# Patient Record
Sex: Male | Born: 1935 | Race: White | Hispanic: No | Smoking: Former smoker
Health system: Southern US, Community
[De-identification: ages and names within clinical notes are randomized; demographics above are authoritative.]

## PROBLEM LIST (undated history)

## (undated) DIAGNOSIS — N390 Urinary tract infection, site not specified: Secondary | ICD-10-CM

## (undated) DIAGNOSIS — L8990 Pressure ulcer of unspecified site, unspecified stage: Secondary | ICD-10-CM

## (undated) DIAGNOSIS — I10 Essential (primary) hypertension: Secondary | ICD-10-CM

## (undated) DIAGNOSIS — M6281 Muscle weakness (generalized): Secondary | ICD-10-CM

## (undated) DIAGNOSIS — C679 Malignant neoplasm of bladder, unspecified: Secondary | ICD-10-CM

## (undated) HISTORY — PX: OTHER SURGICAL HISTORY: SHX169

## (undated) HISTORY — PX: ILEAL POUCH: SHX5856

---

## 2016-05-29 ENCOUNTER — Encounter: Payer: Self-pay | Admitting: Emergency Medicine

## 2016-05-29 ENCOUNTER — Emergency Department
Admission: EM | Admit: 2016-05-29 | Discharge: 2016-05-29 | Disposition: A | Discharge status: Home / Self Care | Payer: Medicare Other | Attending: Student in an Organized Health Care Education/Training Program | Admitting: Student in an Organized Health Care Education/Training Program

## 2016-05-29 ENCOUNTER — Emergency Department: Payer: Medicare Other

## 2016-05-29 DIAGNOSIS — Z87891 Personal history of nicotine dependence: Secondary | ICD-10-CM | POA: Insufficient documentation

## 2016-05-29 DIAGNOSIS — R079 Chest pain, unspecified: Secondary | ICD-10-CM

## 2016-05-29 DIAGNOSIS — I10 Essential (primary) hypertension: Secondary | ICD-10-CM | POA: Diagnosis not present

## 2016-05-29 DIAGNOSIS — Z7982 Long term (current) use of aspirin: Secondary | ICD-10-CM | POA: Diagnosis not present

## 2016-05-29 DIAGNOSIS — R935 Abnormal findings on diagnostic imaging of other abdominal regions, including retroperitoneum: Secondary | ICD-10-CM

## 2016-05-29 DIAGNOSIS — R1011 Right upper quadrant pain: Secondary | ICD-10-CM

## 2016-05-29 DIAGNOSIS — R0789 Other chest pain: Secondary | ICD-10-CM | POA: Insufficient documentation

## 2016-05-29 DIAGNOSIS — Z79899 Other long term (current) drug therapy: Secondary | ICD-10-CM | POA: Diagnosis not present

## 2016-05-29 DIAGNOSIS — R410 Disorientation, unspecified: Secondary | ICD-10-CM | POA: Diagnosis present

## 2016-05-29 DIAGNOSIS — Z8551 Personal history of malignant neoplasm of bladder: Secondary | ICD-10-CM | POA: Diagnosis not present

## 2016-05-29 DIAGNOSIS — N390 Urinary tract infection, site not specified: Secondary | ICD-10-CM | POA: Diagnosis not present

## 2016-05-29 HISTORY — DX: Essential (primary) hypertension: I10

## 2016-05-29 HISTORY — DX: Urinary tract infection, site not specified: N39.0

## 2016-05-29 HISTORY — DX: Malignant neoplasm of bladder, unspecified: C67.9

## 2016-05-29 HISTORY — DX: Muscle weakness (generalized): M62.81

## 2016-05-29 LAB — URINALYSIS COMPLETE WITH MICROSCOPIC (ARMC ONLY)
BILIRUBIN URINE: NEGATIVE
GLUCOSE, UA: NEGATIVE mg/dL
Hgb urine dipstick: NEGATIVE
KETONES UR: NEGATIVE mg/dL
NITRITE: NEGATIVE
Protein, ur: NEGATIVE mg/dL
Specific Gravity, Urine: 1.011 (ref 1.005–1.030)
Squamous Epithelial / LPF: NONE SEEN
pH: 7 (ref 5.0–8.0)

## 2016-05-29 LAB — COMPREHENSIVE METABOLIC PANEL
ALT: 16 U/L — ABNORMAL LOW (ref 17–63)
AST: 19 U/L (ref 15–41)
Albumin: 3.7 g/dL (ref 3.5–5.0)
Alkaline Phosphatase: 58 U/L (ref 38–126)
Anion gap: 7 (ref 5–15)
BUN: 35 mg/dL — AB (ref 6–20)
CHLORIDE: 104 mmol/L (ref 101–111)
CO2: 28 mmol/L (ref 22–32)
CREATININE: 1.03 mg/dL (ref 0.61–1.24)
Calcium: 8.6 mg/dL — ABNORMAL LOW (ref 8.9–10.3)
Glucose, Bld: 132 mg/dL — ABNORMAL HIGH (ref 65–99)
POTASSIUM: 3.9 mmol/L (ref 3.5–5.1)
Sodium: 139 mmol/L (ref 135–145)
TOTAL PROTEIN: 6.7 g/dL (ref 6.5–8.1)

## 2016-05-29 LAB — TROPONIN I: Troponin I: <0.03 ng/mL (ref <0.03)

## 2016-05-29 LAB — CBC WITH DIFFERENTIAL/PLATELET
Basophils Absolute: 0.1 10*3/uL (ref 0–0.1)
Basophils Relative: 1 %
EOS PCT: 2 %
Eosinophils Absolute: 0.3 10*3/uL (ref 0–0.7)
HEMATOCRIT: 36.6 % — AB (ref 40.0–52.0)
Hemoglobin: 12.7 g/dL — ABNORMAL LOW (ref 13.0–18.0)
LYMPHS ABS: 2.5 10*3/uL (ref 1.0–3.6)
LYMPHS PCT: 22 %
MCH: 31.9 pg (ref 26.0–34.0)
MCHC: 34.6 g/dL (ref 32.0–36.0)
MCV: 92.1 fL (ref 80.0–100.0)
MONO ABS: 0.9 10*3/uL (ref 0.2–1.0)
MONOS PCT: 8 %
NEUTROS ABS: 7.7 10*3/uL — AB (ref 1.4–6.5)
Neutrophils Relative %: 67 %
PLATELETS: 210 10*3/uL (ref 150–440)
RBC: 3.97 MIL/uL — ABNORMAL LOW (ref 4.40–5.90)
RDW: 14.8 % — AB (ref 11.5–14.5)
WBC: 11.5 10*3/uL — ABNORMAL HIGH (ref 3.8–10.6)

## 2016-05-29 MED ORDER — CEFTRIAXONE SODIUM-DEXTROSE 1-3.74 GM-% IV SOLR
1.0000 g | Freq: Once | INTRAVENOUS | Status: AC
  Administered 2016-05-29: 1 g via INTRAVENOUS
  Filled 2016-05-29: qty 50

## 2016-05-29 MED ORDER — DEXTROSE 5 % IV SOLN: 1.0000 g | Freq: Once | INTRAVENOUS | Status: DC

## 2016-05-29 MED ORDER — SULFAMETHOXAZOLE-TRIMETHOPRIM 800-160 MG PO TABS
1.0000 | ORAL_TABLET | Freq: Two times a day (BID) | ORAL | 0 refills | Status: AC
Start: 2016-05-29 — End: 2016-06-05

## 2016-05-29 MED ORDER — IOPAMIDOL (ISOVUE-300) INJECTION 61%
85.0000 mL | Freq: Once | INTRAVENOUS | Status: AC | PRN
  Administered 2016-05-29: 85 mL via INTRAVENOUS

## 2016-05-29 MED ORDER — HALOPERIDOL 1 MG PO TABS
1.0000 mg | ORAL_TABLET | Freq: Once | ORAL | Status: AC
  Administered 2016-05-29: 1 mg via ORAL
  Filled 2016-05-29: qty 1

## 2016-05-29 MED ORDER — IOPAMIDOL (ISOVUE-300) INJECTION 61%
30.0000 mL | Freq: Once | INTRAVENOUS | Status: AC | PRN
  Administered 2016-05-29: 30 mL via ORAL

## 2016-05-29 NOTE — ED Provider Notes (Signed)
[facility] Emergency Department Provider Note    First MD Initiated Contact with Patient 05/29/16 [phone]     (approximate)  I have reviewed the triage vital signs and the nursing notes.   HISTORY  Chief Complaint Chest Pain  Level V Caveat:  confusion HPI Victor Parks is a 82 y.o. male  presents from nursing facility with complaint of intermittent right-sided chest pain and discomfort over the past day or 2. Patient does appear mildly confused stating that he is uncertain as to why he is in the ER. At this point he denies any discomfort right now. Denies any shortness of breath. Denies any nausea. No dysuria. No flank pain.   Past Medical History:  Diagnosis Date  . Bladder cancer ([place])   . Hypertension   . Muscle weakness   . UTI (urinary tract infection)     There are no active problems to display for this patient.   History reviewed. No pertinent surgical history.  Prior to Admission medications   Not on File    Allergies Ciprofloxacin  History reviewed. No pertinent family history.  Social History Social History  Substance Use Topics  . Smoking status: Former Research scientist (life sciences)  . Smokeless tobacco: Never Used  . Alcohol use Not on file    Review of Systems Patient denies headaches, rhinorrhea, blurry vision, numbness, shortness of breath, chest pain, edema, cough, abdominal pain, nausea, vomiting, diarrhea, dysuria, fevers, rashes or hallucinations unless otherwise stated above in HPI. ____________________________________________   PHYSICAL EXAM:  VITAL SIGNS: Vitals:   05/29/16 0521  BP: (!) 131/55  Pulse: 70  Resp: 18  Temp: 97.6 F (36.4 C)    Constitutional: Alert, Elderly and chronically ill appearing Eyes: Conjunctivae are normal. PERRL. EOMI. Head: Atraumatic. Nose: No congestion/rhinnorhea. Mouth/Throat: Mucous membranes are moist.  Oropharynx non-erythematous. Neck: No stridor. Painless ROM. No cervical spine  tenderness to palpation Hematological/Lymphatic/Immunilogical: No cervical lymphadenopathy. Cardiovascular: Normal rate, regular rhythm. Grossly normal heart sounds.  Good peripheral circulation. Respiratory: Normal respiratory effort.  No retractions. Lungs CTAB. Gastrointestinal: Soft and nontender. Urostomy bag inplace, urine appears clear, stoma appears pink and well perfused.  No distention. No abdominal bruits. No CVA tenderness. Musculoskeletal: No lower extremity tenderness nor edema.  No joint effusions. Neurologic:  Normal speech and language. No gross focal neurologic deficits are appreciated. No gait instability.  ____________________________________________   LABS (all labs ordered are listed, but only abnormal results are displayed)  Results for orders placed or performed during the hospital encounter of 05/29/16 (from the past 24 hour(s))  CBC with Differential/Platelet     Status: Abnormal   Collection Time: 05/29/16  5:29 AM  Result Value Ref Range   WBC 11.5 (H) 3.8 - 10.6 K/uL   RBC 3.97 (L) 4.40 - 5.90 MIL/uL   Hemoglobin 12.7 (L) 13.0 - 18.0 g/dL   HCT 36.6 (L) 40.0 - 52.0 %   MCV 92.1 80.0 - 100.0 fL   MCH 31.9 26.0 - 34.0 pg   MCHC 34.6 32.0 - 36.0 g/dL   RDW 14.8 (H) 11.5 - 14.5 %   Platelets 210 150 - 440 K/uL   Neutrophils Relative % 67 %   Neutro Abs 7.7 (H) 1.4 - 6.5 K/uL   Lymphocytes Relative 22 %   Lymphs Abs 2.5 1.0 - 3.6 K/uL   Monocytes Relative 8 %   Monocytes Absolute 0.9 0.2 - 1.0 K/uL   Eosinophils Relative 2 %   Eosinophils Absolute 0.3 0 - 0.7 K/uL  Basophils Relative 1 %   Basophils Absolute 0.1 0 - 0.1 K/uL  Comprehensive metabolic panel     Status: Abnormal   Collection Time: 05/29/16  5:29 AM  Result Value Ref Range   Sodium 139 135 - 145 mmol/L   Potassium 3.9 3.5 - 5.1 mmol/L   Chloride 104 101 - 111 mmol/L   CO2 28 22 - 32 mmol/L   Glucose, Bld 132 (H) 65 - 99 mg/dL   BUN 35 (H) 6 - 20 mg/dL   Creatinine, Ser 1.03 0.61 -  1.24 mg/dL   Calcium 8.6 (L) 8.9 - 10.3 mg/dL   Total Protein 6.7 6.5 - 8.1 g/dL   Albumin 3.7 3.5 - 5.0 g/dL   AST 19 15 - 41 U/L   ALT 16 (L) 17 - 63 U/L   Alkaline Phosphatase 58 38 - 126 U/L   Total Bilirubin <0.1 (L) 0.3 - 1.2 mg/dL   GFR calc non Af Amer >60 >60 mL/min   GFR calc Af Amer >60 >60 mL/min   Anion gap 7 5 - 15   ____________________________________________  EKG My review and personal interpretation at Time: 5:18   Indication: chest pain  Rate: 65  Rhythm: sinus Axis: normal Other: RBBB, no acute ST elevations or depressions ____________________________________________  RADIOLOGY  I personally reviewed all radiographic images ordered to evaluate for the above acute complaints and reviewed radiology reports and findings.  These findings were personally discussed with the patient.  Please see medical record for radiology report. ____________________________________________   PROCEDURES  Procedure(s) performed: none    Critical Care performed: no ____________________________________________   INITIAL IMPRESSION / ASSESSMENT AND PLAN / ED COURSE  Pertinent labs & imaging results that were available during my care of the patient were reviewed by me and considered in my medical decision making (see chart for details).  DDX:  Acs, cholelithiasis, hepatitis, hernia, gastritis, pna  Victor Parks is a 82 y.o. who presents to the ED with reported right upper quadrant and right-sided chest pain and discomfort. Patient arrives to the ER without any complaints. EKG shows no evidence of acute ischemia. He is otherwise in no acute distress. His abdominal exam does show evidence of urostomy with no evidence of a stomal swelling or tenderness. The stoma appears well-perfused.  Abdominal exam is nontender at this time. Based on his complaints, confusion and age with comorbidities will order bride laboratory evaluation for the above complaints.  The patient will be  placed on continuous pulse oximetry and telemetry for monitoring.  Laboratory evaluation will be sent to evaluate for the above complaints.     Clinical Course  ----------------------------------------- 8:19 AM on 05/29/2016 -----------------------------------------  CT imaging shows no evidence of acute intra-abdominal pathology. He does have multiple nodules in the liver as well as kidney but there is no evidence of obstructive process. Gallbladder is unremarkable. On reassessment the patient denies any pain or nausea. Blood work otherwise reassuring. Currently awaiting repeat troponin. As well as urinalysis. Patient will be signed out to Dr. Alfred Levins pending repeat lab results. If normal feel patient is currently stable for discharge back to facility.      ____________________________________________   FINAL CLINICAL IMPRESSION(S) / ED DIAGNOSES  Final diagnoses:  Chest pain, unspecified type  Abdominal pain, RUQ  Abnormal CT of the abdomen      NEW MEDICATIONS STARTED DURING THIS VISIT:  New Prescriptions   No medications on file     Note:  This document was prepared using  Dragon Armed forces training and education officer and may include unintentional dictation errors.    Merlyn Lot, MD 05/29/16 [phone]

## 2016-05-29 NOTE — ED Notes (Addendum)
Pt's daughter, Vaughan Basta, was informed of results of workup and notified that he would be returning to [place]. [facility] facility was called and report was given to Santiago Glad. Per Livia Snellen is unable to transport patient.

## 2016-05-29 NOTE — ED Notes (Signed)
Victor Parks (daugther) cell:  [phone]

## 2016-05-29 NOTE — ED Provider Notes (Signed)
----------------------------------------- 10:17 AM on 05/29/2016 -----------------------------------------   Blood pressure (!) 102/52, pulse 69, temperature 97.6 F (36.4 C), temperature source Oral, resp. rate 17, height 5' 9.5" (1.765 m), weight 154 lb (69.9 kg), SpO2 97 %.  Assuming care from Dr. Quentin Cornwall of [facility] is a 82 y.o. male with a chief complaint of Chest Pain .    In summary, 89M presenting with right-sided abdominal pain. Exam is reassuring with no tenderness. Blood work showing mild leukocytosis with white count of 11.3. Dr. Quentin Cornwall requests that a follow-up the results of second troponin, CT abdomen and pelvis, and urinalysis.  Second troponin remains negative. Patient has no complaints. UA is positive for urinary tract infection and with a white count of 11.5 patient was given ceftriaxone and will be discharged back to his facility on Bactrim. CT with no acute findings.   CT Abdomen Pelvis W Contrast (Final result)  Result time 05/29/16 08:05:18  Final result by Lavonia Dana, MD (05/29/16 08:05:18)           Narrative:   CLINICAL DATA: RIGHT chest, RIGHT upper quadrant, epigastric abdominal pain/ discomfort beginning last night, history of bladder cancer post cystectomy and urostomy, leukocytosis, anemia  EXAM: CT ABDOMEN AND PELVIS WITH CONTRAST  TECHNIQUE: Multidetector CT imaging of the abdomen and pelvis was performed using the standard protocol following bolus administration of intravenous contrast. Sagittal and coronal MPR images reconstructed from axial data set.  CONTRAST: 83mL ISOVUE-300 IOPAMIDOL (ISOVUE-300) INJECTION 61% IV. Dilute oral contrast.  COMPARISON: None  FINDINGS: Lower chest: Bibasilar emphysematous changes  Hepatobiliary: Contracted gallbladder. Nonspecific 5 mm low-attenuation lesion RIGHT lobe liver image 22. Remainder of liver unremarkable.  Pancreas: Normal appearance  Spleen: Normal  appearance  Adrenals/Urinary Tract: Small BILATERAL intermediate attenuation renal lesions as well as a small probable RIGHT renal cyst. Unremarkable adrenal glands. Fullness of BILATERAL renal collecting systems and ureters extending to RIGHT lower quadrant urostomy which abuts and indents the RIGHT colon. Post cystectomy.  Stomach/Bowel: Nonvisualization of appendix. Small bowel anastomosis in RIGHT mid abdomen without bowel obstruction. Increased stool in rectum. Distended stomach. Remaining bowel loops unremarkable.  Vascular/Lymphatic: Atherosclerotic calcifications aorta and iliac arteries. Plaque at origins of the celiac artery and SMA with less than 50% diameter narrowing. No adenopathy.  Reproductive: N/A  Other: No free air or free fluid.  Musculoskeletal: Diffuse osseous demineralization. Orthopedic hardware proximal RIGHT femur. Compression fractures of the superior endplates of L1 and L3, appear old. Multilevel disc space narrowing and endplate spur formation with vacuum phenomenon. RIGHT paracentral disc protrusion L5-S1 likely exerting mass effect upon the RIGHT S1 root. RIGHT neural foraminal stenosis L3-L4 with milder degrees of neural foraminal narrowing at additional levels bilaterally.  IMPRESSION: RIGHT lower quadrant urostomy without acute abnormality.  Increased stool in rectum.  Nonspecific small lesions within the liver and kidneys ; if patient has any prior outside exams these would be of benefit in establishing stability. In the absence of prior exams either MR characterization or followup imaging to assess stability would be recommended.  Question COPD.  Aortic atherosclerosis.  No definite acute intra-abdominal or intrapelvic abnormalities.  Significant degenerative disc disease changes of the lumbar spine as above with compression fractures of L1 and L3 as well as RIGHT paracentral disc herniation at L5-S1 likely exerting mass effect upon  the RIGHT S1 root.   Electronically Signed By: Lavonia Dana M.D. On: 05/29/2016 08:05                Rudene Re,  MD 05/29/16 1019

## 2016-05-29 NOTE — ED Notes (Signed)
Patient transported to CT 

## 2016-05-29 NOTE — ED Notes (Signed)
Pt having some confusion. Reports concern that his family is not here.  Per outgoing report, family is aware that patient is here and that his wife is the one to have called [phone].  Daughter, Vaughan Basta was called at this time to talk with patient and reassure him.

## 2016-05-29 NOTE — ED Notes (Signed)
Return from CT

## 2016-05-29 NOTE — ED Notes (Signed)
Report given to grace, RN

## 2016-05-29 NOTE — ED Triage Notes (Signed)
Pt presents to ED via [facility] with c/o right sided chest pain/discomfort, onset last night. Pt alerts and oriented x3 at this time. No signs of distress noted. Pt from [place].

## 2016-05-29 NOTE — ED Notes (Signed)
Pt pulling at IV. Taking off gown.  Pt redirected.

## 2016-05-29 NOTE — ED Notes (Signed)
Delay in d/c. Awaiting paperwork.

## 2016-05-29 NOTE — Discharge Instructions (Signed)
°  TECHNIQUE:  Multidetector CT imaging of the abdomen and pelvis was performed  using the standard protocol following bolus administration of  intravenous contrast. Sagittal and coronal MPR images reconstructed  from axial data set.     CONTRAST:  47mL ISOVUE-300 IOPAMIDOL (ISOVUE-300) INJECTION 61% IV.  Dilute oral contrast.     COMPARISON:  None     FINDINGS:  Lower chest: Bibasilar emphysematous changes     Hepatobiliary: Contracted gallbladder. Nonspecific 5 mm  low-attenuation lesion RIGHT lobe liver image 22. Remainder of liver  unremarkable.     Pancreas: Normal appearance     Spleen: Normal appearance     Adrenals/Urinary Tract: Small BILATERAL intermediate attenuation  renal lesions as well as a small probable RIGHT renal cyst.  Unremarkable adrenal glands. Fullness of BILATERAL renal collecting  systems and ureters extending to RIGHT lower quadrant urostomy which  abuts and indents the RIGHT colon. Post cystectomy.     Stomach/Bowel: Nonvisualization of appendix. Small bowel anastomosis  in RIGHT mid abdomen without bowel obstruction. Increased stool in  rectum. Distended stomach. Remaining bowel loops unremarkable.     Vascular/Lymphatic: Atherosclerotic calcifications aorta and iliac  arteries. Plaque at origins of the celiac artery and SMA with less  than 50% diameter narrowing. No adenopathy.     Reproductive: N/A     Other: No free air or free fluid.     Musculoskeletal: Diffuse osseous demineralization. Orthopedic  hardware proximal RIGHT femur. Compression fractures of the superior  endplates of L1 and L3, appear old. Multilevel disc space narrowing  and endplate spur formation with vacuum phenomenon. RIGHT  paracentral disc protrusion L5-S1 likely exerting mass effect upon  the RIGHT S1 root. RIGHT neural foraminal stenosis L3-L4 with milder  degrees of neural foraminal narrowing at additional levels  bilaterally.     IMPRESSION:  RIGHT lower  quadrant urostomy without acute abnormality.     Increased stool in rectum.     Nonspecific small lesions within the liver and kidneys ; if patient  has any prior outside exams these would be of benefit in  establishing stability. In the absence of prior exams either MR  characterization or followup imaging to assess stability would be  recommended.     Question COPD.     Aortic atherosclerosis.     No definite acute intra-abdominal or intrapelvic abnormalities.     Significant degenerative disc disease changes of the lumbar spine as  above with compression fractures of L1 and L3 as well as RIGHT  paracentral disc herniation at L5-S1 likely exerting mass effect  upon the RIGHT S1 root.

## 2016-05-30 LAB — URINE CULTURE

## 2016-06-13 ENCOUNTER — Inpatient Hospital Stay
Admission: EM | Admit: 2016-06-13 | Discharge: 2016-06-22 | DRG: 871 | Disposition: A | Discharge status: Skilled Nursing Facility | Payer: Medicare Other | Attending: Internal Medicine | Admitting: Internal Medicine

## 2016-06-13 ENCOUNTER — Encounter: Payer: Self-pay | Admitting: Emergency Medicine

## 2016-06-13 ENCOUNTER — Observation Stay: Payer: Medicare Other

## 2016-06-13 ENCOUNTER — Emergency Department: Payer: Medicare Other

## 2016-06-13 DIAGNOSIS — F17210 Nicotine dependence, cigarettes, uncomplicated: Secondary | ICD-10-CM | POA: Diagnosis present

## 2016-06-13 DIAGNOSIS — A4189 Other specified sepsis: Secondary | ICD-10-CM | POA: Diagnosis present

## 2016-06-13 DIAGNOSIS — J209 Acute bronchitis, unspecified: Secondary | ICD-10-CM

## 2016-06-13 DIAGNOSIS — Z79899 Other long term (current) drug therapy: Secondary | ICD-10-CM

## 2016-06-13 DIAGNOSIS — R6521 Severe sepsis with septic shock: Secondary | ICD-10-CM | POA: Diagnosis present

## 2016-06-13 DIAGNOSIS — A419 Sepsis, unspecified organism: Secondary | ICD-10-CM

## 2016-06-13 DIAGNOSIS — I10 Essential (primary) hypertension: Secondary | ICD-10-CM | POA: Diagnosis present

## 2016-06-13 DIAGNOSIS — Z66 Do not resuscitate: Secondary | ICD-10-CM | POA: Diagnosis present

## 2016-06-13 DIAGNOSIS — A4181 Sepsis due to Enterococcus: Secondary | ICD-10-CM | POA: Diagnosis not present

## 2016-06-13 DIAGNOSIS — M7989 Other specified soft tissue disorders: Secondary | ICD-10-CM

## 2016-06-13 DIAGNOSIS — R7989 Other specified abnormal findings of blood chemistry: Secondary | ICD-10-CM | POA: Diagnosis present

## 2016-06-13 DIAGNOSIS — N179 Acute kidney failure, unspecified: Secondary | ICD-10-CM | POA: Diagnosis present

## 2016-06-13 DIAGNOSIS — R710 Precipitous drop in hematocrit: Secondary | ICD-10-CM

## 2016-06-13 DIAGNOSIS — K449 Diaphragmatic hernia without obstruction or gangrene: Secondary | ICD-10-CM | POA: Diagnosis present

## 2016-06-13 DIAGNOSIS — N390 Urinary tract infection, site not specified: Secondary | ICD-10-CM | POA: Diagnosis present

## 2016-06-13 DIAGNOSIS — R109 Unspecified abdominal pain: Secondary | ICD-10-CM

## 2016-06-13 DIAGNOSIS — D62 Acute posthemorrhagic anemia: Secondary | ICD-10-CM | POA: Diagnosis not present

## 2016-06-13 DIAGNOSIS — L8990 Pressure ulcer of unspecified site, unspecified stage: Secondary | ICD-10-CM | POA: Insufficient documentation

## 2016-06-13 DIAGNOSIS — K5909 Other constipation: Secondary | ICD-10-CM | POA: Diagnosis present

## 2016-06-13 DIAGNOSIS — Z8551 Personal history of malignant neoplasm of bladder: Secondary | ICD-10-CM

## 2016-06-13 DIAGNOSIS — R0600 Dyspnea, unspecified: Secondary | ICD-10-CM | POA: Diagnosis present

## 2016-06-13 DIAGNOSIS — K567 Ileus, unspecified: Secondary | ICD-10-CM

## 2016-06-13 DIAGNOSIS — I808 Phlebitis and thrombophlebitis of other sites: Secondary | ICD-10-CM | POA: Diagnosis not present

## 2016-06-13 DIAGNOSIS — E43 Unspecified severe protein-calorie malnutrition: Secondary | ICD-10-CM | POA: Diagnosis present

## 2016-06-13 DIAGNOSIS — M6281 Muscle weakness (generalized): Secondary | ICD-10-CM

## 2016-06-13 DIAGNOSIS — Z881 Allergy status to other antibiotic agents status: Secondary | ICD-10-CM

## 2016-06-13 DIAGNOSIS — K56609 Unspecified intestinal obstruction, unspecified as to partial versus complete obstruction: Secondary | ICD-10-CM | POA: Diagnosis present

## 2016-06-13 DIAGNOSIS — K2270 Barrett's esophagus without dysplasia: Secondary | ICD-10-CM | POA: Diagnosis present

## 2016-06-13 DIAGNOSIS — Z7982 Long term (current) use of aspirin: Secondary | ICD-10-CM

## 2016-06-13 DIAGNOSIS — L89152 Pressure ulcer of sacral region, stage 2: Secondary | ICD-10-CM | POA: Diagnosis present

## 2016-06-13 DIAGNOSIS — E872 Acidosis: Secondary | ICD-10-CM | POA: Diagnosis not present

## 2016-06-13 HISTORY — DX: Pressure ulcer of unspecified site, unspecified stage: L89.90

## 2016-06-13 LAB — CBC WITH DIFFERENTIAL/PLATELET
BASOS PCT: 1 %
Basophils Absolute: 0 10*3/uL (ref 0–0.1)
Eosinophils Absolute: 0 10*3/uL (ref 0–0.7)
Eosinophils Relative: 0 %
HEMATOCRIT: 39.6 % — AB (ref 40.0–52.0)
HEMOGLOBIN: 13.3 g/dL (ref 13.0–18.0)
Lymphocytes Relative: 22 %
Lymphs Abs: 1.9 10*3/uL (ref 1.0–3.6)
MCH: 31.3 pg (ref 26.0–34.0)
MCHC: 33.6 g/dL (ref 32.0–36.0)
MCV: 93.4 fL (ref 80.0–100.0)
MONOS PCT: 11 %
Monocytes Absolute: 1 10*3/uL (ref 0.2–1.0)
NEUTROS ABS: 5.9 10*3/uL (ref 1.4–6.5)
NEUTROS PCT: 66 %
Platelets: 192 10*3/uL (ref 150–440)
RBC: 4.24 MIL/uL — AB (ref 4.40–5.90)
RDW: 14.2 % (ref 11.5–14.5)
WBC: 8.9 10*3/uL (ref 3.8–10.6)

## 2016-06-13 LAB — BASIC METABOLIC PANEL
ANION GAP: 6 (ref 5–15)
BUN: 33 mg/dL — ABNORMAL HIGH (ref 6–20)
CHLORIDE: 101 mmol/L (ref 101–111)
CO2: 31 mmol/L (ref 22–32)
Calcium: 8.6 mg/dL — ABNORMAL LOW (ref 8.9–10.3)
Creatinine, Ser: 1.3 mg/dL — ABNORMAL HIGH (ref 0.61–1.24)
GFR calc non Af Amer: 47 mL/min — ABNORMAL LOW (ref >60)
GFR, EST AFRICAN AMERICAN: 54 mL/min — AB (ref >60)
Glucose, Bld: 112 mg/dL — ABNORMAL HIGH (ref 65–99)
POTASSIUM: 4.1 mmol/L (ref 3.5–5.1)
SODIUM: 138 mmol/L (ref 135–145)

## 2016-06-13 LAB — TROPONIN I

## 2016-06-13 MED ORDER — METHYLPREDNISOLONE SODIUM SUCC 125 MG IJ SOLR
60.0000 mg | Freq: Four times a day (QID) | INTRAMUSCULAR | Status: DC
  Administered 2016-06-13 – 2016-06-15 (×8): 60 mg via INTRAVENOUS
  Filled 2016-06-13 (×8): qty 2

## 2016-06-13 MED ORDER — POTASSIUM CITRATE ER 10 MEQ (1080 MG) PO TBCR
10.0000 meq | EXTENDED_RELEASE_TABLET | Freq: Two times a day (BID) | ORAL | Status: DC
  Administered 2016-06-13 – 2016-06-22 (×16): 10 meq via ORAL
  Filled 2016-06-13 (×18): qty 1

## 2016-06-13 MED ORDER — SODIUM CHLORIDE 0.9% FLUSH: 3.0000 mL | INTRAVENOUS | Status: DC | PRN

## 2016-06-13 MED ORDER — ONDANSETRON HCL 4 MG PO TABS: 4.0000 mg | ORAL_TABLET | Freq: Four times a day (QID) | ORAL | Status: DC | PRN

## 2016-06-13 MED ORDER — ENOXAPARIN SODIUM 40 MG/0.4ML ~~LOC~~ SOLN
40.0000 mg | SUBCUTANEOUS | Status: DC
  Administered 2016-06-13 – 2016-06-14 (×2): 40 mg via SUBCUTANEOUS
  Filled 2016-06-13 (×2): qty 0.4

## 2016-06-13 MED ORDER — VITAMIN B-12 1000 MCG PO TABS
1000.0000 ug | ORAL_TABLET | Freq: Every day | ORAL | Status: DC
  Administered 2016-06-13 – 2016-06-17 (×5): 1000 ug via ORAL
  Filled 2016-06-13 (×5): qty 1

## 2016-06-13 MED ORDER — AZITHROMYCIN 250 MG PO TABS
250.0000 mg | ORAL_TABLET | Freq: Every day | ORAL | Status: DC
  Administered 2016-06-15 – 2016-06-16 (×2): 250 mg via ORAL
  Filled 2016-06-13 (×2): qty 1

## 2016-06-13 MED ORDER — IPRATROPIUM-ALBUTEROL 0.5-2.5 (3) MG/3ML IN SOLN
3.0000 mL | Freq: Four times a day (QID) | RESPIRATORY_TRACT | Status: DC
  Administered 2016-06-13 – 2016-06-14 (×5): 3 mL via RESPIRATORY_TRACT
  Filled 2016-06-13 (×6): qty 3

## 2016-06-13 MED ORDER — MORPHINE SULFATE (PF) 4 MG/ML IV SOLN
2.0000 mg | Freq: Once | INTRAVENOUS | Status: AC
  Administered 2016-06-13: 2 mg via INTRAVENOUS
  Filled 2016-06-13: qty 1

## 2016-06-13 MED ORDER — SODIUM CHLORIDE 0.9 % IV SOLN
INTRAVENOUS | Status: AC
  Administered 2016-06-13: 17:00:00 via INTRAVENOUS

## 2016-06-13 MED ORDER — METHYLPREDNISOLONE SODIUM SUCC 125 MG IJ SOLR
125.0000 mg | Freq: Four times a day (QID) | INTRAMUSCULAR | Status: DC
  Administered 2016-06-13: 125 mg via INTRAVENOUS
  Filled 2016-06-13: qty 2

## 2016-06-13 MED ORDER — SODIUM CHLORIDE 0.9 % IV SOLN: 250.0000 mL | INTRAVENOUS | Status: DC | PRN

## 2016-06-13 MED ORDER — IPRATROPIUM-ALBUTEROL 0.5-2.5 (3) MG/3ML IN SOLN
9.0000 mL | Freq: Once | RESPIRATORY_TRACT | Status: AC
  Administered 2016-06-13: 9 mL via RESPIRATORY_TRACT
  Filled 2016-06-13: qty 9

## 2016-06-13 MED ORDER — SODIUM CHLORIDE 0.9% FLUSH
3.0000 mL | Freq: Two times a day (BID) | INTRAVENOUS | Status: DC
  Administered 2016-06-13 – 2016-06-21 (×14): 3 mL via INTRAVENOUS

## 2016-06-13 MED ORDER — ASPIRIN EC 81 MG PO TBEC: 81.0000 mg | DELAYED_RELEASE_TABLET | Freq: Once | ORAL | Status: DC

## 2016-06-13 MED ORDER — GUAIFENESIN-DM 100-10 MG/5ML PO SYRP: 5.0000 mL | ORAL_SOLUTION | ORAL | Status: DC | PRN

## 2016-06-13 MED ORDER — ACETAMINOPHEN 325 MG PO TABS
650.0000 mg | ORAL_TABLET | Freq: Four times a day (QID) | ORAL | Status: DC | PRN
  Administered 2016-06-21: 650 mg via ORAL
  Filled 2016-06-13: qty 2

## 2016-06-13 MED ORDER — BOOST PO LIQD
1.0000 | Freq: Every day | ORAL | Status: DC
  Administered 2016-06-13 – 2016-06-14 (×2): 237 mL via ORAL
  Filled 2016-06-13: qty 237

## 2016-06-13 MED ORDER — SENNOSIDES-DOCUSATE SODIUM 8.6-50 MG PO TABS: 1.0000 | ORAL_TABLET | Freq: Every evening | ORAL | Status: DC | PRN

## 2016-06-13 MED ORDER — TRAMADOL HCL 50 MG PO TABS
50.0000 mg | ORAL_TABLET | Freq: Two times a day (BID) | ORAL | Status: DC | PRN
  Administered 2016-06-13 – 2016-06-15 (×3): 50 mg via ORAL
  Filled 2016-06-13 (×3): qty 1

## 2016-06-13 MED ORDER — VITAMIN C 500 MG PO TABS
1000.0000 mg | ORAL_TABLET | Freq: Every day | ORAL | Status: DC
  Administered 2016-06-13 – 2016-06-22 (×8): 1000 mg via ORAL
  Filled 2016-06-13 (×9): qty 2

## 2016-06-13 MED ORDER — ACETAMINOPHEN 650 MG RE SUPP: 650.0000 mg | Freq: Four times a day (QID) | RECTAL | Status: DC | PRN

## 2016-06-13 MED ORDER — CALCIUM CARBONATE-VITAMIN D 500-200 MG-UNIT PO TABS
2.0000 | ORAL_TABLET | Freq: Every day | ORAL | Status: DC
  Administered 2016-06-13 – 2016-06-22 (×9): 2 via ORAL
  Filled 2016-06-13 (×9): qty 2

## 2016-06-13 MED ORDER — ONDANSETRON HCL 4 MG/2ML IJ SOLN
4.0000 mg | Freq: Four times a day (QID) | INTRAMUSCULAR | Status: DC | PRN
  Administered 2016-06-13 – 2016-06-14 (×2): 4 mg via INTRAVENOUS
  Filled 2016-06-13 (×2): qty 2

## 2016-06-13 MED ORDER — DEXTROSE 5 % IV SOLN
500.0000 mg | Freq: Once | INTRAVENOUS | Status: AC
  Administered 2016-06-13: 500 mg via INTRAVENOUS
  Filled 2016-06-13: qty 500

## 2016-06-13 MED ORDER — ALBUTEROL SULFATE (2.5 MG/3ML) 0.083% IN NEBU
2.5000 mg | INHALATION_SOLUTION | Freq: Once | RESPIRATORY_TRACT | Status: AC
  Administered 2016-06-13: 2.5 mg via RESPIRATORY_TRACT
  Filled 2016-06-13: qty 3

## 2016-06-13 MED ORDER — ADULT MULTIVITAMIN W/MINERALS CH
1.0000 | ORAL_TABLET | Freq: Every day | ORAL | Status: DC
  Administered 2016-06-13 – 2016-06-22 (×8): 1 via ORAL
  Filled 2016-06-13 (×8): qty 1

## 2016-06-13 MED ORDER — AZITHROMYCIN 500 MG PO TABS
500.0000 mg | ORAL_TABLET | Freq: Every day | ORAL | Status: AC
  Administered 2016-06-14: 500 mg via ORAL
  Filled 2016-06-13: qty 1

## 2016-06-13 MED ORDER — METHYLPREDNISOLONE SODIUM SUCC 125 MG IJ SOLR
125.0000 mg | Freq: Once | INTRAMUSCULAR | Status: AC
  Administered 2016-06-13: 125 mg via INTRAVENOUS
  Filled 2016-06-13: qty 2

## 2016-06-13 MED ORDER — CALCIUM CARB-CHOLECALCIFEROL 1000-800 MG-UNIT PO TABS: 1.0000 | ORAL_TABLET | Freq: Every day | ORAL | Status: DC

## 2016-06-13 MED ORDER — CHOLESTYRAMINE LIGHT 4 G PO PACK
4.0000 g | PACK | Freq: Every day | ORAL | Status: DC
  Administered 2016-06-13 – 2016-06-17 (×5): 4 g via ORAL
  Filled 2016-06-13 (×5): qty 1

## 2016-06-13 MED ORDER — SODIUM CHLORIDE 0.9 % IV BOLUS (SEPSIS)
500.0000 mL | Freq: Once | INTRAVENOUS | Status: AC
  Administered 2016-06-13: 500 mL via INTRAVENOUS

## 2016-06-13 MED ORDER — SIMETHICONE 80 MG PO CHEW
80.0000 mg | CHEWABLE_TABLET | Freq: Four times a day (QID) | ORAL | Status: DC | PRN
  Administered 2016-06-19 – 2016-06-21 (×3): 80 mg via ORAL
  Filled 2016-06-13 (×3): qty 1

## 2016-06-13 NOTE — Consult Note (Signed)
[facility] Nurse wound consult note Reason for Consult:Moisture associated skin damage, incontinence to bilateral gluteal folds Wound type:MASD Pressure Ulcer POA: Yes Measurement: 4 cm x 1.5 cm x 0.1 cm to bilateral gluteal folds.  Has urostomy, incontinent of stool, wears disposable brief most of the time, per wife, at bedside.  Wound [id number] and moist Drainage (amount, consistency, odor) None noted Periwound:Intact Dressing procedure/placement/frequency:Cleanse buttocks with soap and water daily and PRN soilage.  No disposable briefs or underpads.  Barrier cream twice daily.   [facility] Nurse ostomy consult note Stoma type/location: LLQ urostomy Stomal assessment/size: 1" round, pink and moist Peristomal assessment: Intact.  Will add barrier ring to promote seal.   Treatment options for stomal/peristomal skin: Barrier ring Output Clear yellow urine Ostomy pouching: 2pc. 2 1/4" pouch with barrier ring.  Will connect to bedside drainage bag.   Education provided: None Enrolled patient in [place] program: No Will not follow at this time.  Please re-consult if needed.  Domenic Moras RN BSN [facility] Pager [phone]

## 2016-06-13 NOTE — Care Management Obs Status (Signed)
[facility] NOTIFICATION   Patient Details  Name: Victor Parks MRN: [id number] Date of Birth: 1923/09/06   Medicare Observation Status Notification Given:  Yes (obs letter reviewed verbally with patient's daughter.  Patient unable to sign.  Copy left in room for daughter when she returns)    Beverly Sessions, RN 06/13/2016, 3:02 PM

## 2016-06-13 NOTE — Progress Notes (Signed)
[facility] [place] NAME: Victor Parks    MR#:  [id number]  DATE OF BIRTH:  01/27/1924  SUBJECTIVE:  CHIEF COMPLAINT:  Patient is still coughing and reporting abdominal pain when he coughs. Denies any chest pain wife at bedside  REVIEW OF SYSTEMS:  CONSTITUTIONAL: No fever, fatigue or weakness.  EYES: No blurred or double vision.  EARS, NOSE, AND THROAT: No tinnitus or ear pain.  RESPIRATORY: Has dry cough, denies shortness of breath, wheezing or hemoptysis.  CARDIOVASCULAR: No chest pain, orthopnea, edema.  GASTROINTESTINAL: No nausea, vomiting, diarrhea. Reporting diffuse abdominal pain.  GENITOURINARY: No dysuria, hematuria.  ENDOCRINE: No polyuria, nocturia,  HEMATOLOGY: No anemia, easy bruising or bleeding SKIN: No rash or lesion. MUSCULOSKELETAL: No joint pain or arthritis.   NEUROLOGIC: No tingling, numbness, weakness.  PSYCHIATRY: No anxiety or depression.   DRUG ALLERGIES:   Allergies  Allergen Reactions  . Ciprofloxacin Anaphylaxis    VITALS:  Blood pressure (!) 125/53, pulse 88, temperature 98.3 F (36.8 C), temperature source Oral, resp. rate 16, height 5\' 9"  (1.753 m), weight 69.9 kg (154 lb), SpO2 97 %.  PHYSICAL EXAMINATION:  GENERAL:  82 y.o.-year-old patient lying in the bed with no acute distress.  EYES: Pupils equal, round, reactive to light and accommodation. No scleral icterus. Extraocular muscles intact.  HEENT: Head atraumatic, normocephalic. Oropharynx and nasopharynx clear.  NECK:  Supple, no jugular venous distention. No thyroid enlargement, no tenderness.  LUNGS: Normal breath sounds bilaterally, no wheezing, rales,rhonchi or crepitation. No use of accessory muscles of respiration.  CARDIOVASCULAR: S1, S2 normal. No murmurs, rubs, or gallops.  ABDOMEN: Soft, nontender, nondistended. Bowel sounds present. No organomegaly or mass.  EXTREMITIES: No pedal edema, cyanosis, or clubbing.   NEUROLOGIC: Cranial nerves II through XII are intact. Muscle strength 5/5 in all extremities. Sensation intact. Gait not checked.  PSYCHIATRIC: The patient is alert and oriented x 3.  SKIN: No obvious rash, lesion, or ulcer.    LABORATORY PANEL:   CBC  Recent Labs Lab 06/13/16 0254  WBC 8.9  HGB 13.3  HCT 39.6*  PLT 192   ------------------------------------------------------------------------------------------------------------------  Chemistries   Recent Labs Lab 06/13/16 0254  NA 138  K 4.1  CL 101  CO2 31  GLUCOSE 112*  BUN 33*  CREATININE 1.30*  CALCIUM 8.6*   ------------------------------------------------------------------------------------------------------------------  Cardiac Enzymes  Recent Labs Lab 06/13/16 0254  TROPONINI <0.03   ------------------------------------------------------------------------------------------------------------------  RADIOLOGY:  Dg Chest 1 View  Result Date: 06/13/2016 CLINICAL DATA:  Acute onset dyspnea tonight. EXAM: CHEST 1 VIEW COMPARISON:  05/29/2016 FINDINGS: There is stable mild left hemidiaphragm elevation and minimal linear basilar scarring or atelectasis. No confluent airspace consolidation. No effusions. Normal pulmonary vasculature. Hilar, mediastinal and cardiac contours are unremarkable and unchanged. IMPRESSION: No active disease. Electronically Signed   By: Andreas Newport M.D.   On: 06/13/2016 03:14    EKG:   Orders placed or performed during the hospital encounter of 06/13/16  . EKG 12-Lead  . EKG 12-Lead    ASSESSMENT AND PLAN:   82 year old elderly male patient with history of bladder cancer, hypertension, decubitus ulcer, UTI presented to the emergency room with cough, shortness of breath and wheezing.  Assessment and plan  1. Acute bronchitis causing dyspnea and wheezing  Continue Solu-Medrol at a lower dose, nebulizer treatments Start patient on azithromycin Symptomatically  treatment  2. Acute kidney injury Monitor renal function. IV fluids, avoid nephrotoxins  3. Abdominal pain Pain management  as needed Last bowel movement was yesterday Abdominal x-ray 2 views  4.. Decubitus ulcer-reposition patient every hours  5. Hypertension-stable. Continue close monitoring     All the records are reviewed and case discussed with Care Management/Social Workerr. Management plans discussed with the patient, wife and they are in agreement.  CODE STATUS: DNR  TOTAL TIME TAKING CARE OF THIS PATIENT: 36 minutes.   POSSIBLE D/C IN 2 DAYS, DEPENDING ON CLINICAL CONDITION.  Note: This dictation was prepared with Dragon dictation along with smaller phrase technology. Any transcriptional errors that result from this process are unintentional.   Nicholes Mango M.D on 06/13/2016 at 3:43 PM  Between 7am to 6pm - Pager - [phone] After 6pm go to [email or web address] - password [email or web address] [facility]  [facility]  Office  [phone]  CC: Primary care physician; [name], MD

## 2016-06-13 NOTE — ED Provider Notes (Signed)
[facility] Emergency Department Provider Note  ____________________________________________   None    (approximate)  I have reviewed the triage vital signs and the nursing notes.   HISTORY  Chief Complaint Cough   HPI Victor Parks is a 82 y.o. male with a history of hypertension as well as UTI who is presenting to the emergency department today with 2 days of shortness of breath and chest pain. At this time the patient is denying any chest pain. However, he has been noted to have a cough with shortness of breath. The patient has no complaints at this time. Patient does not wear home oxygen. En route, EMS noticed decreased breath sounds to the left field.   Past Medical History:  Diagnosis Date  . Bladder cancer ([place])   . Hypertension   . Muscle weakness   . UTI (urinary tract infection)     There are no active problems to display for this patient.   History reviewed. No pertinent surgical history.  Prior to Admission medications   Medication Sig Start Date End Date Taking? Authorizing Provider  acetaminophen (TYLENOL) 325 MG tablet Take 650 mg by mouth every 6 (six) hours as needed.    Historical Provider, MD  Ascorbic Acid (VITAMIN C) 1000 MG tablet Take 1,000 mg by mouth daily.    Historical Provider, MD  aspirin EC 81 MG tablet Take 81 mg by mouth once. tuesday    Historical Provider, MD  Calcium Carb-Cholecalciferol (CALCIUM 1000 + D) 1000-800 MG-UNIT TABS Take 1 tablet by mouth daily.    Historical Provider, MD  cholestyramine light (PREVALITE) 4 g packet Take 4 g by mouth daily.    Historical Provider, MD  Fluticasone-Salmeterol (ADVAIR) 250-50 MCG/DOSE AEPB Inhale 1 puff into the lungs 2 (two) times daily.    Historical Provider, MD  lactose free nutrition (BOOST) LIQD Take 1 Container by mouth daily.    Historical Provider, MD  loperamide (IMODIUM A-D) 2 MG tablet Take 2 mg by mouth every 6 (six) hours as needed for diarrhea or loose  stools.    Historical Provider, MD  Menthol-Zinc Oxide (CALMOSEPTINE) 0.44-20.6 % OINT Apply 1 application topically every 12 (twelve) hours as needed. To buttocks for rash    Historical Provider, MD  Multiple Vitamin (DAILY VITE) TABS Take 1 tablet by mouth daily.    Historical Provider, MD  potassium citrate (UROCIT-K) 10 MEQ (1080 MG) SR tablet Take 10 mEq by mouth 2 (two) times daily.    Historical Provider, MD  simethicone (MYLICON) 80 MG chewable tablet Chew 80 mg by mouth every 6 (six) hours as needed for flatulence.    Historical Provider, MD  traMADol (ULTRAM) 50 MG tablet Take 50 mg by mouth every 12 (twelve) hours as needed.    Historical Provider, MD  vitamin B-12 (CYANOCOBALAMIN) 1000 MCG tablet Take 1,000 mcg by mouth daily.    Historical Provider, MD    Allergies Ciprofloxacin  History reviewed. No pertinent family history.  Social History Social History  Substance Use Topics  . Smoking status: Former Research scientist (life sciences)  . Smokeless tobacco: Never Used  . Alcohol use Not on file    Review of Systems Constitutional: No fever/chills Eyes: No visual changes. ENT: No sore throat. Cardiovascular: As above Respiratory: As above Gastrointestinal: No abdominal pain.  No nausea, no vomiting.  No diarrhea.  No constipation. Genitourinary: Negative for dysuria. Musculoskeletal: Negative for back pain. Skin: Negative for rash. Neurological: Negative for headaches, focal weakness or numbness.  10-point ROS otherwise negative.  ____________________________________________   PHYSICAL EXAM:  VITAL SIGNS: ED Triage Vitals  Enc Vitals Group     BP 06/13/16 0243 104/63     Pulse Rate 06/13/16 0243 89     Resp 06/13/16 0243 (!) 22     Temp 06/13/16 0243 98.1 F (36.7 C)     Temp Source 06/13/16 0243 Oral     SpO2 06/13/16 0243 96 %     Weight 06/13/16 0247 154 lb (69.9 kg)     Height 06/13/16 0247 5\' 9"  (1.753 m)     Head Circumference --      Peak Flow --      Pain Score  06/13/16 0247 0     Pain Loc --      Pain Edu? --      Excl. in [place]? --     Constitutional: Alert and oriented. Well appearing and in no acute distress.Audible coarse cough. Eyes: Conjunctivae are normal. PERRL. EOMI. Head: Atraumatic. Nose: No congestion/rhinnorhea. Mouth/Throat: Mucous membranes are moist.   Neck: No stridor.   Cardiovascular: Normal rate, regular rhythm. Grossly normal heart sounds.   Respiratory: Normal respiratory effort.  No retractions. Coarse wheezing to all fields. Gastrointestinal: Soft and nontender. No distention. Urostomy in place to the right lower abdomen. Yellow urine in the bag. Musculoskeletal: No lower extremity tenderness nor edema.  No joint effusions. Neurologic:  Normal speech and language. No gross focal neurologic deficits are appreciated.  Skin:  Skin is warm, dry and intact. No rash noted. Psychiatric: Mood and affect are normal. Speech and behavior are normal.  ____________________________________________   LABS (all labs ordered are listed, but only abnormal results are displayed)  Labs Reviewed  CBC WITH DIFFERENTIAL/PLATELET - Abnormal; Notable for the following:       Result Value   RBC 4.24 (*)    HCT 39.6 (*)    All other components within normal limits  BASIC METABOLIC PANEL - Abnormal; Notable for the following:    Glucose, Bld 112 (*)    BUN 33 (*)    Creatinine, Ser 1.30 (*)    Calcium 8.6 (*)    GFR calc non Af Amer 47 (*)    GFR calc Af Amer 54 (*)    All other components within normal limits  TROPONIN I   ____________________________________________  EKG  ED ECG REPORT I, Doran Stabler, the attending physician, personally viewed and interpreted this ECG.   Date: 06/13/2016  EKG Time: 0249  Rate: 79  Rhythm: normal sinus rhythm  Axis: Normal  Intervals:nonspecific intraventricular conduction delay  ST&T Change: T wave inversions in 1 as well as aVL. No ST elevation or  depression.  ____________________________________________  RADIOLOGY  DG Chest 1 View (Accession [id number]) (Order [id number])  Imaging  Date: 06/13/2016 Department: [facility] EMERGENCY DEPARTMENT Released By/Authorizing: Orbie Pyo, MD (auto-released)  Exam Information   Status Exam Begun  Exam Ended   Final [99] 06/13/2016 3:11 AM 06/13/2016 3:12 AM  PACS Images   Show images for DG Chest 1 View  Study Result   CLINICAL DATA:  Acute onset dyspnea tonight.  EXAM: CHEST 1 VIEW  COMPARISON:  05/29/2016  FINDINGS: There is stable mild left hemidiaphragm elevation and minimal linear basilar scarring or atelectasis. No confluent airspace consolidation. No effusions. Normal pulmonary vasculature. Hilar, mediastinal and cardiac contours are unremarkable and unchanged.  IMPRESSION: No active disease.   Electronically Signed   By: Andreas Newport  M.D.   On: 06/13/2016 03:14    ____________________________________________   PROCEDURES  Procedure(s) performed:   Procedures  Critical Care performed:   ____________________________________________   INITIAL IMPRESSION / ASSESSMENT AND PLAN / ED COURSE  Pertinent labs & imaging results that were available during my care of the patient were reviewed by me and considered in my medical decision making (see chart for details).    Clinical Course   ----------------------------------------- 4:04 AM on 06/13/2016 -----------------------------------------  Patient not tolerating the DuoNeb times. Finished about half of the solution. Still with wheezing and tachypnea. Plan to admit the patient to the hospital for continued treatment for likely bronchitis with wheezing and tachypnea. Patient remains without any chest pain.  Signed out to Dr. Estanislado Pandy.   Also with blood pressure in the 90s but last blood pressure is [id number] systolic. We'll give 500 cc  bolus.  ____________________________________________   FINAL CLINICAL IMPRESSION(S) / ED DIAGNOSES  Bronchitis. Wheezing. Chest pain.    NEW MEDICATIONS STARTED DURING THIS VISIT:  New Prescriptions   No medications on file     Note:  This document was prepared using Dragon voice recognition software and may include unintentional dictation errors.    Orbie Pyo, MD 06/13/16 [phone]

## 2016-06-13 NOTE — ED Notes (Signed)
Nurse stopped breathing treatment since patient was unable to tolerate the breathing treatment. MD made aware.

## 2016-06-13 NOTE — ED Notes (Signed)
Nurse turned breathing treatment back on and patient was able to tolerate almost all of it.

## 2016-06-13 NOTE — Clinical Social Work Note (Signed)
Clinical Social Work Assessment  Patient Details  Name: Victor Parks MRN: [id number] Date of Birth: 01-14-1924  Date of referral:  06/13/16               Reason for consult:  Facility Placement                Permission sought to share information with:  Facility Sport and exercise psychologist, Family Supports Permission granted to share information::  Yes, Verbal Permission Granted  Name::     [name], [name] [phone]   Agency::  ALF admissions  Relationship::     Contact Information:     Housing/Transportation Living arrangements for the past 2 months:  [place] of Information:  Spouse, Other (Comment Required) Teacher, adult education) Patient Interpreter Needed:  None Criminal Activity/Legal Involvement Pertinent to Current Situation/Hospitalization:  No - Comment as needed Significant Relationships:  Adult Children, Other Family Members Lives with:  Facility Resident Do you feel safe going back to the place where you live?  Yes Need for family participation in patient care:  Yes (Comment)  Care giving concerns:  Patient is from [facility] and lives with his wife at ALF, patient and family do not have any concerns about him returning back.   Social Worker assessment / plan:  Patient is an 82 year old male who is married and lives with his wife at [facility].  Patient was tired and not talking very much, patient's grandson and wife were at bedside, assessment completed by speaking with family.  Patient has been at [facility] for a few years now, family and wife express they have been pleased with care that is provided.  Patient's family reports he was just walking yesterday at ALF and then was not doing well in the evening so he came to ED.  Patient's family was explained role of MSW and process of SNF placement if PT recommends patient go to SNF for short term rehab before returning back to ALF.  MSW explained to patient's  family that PT has been ordered and based on their recommendation will help with the next plan of care process.  Patient's family was explained that Nanine Means will also have to agree to have patient return back to ALF before he is able to return.  Patient and family did not express any other questions or concerns.  Employment status:  Retired Forensic scientist:  Medicare PT Recommendations:  Not assessed at this time Information / Referral to community resources:     Patient/Family's Response to care:  Patient and family agreeable to returning back to ALF.  Patient/Family's Understanding of and Emotional Response to Diagnosis, Current Treatment, and Prognosis:  Patient and family are hopeful he will be able to return back to ALF.  Emotional Assessment Appearance:  Appears stated age Attitude/Demeanor/Rapport:    Affect (typically observed):    Orientation:  Oriented to Self, Oriented to Situation, Oriented to Place Alcohol / Substance use:  Not Applicable Psych involvement (Current and /or in the community):  No (Comment)  Discharge Needs  Concerns to be addressed:  No discharge needs identified Readmission within the last 30 days:  No Current discharge risk:  None Barriers to Discharge:  Continued Medical Work up   Ross Ludwig 06/13/2016, 5:21 PM

## 2016-06-13 NOTE — Clinical Social Work Note (Signed)
MSW was informed that patient is from [place] and lives with his wife at facility.  Patient and family are hoping he can return back to ALF and will not have to go to SNF for short term rehab.  MSW to continue to follow patient's progress throughout discharge planning.  Jones Broom. Norval Morton, MSW [phone]  Mon-Fri 8a-4:30p 06/13/2016 4:29 PM

## 2016-06-13 NOTE — Progress Notes (Addendum)
Pt is a very poor historian and unable to answer most of the admission questions. Spoke to the daughter and she and the wife will be in later this morning to complete the admission questions. Villa Herb RN-BC

## 2016-06-13 NOTE — ED Notes (Signed)
Patient has a stage 2 pressure ulcer on his sacrum, measuring 4cm X 3cm. After cleaning his skin and applying a thick coating of moisture barrier ointment, applied a mepilex (sacral dressing).

## 2016-06-13 NOTE — H&P (Signed)
[place] [facility] [place] NAME: Victor Parks    MR#:  [id number]  DATE OF BIRTH:  Aug 01, 1923  DATE OF ADMISSION:  06/13/2016  PRIMARY CARE PHYSICIAN: Victor Eddy, MD   REQUESTING/REFERRING PHYSICIAN:   CHIEF COMPLAINT:   Chief Complaint  Patient presents with  . Cough    HISTORY OF PRESENT ILLNESS: Victor Parks  is a 82 y.o. male with a known history of Hypertension, bladder cancer, decubitus ulcer is a resident of assisted living facility. He was referred to emergency room for difficulty breathing and cough. Cough is dry in nature and patient had wheeze in both the lung fields when he presented to the emergency room. He was given breathing treatments aggressively in the emergency room. Patient does not have any history of bronchial asthma or COPD. Was also given Zithromax antibiotic for bronchitis. No complaints of any chest pain. Patient felt more anxious when he caught the breathing treatment. Patient also has stage II pressure ulcer on the sacrum measuring 4/ 3 cm. No history of recent travel or sick contacts at the facility. Chest x-ray did not reveal any pneumonia and WBC count was normal and the blood. Hospitalist service was consulted for further care of the patient.  PAST MEDICAL HISTORY:   Past Medical History:  Diagnosis Date  . Bladder cancer ([place])   . Decubitus ulcer   . Hypertension   . Muscle weakness   . UTI (urinary tract infection)     PAST SURGICAL HISTORY: Past Surgical History:  Procedure Laterality Date  . none      SOCIAL HISTORY:  Social History  Substance Use Topics  . Smoking status: Current Some Day Smoker  . Smokeless tobacco: Never Used  . Alcohol use Yes    FAMILY HISTORY: History reviewed. No pertinent family history.  DRUG ALLERGIES:  Allergies  Allergen Reactions  . Ciprofloxacin Anaphylaxis    REVIEW OF SYSTEMS:   CONSTITUTIONAL: No fever, fatigue or weakness.  EYES: No  blurred or double vision.  EARS, NOSE, AND THROAT: No tinnitus or ear pain.  RESPIRATORY: Has cough, shortness of breath, wheezing  No hemoptysis.  CARDIOVASCULAR: No chest pain, orthopnea, edema.  GASTROINTESTINAL: No nausea, vomiting, diarrhea or abdominal pain.  GENITOURINARY: No dysuria, hematuria.  ENDOCRINE: No polyuria, nocturia,  HEMATOLOGY: No anemia, easy bruising or bleeding SKIN: No rash or lesion. MUSCULOSKELETAL: No joint pain or arthritis.   NEUROLOGIC: No tingling, numbness, weakness.  PSYCHIATRY: No anxiety or depression.   MEDICATIONS AT HOME:  Prior to Admission medications   Medication Sig Start Date End Date Taking? Authorizing Provider  acetaminophen (TYLENOL) 325 MG tablet Take 650 mg by mouth every 6 (six) hours as needed.   Yes Historical Provider, MD  Ascorbic Acid (VITAMIN C) 1000 MG tablet Take 1,000 mg by mouth daily.   Yes Historical Provider, MD  aspirin EC 81 MG tablet Take 81 mg by mouth once. tuesday   Yes Historical Provider, MD  Calcium Carb-Cholecalciferol (CALCIUM 1000 + D) 1000-800 MG-UNIT TABS Take 1 tablet by mouth daily.   Yes Historical Provider, MD  cholestyramine light (PREVALITE) 4 g packet Take 4 g by mouth daily.   Yes Historical Provider, MD  Fluticasone-Salmeterol (ADVAIR) 250-50 MCG/DOSE AEPB Inhale 1 puff into the lungs 2 (two) times daily.   Yes Historical Provider, MD  lactose free nutrition (BOOST) LIQD Take 1 Container by mouth daily.   Yes Historical Provider, MD  loperamide (IMODIUM A-D) 2 MG tablet Take  2 mg by mouth every 6 (six) hours as needed for diarrhea or loose stools.   Yes Historical Provider, MD  Menthol-Zinc Oxide (CALMOSEPTINE) 0.44-20.6 % OINT Apply 1 application topically every 12 (twelve) hours as needed. To buttocks for rash   Yes Historical Provider, MD  Multiple Vitamin (DAILY VITE) TABS Take 1 tablet by mouth daily.   Yes Historical Provider, MD  potassium citrate (UROCIT-K) 10 MEQ (1080 MG) SR tablet Take 10 mEq  by mouth 2 (two) times daily.   Yes Historical Provider, MD  simethicone (MYLICON) 80 MG chewable tablet Chew 80 mg by mouth every 6 (six) hours as needed for flatulence.   Yes Historical Provider, MD  traMADol (ULTRAM) 50 MG tablet Take 50 mg by mouth every 12 (twelve) hours as needed.   Yes Historical Provider, MD  vitamin B-12 (CYANOCOBALAMIN) 1000 MCG tablet Take 1,000 mcg by mouth daily.   Yes Historical Provider, MD      PHYSICAL EXAMINATION:   VITAL SIGNS: Blood pressure 104/63, pulse 89, temperature 98.1 F (36.7 C), temperature source Oral, resp. rate (!) 22, height 5\' 9"  (1.753 m), weight 69.9 kg (154 lb), SpO2 96 %.  GENERAL:  83 y.o.-year-old patient lying in the bed with no acute distress.  EYES: Pupils equal, round, reactive to light and accommodation. No scleral icterus. Extraocular muscles intact.  HEENT: Head atraumatic, normocephalic. Oropharynx and nasopharynx clear.  NECK:  Supple, no jugular venous distention. No thyroid enlargement, no tenderness.  LUNGS: Decreased breath sounds bilaterally, bilateral wheezing noted, rales. No use of accessory muscles of respiration.  CARDIOVASCULAR: S1, S2 normal. No murmurs, rubs, or gallops.  ABDOMEN: Soft, nontender, nondistended. Bowel sounds present. No organomegaly or mass.  EXTREMITIES: No pedal edema, cyanosis, or clubbing.  NEUROLOGIC: Cranial nerves II through XII are intact. Muscle strength 5/5 in all extremities. Sensation intact. Gait not checked.  PSYCHIATRIC: The patient is alert and oriented x 3.  SKIN: Pressure ulcer noted 4/3 cm in sacral area.  LABORATORY PANEL:   CBC  Recent Labs Lab 06/13/16 0254  WBC 8.9  HGB 13.3  HCT 39.6*  PLT 192  MCV 93.4  MCH 31.3  MCHC 33.6  RDW 14.2  LYMPHSABS 1.9  MONOABS 1.0  EOSABS 0.0  BASOSABS 0.0   ------------------------------------------------------------------------------------------------------------------  Chemistries   Recent Labs Lab 06/13/16 0254   NA 138  K 4.1  CL 101  CO2 31  GLUCOSE 112*  BUN 33*  CREATININE 1.30*  CALCIUM 8.6*   ------------------------------------------------------------------------------------------------------------------ estimated creatinine clearance is 38.1 mL/min (by C-G formula based on SCr of 1.3 mg/dL (H)). ------------------------------------------------------------------------------------------------------------------ No results for input(s): TSH, T4TOTAL, T3FREE, THYROIDAB in the last 72 hours.  Invalid input(s): FREET3   Coagulation profile No results for input(s): INR, PROTIME in the last 168 hours. ------------------------------------------------------------------------------------------------------------------- No results for input(s): DDIMER in the last 72 hours. -------------------------------------------------------------------------------------------------------------------  Cardiac Enzymes  Recent Labs Lab 06/13/16 0254  TROPONINI <0.03   ------------------------------------------------------------------------------------------------------------------ Invalid input(s): POCBNP  ---------------------------------------------------------------------------------------------------------------  Urinalysis    Component Value Date/Time   COLORURINE STRAW (A) 05/29/2016 0821   APPEARANCEUR CLEAR (A) 05/29/2016 0821   LABSPEC 1.011 05/29/2016 0821   PHURINE 7.0 05/29/2016 0821   GLUCOSEU NEGATIVE 05/29/2016 0821   HGBUR NEGATIVE 05/29/2016 0821   BILIRUBINUR NEGATIVE 05/29/2016 0821   KETONESUR NEGATIVE 05/29/2016 0821   PROTEINUR NEGATIVE 05/29/2016 0821   NITRITE NEGATIVE 05/29/2016 0821   LEUKOCYTESUR TRACE (A) 05/29/2016 0821     RADIOLOGY: Dg Chest 1 View  Result Date: 06/13/2016 CLINICAL  DATA:  Acute onset dyspnea tonight. EXAM: CHEST 1 VIEW COMPARISON:  05/29/2016 FINDINGS: There is stable mild left hemidiaphragm elevation and minimal linear basilar scarring or  atelectasis. No confluent airspace consolidation. No effusions. Normal pulmonary vasculature. Hilar, mediastinal and cardiac contours are unremarkable and unchanged. IMPRESSION: No active disease. Electronically Signed   By: Andreas Newport M.D.   On: 06/13/2016 03:14    EKG: Orders placed or performed during the hospital encounter of 06/13/16  . EKG 12-Lead  . EKG 12-Lead    IMPRESSION AND PLAN: 82 year old elderly male patient with history of bladder cancer, hypertension, decubitus ulcer, UTI presented to the emergency room with cough, shortness of breath and wheezing. Admitting diagnosis 1. Acute dyspnea secondary to wheezing 2. Acute bronchitis 3. Decubitus ulcer 4. Hypertension Treatment plan Admit patient to medical floor observation bed Breathing treatments around-the-clock IV Solu-Medrol 125 MG every 6 hourly DVT prophylaxis subcutaneous Lovenox 40 MG daily Resume home medications Supportive care.  All the records are reviewed and case discussed with ED provider. Management plans discussed with the patient, family and they are in agreement.  CODE STATUS:DNR Code Status History    Date Active Date Inactive Code Status Order ID Comments User Context   05/29/2016  8:18 AM 05/29/2016  5:01 PM DNR [id number]  Merlyn Lot, MD ED    Questions for Most Recent Historical Code Status (Order [id number])    Question Answer Comment   In the event of cardiac or respiratory ARREST Do not call a "code blue"    In the event of cardiac or respiratory ARREST Do not perform Intubation, CPR, defibrillation or ACLS    In the event of cardiac or respiratory ARREST Use medication by any route, position, wound care, and other measures to relive pain and suffering. May use oxygen, suction and manual treatment of airway obstruction as needed for comfort.         Advance Directive Documentation   Flowsheet Row Most Recent Value  Type of Advance Directive  Out of facility DNR (pink MOST or  yellow form)  Pre-existing out of facility DNR order (yellow form or pink MOST form)  No data  "MOST" Form in Place?  No data       TOTAL TIME TAKING CARE OF THIS PATIENT: 52 minutes.    Saundra Shelling M.D on 06/13/2016 at 4:43 AM  Between 7am to 6pm - Pager - [phone]  After 6pm go to [email or web address] - password [email or web address] [facility]  [facility]  Office  [phone]  CC: Primary care physician; Victor Eddy, MD

## 2016-06-13 NOTE — Care Management (Signed)
Patient admitted from [facility]F with Acute dyspnea.  Patient lives at [place] with his wife.  CSW notified of admission.  Patient with ulcer on sacrum, and chronic urostomy.  PT consult pending.  RNCM following for discharge needs

## 2016-06-13 NOTE — ED Triage Notes (Signed)
Pt coming from [facility] via EMS for cough and chest discomfort. Recently finished antibiotics yesterday.

## 2016-06-14 LAB — CBC
HCT: 31.1 % — ABNORMAL LOW (ref 40.0–52.0)
Hemoglobin: 10.3 g/dL — ABNORMAL LOW (ref 13.0–18.0)
MCH: 31.1 pg (ref 26.0–34.0)
MCHC: 33.2 g/dL (ref 32.0–36.0)
MCV: 93.6 fL (ref 80.0–100.0)
PLATELETS: 207 10*3/uL (ref 150–440)
RBC: 3.33 MIL/uL — AB (ref 4.40–5.90)
RDW: 13.7 % (ref 11.5–14.5)
WBC: 23 10*3/uL — AB (ref 3.8–10.6)

## 2016-06-14 LAB — URINALYSIS COMPLETE WITH MICROSCOPIC (ARMC ONLY)
BILIRUBIN URINE: NEGATIVE
GLUCOSE, UA: NEGATIVE mg/dL
LEUKOCYTES UA: NEGATIVE
Nitrite: NEGATIVE
PH: 6 (ref 5.0–8.0)
Protein, ur: NEGATIVE mg/dL
Specific Gravity, Urine: 1.015 (ref 1.005–1.030)

## 2016-06-14 LAB — BASIC METABOLIC PANEL
Anion gap: 7 (ref 5–15)
BUN: 57 mg/dL — ABNORMAL HIGH (ref 6–20)
CALCIUM: 8.3 mg/dL — AB (ref 8.9–10.3)
CO2: 29 mmol/L (ref 22–32)
CREATININE: 0.99 mg/dL (ref 0.61–1.24)
Chloride: 101 mmol/L (ref 101–111)
GFR calc non Af Amer: >60 mL/min (ref >60)
Glucose, Bld: 162 mg/dL — ABNORMAL HIGH (ref 65–99)
Potassium: 4.6 mmol/L (ref 3.5–5.1)
SODIUM: 137 mmol/L (ref 135–145)

## 2016-06-14 MED ORDER — IPRATROPIUM-ALBUTEROL 0.5-2.5 (3) MG/3ML IN SOLN
3.0000 mL | Freq: Three times a day (TID) | RESPIRATORY_TRACT | Status: DC
  Administered 2016-06-15 – 2016-06-16 (×5): 3 mL via RESPIRATORY_TRACT
  Filled 2016-06-14 (×5): qty 3

## 2016-06-14 MED ORDER — BOOST / RESOURCE BREEZE PO LIQD
1.0000 | Freq: Three times a day (TID) | ORAL | Status: DC
  Administered 2016-06-14 – 2016-06-21 (×14): 1 via ORAL

## 2016-06-14 NOTE — Evaluation (Signed)
Physical Therapy Evaluation Patient Details Name: Victor Parks MRN: [id number] DOB: [birth year] Today's Date: 06/14/2016   History of Present Illness  Pt is an 82 y.o. male presenting to hospital with 2 days of SOB, chest pain, cough, and wheezing.  Pt admitted to hospital with acute dyspnea secondary to wheezing and acute bronchitis.  Imaging showing partial mid to distal SBO or ileus.  PMH includes compression fx's L1 (old fx) & L4 (new since 05/29/16 imaging), R paracentral disc herniation L5-S1 likely exerting mass effect R S1 root, htn, UTI, bladder CA.  Clinical Impression  Prior to hospital admission, pt was ambulating modified independently with RW.  Pt lives with his wife at [facility].  Currently pt is CGA with bed mobility and max assist to stand with RW.  Pt reporting not feeling well (feeling nauseas) upon standing and refused to attempt ambulation with 2 assist (pt took one step to R with cueing and then suddenly sat down quickly onto edge of bed with assist of 2 for safety).  Pt would benefit from skilled PT to address noted impairments and functional limitations.  Recommend pt discharge to STR when medically appropriate.    Follow Up Recommendations SNF    Equipment Recommendations  Rolling walker with 5" wheels    Recommendations for Other Services       Precautions / Restrictions Precautions Precautions: Fall Precaution Comments: Urostomy R LQ Restrictions Weight Bearing Restrictions: No      Mobility  Bed Mobility Overal bed mobility: Needs Assistance Bed Mobility: Rolling;Supine to Sit;Sit to Supine Rolling: Min guard   Supine to sit: Min guard;HOB elevated Sit to supine: Min guard;HOB elevated   General bed mobility comments: pt utilized logrolling in/out of bed; pt able to sit up on his own (x3 trials) but then laying back down quickly d/t not feeling well  Transfers Overall transfer level: Needs assistance Equipment used: Rolling walker (2  wheeled) Transfers: Sit to/from Stand Sit to Stand: Max assist;+2 safety/equipment         General transfer comment: assist to initiate and come to full stand; vc's for hand and feet placement  Ambulation/Gait Ambulation/Gait assistance: Min assist;Mod assist;+2 physical assistance Ambulation Distance (Feet):  (pt refused to attempt to walk or march in place once standing; pt took one step to R and then suddenly sat down requiring 2 assist to safely sit on edge of bed) Assistive device: Rolling walker (2 wheeled)          Stairs            Wheelchair Mobility    Modified Rankin (Stroke Patients Only)       Balance Overall balance assessment: Needs assistance Sitting-balance support: Bilateral upper extremity supported;Feet supported Sitting balance-Leahy Scale: Fair Sitting balance - Comments: static sitting   Standing balance support: Bilateral upper extremity supported (on RW) Standing balance-Leahy Scale: Poor Standing balance comment: posterior lean initially upon standing requiring min assist to correct but improved to CGA                             Pertinent Vitals/Pain Pain Assessment: No/denies pain  Vitals (HR and O2) stable and WFL throughout treatment session.    Home Living Family/patient expects to be discharged to:: Assisted living               Home Equipment: Gilford Rile - 2 wheels Additional Comments: Lives at [facility] with his wife.    Prior  Function Level of Independence: Needs assistance   Gait / Transfers Assistance Needed: Independent with ambulation using RW  ADL's / Homemaking Assistance Needed: Assist for meals, medications, laundry, and cleaning        Hand Dominance        Extremity/Trunk Assessment   Upper Extremity Assessment: Generalized weakness (difficult to assess d/t pt not wanting to participate in assessment)           Lower Extremity Assessment: Generalized weakness (difficult to assess  d/t pt not wanting to participate in assessment)         Communication   Communication: No difficulties  Cognition Arousal/Alertness: Awake/alert Behavior During Therapy: Impulsive Overall Cognitive Status: Difficult to assess (d/t pt's willingness to answer questions (oriented to person))       Memory: Decreased recall of precautions              General Comments General comments (skin integrity, edema, etc.): Pt laying in bed sleeping when PT entered room; pt's wife present for entire session.  Nursing cleared pt for participation in physical therapy.  Pt agreeable to PT session with encouragement of his wife.    Exercises     Assessment/Plan    PT Assessment Patient needs continued PT services  PT Problem List Decreased strength;Decreased activity tolerance;Decreased balance;Decreased mobility          PT Treatment Interventions DME instruction;Gait training;Functional mobility training;Therapeutic activities;Therapeutic exercise;Balance training;Patient/family education    PT Goals (Current goals can be found in the Care Plan section)  Acute Rehab PT Goals Patient Stated Goal: to go back to bed and rest PT Goal Formulation: With patient Time For Goal Achievement: 06/28/16 Potential to Achieve Goals: Good    Frequency Min 2X/week   Barriers to discharge Decreased caregiver support      Co-evaluation               End of Session Equipment Utilized During Treatment: Gait belt (up high d/t urostomy ) Activity Tolerance: Other (comment) (Limited d/t pt reporting not feeling well) Patient left: in bed;with call bell/phone within reach;with bed alarm set;with family/visitor present (bed in lowest position; fall mats placed on both sides of bed) Nurse Communication: Mobility status;Precautions (Pt nauseas)    Functional Assessment Tool Used: AM-PAC without stairs Functional Limitation: Mobility: Walking and moving around Mobility: Walking and Moving  Around Current Status [id number]): At least 60 percent but less than 80 percent impaired, limited or restricted Mobility: Walking and Moving Around Goal Status [phone]): At least 1 percent but less than 20 percent impaired, limited or restricted    Time: 1120-1145 PT Time Calculation (min) (ACUTE ONLY): 25 min   Charges:   PT Evaluation $PT Eval Low Complexity: 1 Procedure PT Treatments $Therapeutic Activity: 8-22 mins   PT G Codes:   PT G-Codes **NOT FOR INPATIENT CLASS** Functional Assessment Tool Used: AM-PAC without stairs Functional Limitation: Mobility: Walking and moving around Mobility: Walking and Moving Around Current Status [id number]): At least 60 percent but less than 80 percent impaired, limited or restricted Mobility: Walking and Moving Around Goal Status [phone]): At least 1 percent but less than 20 percent impaired, limited or restricted    Leitha Bleak 06/14/2016, 12:00 PM Leitha Bleak, [place]

## 2016-06-14 NOTE — Progress Notes (Signed)
Initial Nutrition Assessment  DOCUMENTATION CODES:   Severe malnutrition in context of chronic illness  INTERVENTION:  Continue Boost Plus po once daily, each supplement provides 360 kcal and 14 grams protein. Wife does not think patient will drink this, but wants to leave on order in case he wants it.  Provide Boost Breeze po TID, each supplement provides 250 kcal and 9 grams of protein.  Will also provide Magic Cup po once daily with dinner tray, each supplement provides 290 kcal and 9 grams protein.  Encouraged adequate intake of calories and protein with meals as appetite returns. Continue multivitamin with minerals daily in setting of stage II pressure ulcer.  NUTRITION DIAGNOSIS:   Increased nutrient needs related to wound healing as evidenced by estimated needs.  GOAL:   Patient will meet greater than or equal to 90% of their needs  MONITOR:   PO intake, Supplement acceptance, Labs, Weight trends, I & O's, Skin  REASON FOR ASSESSMENT:   Malnutrition Screening Tool    ASSESSMENT:   82 y.o. male with a known history of Hypertension, bladder cancer, decubitus ulcer is a resident of assisted living facility. He was referred to emergency room for difficulty breathing and cough. Found to have acute bronchitis, acute kidney injury. Abdominal x-ray revealed possible partial small bowel obstruction versus ileus with moderate amount of gaseous distention of the stomach.   Attempted to speak with patient at bedside. He is currently lethargic and did not wake to name call. Spoke with wife who reports up until today patient's appetite and intake was normal. He eats 3 meals per day (finishes 100%) and drinks 1 Boost daily at home. Reports he is tired of the Boost and may want to try other options. Denies N/V, abdominal pain, C/D, or difficulty chewing/swallowing.   Wife is unsure of UBW, but is reporting weight loss for patient. She is unsure of how much weight patient has lost and  there is no weight history in chart or Care Everywhere tab.   Meal Completion: 20-80%  Medications reviewed and include: Oscal with D 2 tablets daily, methylprednisolone 60 mg Q6hrs, multivitamin with minerals, potassium citrate 10 mEq BID, vitamin B12 1000 micrograms daily, Vitamin C 1000 mg daily.  Labs reviewed: BUN 57, Glucose.  Nutrition-Focused physical exam completed. Findings are severe fat depletion, severe muscle depletion, and no edema. Patient meets criteria for severe chronic malnutrition in setting of severe fat and muscle depletion.  Diet Order:  Diet Heart Room service appropriate? Yes; Fluid consistency: Thin  Skin:  Wound (see comment) (Stg II to coccyx)  Last BM:  Unknown  Height:   Ht Readings from Last 1 Encounters:  06/13/16 5\' 9"  (1.753 m)    Weight:   Wt Readings from Last 1 Encounters:  06/13/16 154 lb (69.9 kg)    Ideal Body Weight:  72.73 kg  BMI:  Body mass index is 22.74 kg/m.  Estimated Nutritional Needs:   Kcal:  1630-1900 (MSJ x 1.2-1.4)  Protein:  90-110 grams (1.3-1.6 grams/kg)  Fluid:  >/= 1.7 L/day (25 ml/kg)  EDUCATION NEEDS:   Education needs no appropriate at this time  Willey Blade, MS, RD, LDN Pager: [phone] After Hours Pager: [phone]

## 2016-06-14 NOTE — Progress Notes (Signed)
MD was notified of patient episode of nausea. No bm still. No new order received.

## 2016-06-14 NOTE — Progress Notes (Signed)
Patient ID: Virtus Banish, male   DOB: Apr 06, 1924, 82 y.o.   MRN: [id number]    [name] NAME: Victor Parks    MR#:  [id number]  DATE OF BIRTH:  [birth year]  SUBJECTIVE:  CHIEF COMPLAINT:   Chief Complaint  Patient presents with  . Cough  Patient seen and examined at bedside. Patient denies complaint of shortness of breath or cough. Wife states his symptoms are significantly improved. When asked about his abdominal pain he denies. Nursing documentation shows no bowel movement since November 14. Abdominal x-ray revealed possible partial small bowel obstruction versus ileus with moderate amount of gaseous distention of the stomach.  REVIEW OF SYSTEMS:  ROS  CONSTITUTIONAL: No fever, fatigue or weakness.  EYES: No blurred or double vision.  EARS, NOSE, AND THROAT: No tinnitus or ear pain.  RESPIRATORY: Has dry cough, denies shortness of breath, wheezing or hemoptysis.  CARDIOVASCULAR: No chest pain, orthopnea, edema.  GASTROINTESTINAL: No nausea, vomiting, diarrhea. Reporting diffuse abdominal pain.  GENITOURINARY: No dysuria, hematuria.  ENDOCRINE: No polyuria, nocturia,  HEMATOLOGY: No anemia, easy bruising or bleeding SKIN: No rash or lesion. MUSCULOSKELETAL: No joint pain or arthritis.   NEUROLOGIC: No tingling, numbness, weakness.  PSYCHIATRY: No anxiety or depression.   DRUG ALLERGIES:   Allergies  Allergen Reactions  . Ciprofloxacin Anaphylaxis   VITALS:  Blood pressure (!) 103/59, pulse 98, temperature 98.4 F (36.9 C), temperature source Oral, resp. rate 20, height 5\' 9"  (1.753 m), weight 69.9 kg (154 lb), SpO2 94 %. PHYSICAL EXAMINATION:  Physical Exam  GENERAL:  82 y.o.-year-old patient lying in the bed with no acute distress.  EYES: Pupils equal, round, reactive to light and accommodation. No scleral icterus. Extraocular muscles intact.  HEENT: Head atraumatic, normocephalic. Oropharynx and nasopharynx  clear.  NECK:  Supple, no jugular venous distention. No thyroid enlargement, no tenderness.  LUNGS: Normal breath sounds bilaterally, no wheezing, rales,rhonchi or crepitation. No use of accessory muscles of respiration.  CARDIOVASCULAR: S1, S2 normal. No murmurs, rubs, or gallops.  ABDOMEN: Soft, nontender, nondistended. Bowel sounds present. No organomegaly or mass.  EXTREMITIES: No pedal edema, cyanosis, or clubbing.  NEUROLOGIC: Cranial nerves II through XII are intact. Muscle strength 5/5 in all extremities. Sensation intact. Gait not checked.  PSYCHIATRIC: The patient is alert and oriented x 3.  SKIN: No obvious rash, lesion, or ulcer.   LABORATORY PANEL:   CBC  Recent Labs Lab 06/14/16 0434  WBC 23.0*  HGB 10.3*  HCT 31.1*  PLT 207   ------------------------------------------------------------------------------------------------------------------ Chemistries   Recent Labs Lab 06/14/16 0434  NA 137  K 4.6  CL 101  CO2 29  GLUCOSE 162*  BUN 57*  CREATININE 0.99  CALCIUM 8.3*   RADIOLOGY:  Dg Abd 2 Views  Result Date: 06/14/2016 CLINICAL DATA:  Cough, abdominal pain EXAM: ABDOMEN - 2 VIEW COMPARISON:  Abdominal and pelvic CT scan of May 29, 2016 FINDINGS: The colonic stool burden is moderate. There are loops of mildly distended small bowel in the midline and to the left of midline. There is distention of the stomach with fluid and gas. No free extraluminal gas collections are observed. The lung bases are clear. There are numerous surgical clips in the pelvis. The bones are osteopenic and there degenerative changes of the lumbar spine with partial compressions at L1 and L4. IMPRESSION: Findings consistent with a partial mid to distal small bowel obstruction or ileus. Moderate distention of the stomach with air  in fluid may benefit from the nasogastric suction. Partial compressions of the bodies of L1 and L4. The L1 compression is old. The L4 compression appears new  since May 29, 2016. Electronically Signed   By: David  Martinique M.D.   On: 06/14/2016 07:55   ASSESSMENT AND PLAN:   82 year old elderly male patient with history of bladder cancer, hypertension, decubitus ulcer, UTI Admitted on 06/13/2016 with  1.Acute bronchitis causing dyspnea and wheezing, improving. Continue Solu-Medrol at a lower dose, nebulizer treatments Continue azithromycin.  2. Acute kidney injury, improving Monitor renal function. IV fluids, avoid nephrotoxins  3. Abdominal pain, improving. Patient denies abdominal pain. Pain management as needed Last bowel movement was yesterday Abdominal x-ray 2 views revealed partial small bowel obstruction versus ileus with moderate amount of gaseous distention of the stomach. We'll continue to monitor with serial abdominal exams and consider CT abdomen pelvis and/or NG tube if patient complains of nausea vomiting or abdominal pain or if there is no stool or flatus.  4. Leukocytosis, significantly increased from prior hospital days. We'll obtain CBC with differential. Repeat blood and urine cultures.  4..Decubitus ulcer-reposition patient every hours  5.Hypertension-stable. Continue close monitoring   All the records are reviewed and case discussed with Care Management/Social Worker. Management plans discussed with the patient, family and they are in agreement.  CODE STATUS: DO NOT RESUSCITATE  TOTAL TIME TAKING CARE OF THIS PATIENT: 30 minutes.   More than 50% of the time was spent in counseling/coordination of care: YES  POSSIBLE D/C IN 1-2 DAYS, DEPENDING ON CLINICAL CONDITION.   Kandas Oliveto D.O. on 06/14/2016 at 12:37 PM  Between 7am to 6pm - Pager - [phone]  After 6pm go to [email or web address] - Proofreader  [facility]  Office  [phone]  CC: Primary care physician; [name], MD  Note: This dictation was prepared with Dragon dictation along with smaller  phrase technology. Any transcriptional errors that result from this process are unintentional.

## 2016-06-15 ENCOUNTER — Observation Stay: Payer: Medicare Other

## 2016-06-15 ENCOUNTER — Inpatient Hospital Stay: Payer: Medicare Other

## 2016-06-15 DIAGNOSIS — A419 Sepsis, unspecified organism: Secondary | ICD-10-CM | POA: Diagnosis not present

## 2016-06-15 DIAGNOSIS — Z881 Allergy status to other antibiotic agents status: Secondary | ICD-10-CM | POA: Diagnosis not present

## 2016-06-15 DIAGNOSIS — Z8551 Personal history of malignant neoplasm of bladder: Secondary | ICD-10-CM | POA: Diagnosis not present

## 2016-06-15 DIAGNOSIS — K449 Diaphragmatic hernia without obstruction or gangrene: Secondary | ICD-10-CM | POA: Diagnosis present

## 2016-06-15 DIAGNOSIS — L89152 Pressure ulcer of sacral region, stage 2: Secondary | ICD-10-CM | POA: Diagnosis present

## 2016-06-15 DIAGNOSIS — D62 Acute posthemorrhagic anemia: Secondary | ICD-10-CM | POA: Diagnosis not present

## 2016-06-15 DIAGNOSIS — R109 Unspecified abdominal pain: Secondary | ICD-10-CM

## 2016-06-15 DIAGNOSIS — F17210 Nicotine dependence, cigarettes, uncomplicated: Secondary | ICD-10-CM | POA: Diagnosis present

## 2016-06-15 DIAGNOSIS — R0602 Shortness of breath: Secondary | ICD-10-CM | POA: Diagnosis not present

## 2016-06-15 DIAGNOSIS — R7989 Other specified abnormal findings of blood chemistry: Secondary | ICD-10-CM | POA: Diagnosis present

## 2016-06-15 DIAGNOSIS — N390 Urinary tract infection, site not specified: Secondary | ICD-10-CM | POA: Diagnosis present

## 2016-06-15 DIAGNOSIS — K5909 Other constipation: Secondary | ICD-10-CM | POA: Diagnosis present

## 2016-06-15 DIAGNOSIS — K567 Ileus, unspecified: Secondary | ICD-10-CM | POA: Diagnosis present

## 2016-06-15 DIAGNOSIS — K2270 Barrett's esophagus without dysplasia: Secondary | ICD-10-CM | POA: Diagnosis present

## 2016-06-15 DIAGNOSIS — D649 Anemia, unspecified: Secondary | ICD-10-CM | POA: Diagnosis not present

## 2016-06-15 DIAGNOSIS — Z66 Do not resuscitate: Secondary | ICD-10-CM | POA: Diagnosis present

## 2016-06-15 DIAGNOSIS — Z79899 Other long term (current) drug therapy: Secondary | ICD-10-CM | POA: Diagnosis not present

## 2016-06-15 DIAGNOSIS — K56609 Unspecified intestinal obstruction, unspecified as to partial versus complete obstruction: Secondary | ICD-10-CM | POA: Diagnosis present

## 2016-06-15 DIAGNOSIS — D72829 Elevated white blood cell count, unspecified: Secondary | ICD-10-CM | POA: Diagnosis not present

## 2016-06-15 DIAGNOSIS — I10 Essential (primary) hypertension: Secondary | ICD-10-CM | POA: Diagnosis present

## 2016-06-15 DIAGNOSIS — D696 Thrombocytopenia, unspecified: Secondary | ICD-10-CM | POA: Diagnosis not present

## 2016-06-15 DIAGNOSIS — Z7982 Long term (current) use of aspirin: Secondary | ICD-10-CM | POA: Diagnosis not present

## 2016-06-15 DIAGNOSIS — E43 Unspecified severe protein-calorie malnutrition: Secondary | ICD-10-CM | POA: Diagnosis present

## 2016-06-15 DIAGNOSIS — N179 Acute kidney failure, unspecified: Secondary | ICD-10-CM | POA: Diagnosis present

## 2016-06-15 DIAGNOSIS — A4181 Sepsis due to Enterococcus: Secondary | ICD-10-CM | POA: Diagnosis present

## 2016-06-15 DIAGNOSIS — R6521 Severe sepsis with septic shock: Secondary | ICD-10-CM | POA: Diagnosis present

## 2016-06-15 DIAGNOSIS — R1084 Generalized abdominal pain: Secondary | ICD-10-CM | POA: Diagnosis not present

## 2016-06-15 DIAGNOSIS — A4189 Other specified sepsis: Secondary | ICD-10-CM | POA: Diagnosis present

## 2016-06-15 DIAGNOSIS — I808 Phlebitis and thrombophlebitis of other sites: Secondary | ICD-10-CM | POA: Diagnosis not present

## 2016-06-15 DIAGNOSIS — R0603 Acute respiratory distress: Secondary | ICD-10-CM | POA: Diagnosis not present

## 2016-06-15 DIAGNOSIS — E872 Acidosis: Secondary | ICD-10-CM | POA: Diagnosis not present

## 2016-06-15 DIAGNOSIS — J209 Acute bronchitis, unspecified: Secondary | ICD-10-CM | POA: Diagnosis present

## 2016-06-15 LAB — BASIC METABOLIC PANEL
ANION GAP: 9 (ref 5–15)
BUN: 91 mg/dL — ABNORMAL HIGH (ref 6–20)
CALCIUM: 8.4 mg/dL — AB (ref 8.9–10.3)
CHLORIDE: 101 mmol/L (ref 101–111)
CO2: 26 mmol/L (ref 22–32)
CREATININE: 1.23 mg/dL (ref 0.61–1.24)
GFR calc non Af Amer: 50 mL/min — ABNORMAL LOW (ref >60)
GFR, EST AFRICAN AMERICAN: 58 mL/min — AB (ref >60)
Glucose, Bld: 179 mg/dL — ABNORMAL HIGH (ref 65–99)
Potassium: 4.7 mmol/L (ref 3.5–5.1)
SODIUM: 136 mmol/L (ref 135–145)

## 2016-06-15 LAB — URINALYSIS COMPLETE WITH MICROSCOPIC (ARMC ONLY)
Bilirubin Urine: NEGATIVE
Glucose, UA: NEGATIVE mg/dL
Hgb urine dipstick: NEGATIVE
Ketones, ur: NEGATIVE mg/dL
Leukocytes, UA: NEGATIVE
Nitrite: NEGATIVE
Protein, ur: NEGATIVE mg/dL
Specific Gravity, Urine: 1.013 (ref 1.005–1.030)
Squamous Epithelial / HPF: NONE SEEN
pH: 6 (ref 5.0–8.0)

## 2016-06-15 LAB — CBC WITH DIFFERENTIAL/PLATELET
BASOS ABS: 0 10*3/uL (ref 0–0.1)
BASOS PCT: 0 %
BASOS PCT: 0 %
Basophils Absolute: 0 10*3/uL (ref 0–0.1)
EOS ABS: 0 10*3/uL (ref 0–0.7)
EOS ABS: 0 10*3/uL (ref 0–0.7)
Eosinophils Relative: 0 %
Eosinophils Relative: 0 %
HEMATOCRIT: 22.1 % — AB (ref 40.0–52.0)
HEMATOCRIT: 19.2 % — AB (ref 40.0–52.0)
HEMOGLOBIN: 7.3 g/dL — AB (ref 13.0–18.0)
HEMOGLOBIN: 6.3 g/dL — AB (ref 13.0–18.0)
Lymphocytes Relative: 11 %
Lymphocytes Relative: 10 %
Lymphs Abs: 2.7 10*3/uL (ref 1.0–3.6)
Lymphs Abs: 2.4 10*3/uL (ref 1.0–3.6)
MCH: 30.6 pg (ref 26.0–34.0)
MCH: 30.6 pg (ref 26.0–34.0)
MCHC: 33.1 g/dL (ref 32.0–36.0)
MCHC: 32.9 g/dL (ref 32.0–36.0)
MCV: 92.5 fL (ref 80.0–100.0)
MCV: 93.1 fL (ref 80.0–100.0)
MONOS PCT: 8 %
Monocytes Absolute: 1.8 10*3/uL — ABNORMAL HIGH (ref 0.2–1.0)
Monocytes Absolute: 1.2 10*3/uL — ABNORMAL HIGH (ref 0.2–1.0)
Monocytes Relative: 5 %
NEUTROS ABS: 19.2 10*3/uL — AB (ref 1.4–6.5)
NEUTROS ABS: 19.4 10*3/uL — AB (ref 1.4–6.5)
NEUTROS PCT: 81 %
NEUTROS PCT: 85 %
Platelets: 231 10*3/uL (ref 150–440)
Platelets: 220 10*3/uL (ref 150–440)
RBC: 2.39 MIL/uL — ABNORMAL LOW (ref 4.40–5.90)
RBC: 2.06 MIL/uL — AB (ref 4.40–5.90)
RDW: 14.4 % (ref 11.5–14.5)
RDW: 14.1 % (ref 11.5–14.5)
WBC: 23.8 10*3/uL — ABNORMAL HIGH (ref 3.8–10.6)
WBC: 23 10*3/uL — AB (ref 3.8–10.6)

## 2016-06-15 LAB — PREPARE RBC (CROSSMATCH)

## 2016-06-15 LAB — ABO/RH: ABO/RH(D): A POS

## 2016-06-15 LAB — LACTIC ACID, PLASMA: LACTIC ACID, VENOUS: 5.8 mmol/L — AB (ref 0.5–1.9)

## 2016-06-15 MED ORDER — VANCOMYCIN HCL IN DEXTROSE 1-5 GM/200ML-% IV SOLN
1000.0000 mg | INTRAVENOUS | Status: AC
  Administered 2016-06-15: 1000 mg via INTRAVENOUS
  Filled 2016-06-15: qty 200

## 2016-06-15 MED ORDER — TRAMADOL HCL 50 MG PO TABS
50.0000 mg | ORAL_TABLET | Freq: Two times a day (BID) | ORAL | Status: DC | PRN
  Administered 2016-06-19 – 2016-06-21 (×3): 50 mg via ORAL
  Filled 2016-06-15 (×3): qty 1

## 2016-06-15 MED ORDER — PIPERACILLIN-TAZOBACTAM 3.375 G IVPB
3.3750 g | Freq: Three times a day (TID) | INTRAVENOUS | Status: DC
  Administered 2016-06-15 – 2016-06-22 (×20): 3.375 g via INTRAVENOUS
  Filled 2016-06-15 (×19): qty 50

## 2016-06-15 MED ORDER — IOPAMIDOL (ISOVUE-300) INJECTION 61%: 15.0000 mL | INTRAVENOUS | Status: AC

## 2016-06-15 MED ORDER — SODIUM CHLORIDE 0.9 % IV SOLN
Freq: Once | INTRAVENOUS | Status: AC
  Administered 2016-06-15: 18:00:00 via INTRAVENOUS

## 2016-06-15 MED ORDER — VANCOMYCIN HCL IN DEXTROSE 1-5 GM/200ML-% IV SOLN
1000.0000 mg | INTRAVENOUS | Status: DC
  Administered 2016-06-15 – 2016-06-18 (×3): 1000 mg via INTRAVENOUS
  Filled 2016-06-15 (×4): qty 200

## 2016-06-15 MED ORDER — SODIUM CHLORIDE 0.9 % IV SOLN
INTRAVENOUS | Status: DC
  Administered 2016-06-15 – 2016-06-19 (×6): via INTRAVENOUS

## 2016-06-15 MED ORDER — SODIUM CHLORIDE 0.9 % IV BOLUS (SEPSIS)
500.0000 mL | Freq: Once | INTRAVENOUS | Status: AC
  Administered 2016-06-15: 500 mL via INTRAVENOUS

## 2016-06-15 MED ORDER — METHYLPREDNISOLONE SODIUM SUCC 40 MG IJ SOLR
40.0000 mg | Freq: Two times a day (BID) | INTRAMUSCULAR | Status: DC
  Administered 2016-06-15 – 2016-06-16 (×2): 40 mg via INTRAVENOUS
  Filled 2016-06-15 (×2): qty 1

## 2016-06-15 NOTE — Clinical Social Work Note (Signed)
CSW has noted PT recommendation for STR however patient is here in the hospital under medicare observation and he would have to pay out of pocket for STR. The goal would be to get him back to [place] with home health. CSW spoke with Deatra Ina at [facility] and she stated that his baseline is ambulating with a walker. CSW has asked nursing staff to ambulate patient.  Shela Leff MSW,LCSW [phone]

## 2016-06-15 NOTE — Progress Notes (Signed)
CT results called to Dr Ara Kussmaul.  CLear liquid diet ordered.  Wound culture discontinued.  MD will start bowel prep tomorrow if pt has not had a BM by then

## 2016-06-15 NOTE — Progress Notes (Signed)
Lab called with critical lactic acid and hgb.  Dr Ara Kussmaul notified

## 2016-06-15 NOTE — Consult Note (Signed)
  Patient seen and examined  Patient is unable to give a history  Labs concerning for lactic acidosis and leukocytosis  Abdomen is soft and mildly distended  CT Pending  Full Consult note to follow after [place], MD [facility]

## 2016-06-15 NOTE — Progress Notes (Signed)
Pharmacy Antibiotic Note  Victor Parks is a 82 y.o. male admitted on 06/13/2016 with sepsis of unkown source.   Pharmacy has been consulted for vancomycin and Zosyn dosing. Patient is alson on azithromycin.   Plan: Vancomycin 1000 IV every 24 hours with stacked dosing and a trough with the 5th total dose.  Goal trough 15-20 mcg/mL.  Zosyn 3.375 g EI q 8 hours.   Height: 5\' 9"  (175.3 cm) Weight: 154 lb (69.9 kg) IBW/kg (Calculated) : 70.7  Temp (24hrs), Avg:98 F (36.7 C), Min:97.3 F (36.3 C), Max:98.7 F (37.1 C)   Recent Labs Lab 06/13/16 0254 06/14/16 0434 06/15/16 0516 06/15/16 1104  WBC 8.9 23.0* 23.8* 23.0*  CREATININE 1.30* 0.99 1.23  --   LATICACIDVEN  --   --   --  5.8*    Estimated Creatinine Clearance: 40.3 mL/min (by C-G formula based on SCr of 1.23 mg/dL).    Allergies  Allergen Reactions  . Ciprofloxacin Anaphylaxis    Antimicrobials this admission: Azithromycin  11/15 >>  vancomycin 11/17 >>  Vancomycin 11/17 >>  Dose adjustments this admission:   Microbiology results:  BCx: pending  UCx: pending   Sputum: pending  WCx: pending  Thank you for allowing pharmacy to be a part of this patient's care.  Ulice Dash D 06/15/2016 3:40 PM

## 2016-06-15 NOTE — Consult Note (Addendum)
Patient ID: Victor Parks, male   DOB: 03-05-1924, 82 y.o.   MRN: [id number]  CC: cough  HPI Victor Parks is a 82 y.o. male currently admitted to the medicine service for cough and bronchitis. Surgery consult was requested by Dr.Hugelmeyer today secondary to acute onset abdominal pain and distention. Patient is pleasantly confused and is unable to provide any history. History is completely obtained from nursing staff and the chart. No reports of vomiting but there are reports of nausea. Per nursing staff the patient had complained abdominal pain but the patient himself will not complaint of abdominal pain to me today.  HPI  Past Medical History:  Diagnosis Date  . Bladder cancer ([place])   . Decubitus ulcer   . Hypertension   . Muscle weakness   . UTI (urinary tract infection)     Past Surgical History:  Procedure Laterality Date  . none      Family history: No reported family history of heart disease or cancers.  Social History Social History  Substance Use Topics  . Smoking status: Current Some Day Smoker  . Smokeless tobacco: Never Used  . Alcohol use Yes    Allergies  Allergen Reactions  . Ciprofloxacin Anaphylaxis    Current Facility-Administered Medications  Medication Dose Route Frequency Provider Last Rate Last Dose  . 0.9 %  sodium chloride infusion  250 mL Intravenous PRN Pavan Pyreddy, MD      . 0.9 %  sodium chloride infusion   Intravenous Once Alexis Hugelmeyer, DO      . 0.9 %  sodium chloride infusion   Intravenous Continuous Alexis Hugelmeyer, DO      . acetaminophen (TYLENOL) tablet 650 mg  650 mg Oral Q6H PRN Saundra Shelling, MD       Or  . acetaminophen (TYLENOL) suppository 650 mg  650 mg Rectal Q6H PRN Saundra Shelling, MD      . aspirin EC tablet 81 mg  81 mg Oral Once Saundra Shelling, MD      . azithromycin (ZITHROMAX) tablet 250 mg  250 mg Oral Daily Saundra Shelling, MD   250 mg at 06/15/16 0935  . calcium-vitamin D (OSCAL WITH D) 500-200 MG-UNIT  per tablet 2 tablet  2 tablet Oral Daily Saundra Shelling, MD   2 tablet at 06/15/16 0934  . cholestyramine light (PREVALITE) packet 4 g  4 g Oral Daily Saundra Shelling, MD   4 g at 06/15/16 0936  . feeding supplement (BOOST / RESOURCE BREEZE) liquid 1 Container  1 Container Oral TID BM Alexis Hugelmeyer, DO   1 Container at 06/15/16 0936  . guaiFENesin-dextromethorphan (ROBITUSSIN DM) 100-10 MG/5ML syrup 5 mL  5 mL Oral Q4H PRN Pavan Pyreddy, MD      . ipratropium-albuterol (DUONEB) 0.5-2.5 (3) MG/3ML nebulizer solution 3 mL  3 mL Nebulization TID Saundra Shelling, MD   3 mL at 06/15/16 1416  . methylPREDNISolone sodium succinate (SOLU-MEDROL) 40 mg/mL injection 40 mg  40 mg Intravenous Q12H Alexis Hugelmeyer, DO      . multivitamin with minerals tablet 1 tablet  1 tablet Oral Daily Saundra Shelling, MD   1 tablet at 06/15/16 0934  . ondansetron (ZOFRAN) tablet 4 mg  4 mg Oral Q6H PRN Saundra Shelling, MD       Or  . ondansetron (ZOFRAN) injection 4 mg  4 mg Intravenous Q6H PRN Saundra Shelling, MD   4 mg at 06/14/16 1221  . piperacillin-tazobactam (ZOSYN) IVPB 3.375 g  3.375 g Intravenous Q8H Scott  Jyl Heinz, RPH      . potassium citrate (UROCIT-K) SR tablet 10 mEq  10 mEq Oral BID Saundra Shelling, MD   10 mEq at 06/15/16 0935  . senna-docusate (Senokot-S) tablet 1 tablet  1 tablet Oral QHS PRN Saundra Shelling, MD      . simethicone (MYLICON) chewable tablet 80 mg  80 mg Oral Q6H PRN Pavan Pyreddy, MD      . sodium chloride flush (NS) 0.9 % injection 3 mL  3 mL Intravenous Q12H Saundra Shelling, MD   3 mL at 06/15/16 0939  . sodium chloride flush (NS) 0.9 % injection 3 mL  3 mL Intravenous PRN Pavan Pyreddy, MD      . traMADol (ULTRAM) tablet 50 mg  50 mg Oral Q12H PRN Napoleon Form, RPH      . vancomycin (VANCOCIN) IVPB 1000 mg/200 mL premix  1,000 mg Intravenous STAT Napoleon Form, RPH      . [START ON 06/16/2016] vancomycin (VANCOCIN) IVPB 1000 mg/200 mL premix  1,000 mg Intravenous Q24H Napoleon Form, RPH       . vitamin B-12 (CYANOCOBALAMIN) tablet 1,000 mcg  1,000 mcg Oral Daily Saundra Shelling, MD   1,000 mcg at 06/15/16 0934  . vitamin C (ASCORBIC ACID) tablet 1,000 mg  1,000 mg Oral Daily Saundra Shelling, MD   1,000 mg at 06/15/16 [id number]     Review of Systems A full review of systems is unable to be obtained secondary to the patient's mental status.  Physical Exam Blood pressure 119/61, pulse 89, temperature 97.3 F (36.3 C), temperature source Axillary, resp. rate 20, height 5\' 9"  (1.753 m), weight 69.9 kg (154 lb), SpO2 96 %. CONSTITUTIONAL: Resting in bed in no obvious distress. EYES: Pupils are equal, round, and reactive to light, Sclera are non-icteric. EARS, NOSE, MOUTH AND THROAT: The oropharynx is clear. The oral mucosa is pink and moist. Hearing is intact to voice. LYMPH NODES:  Lymph nodes in the neck are normal. RESPIRATORY:  Lungs are clear.  CARDIOVASCULAR: Heart is regular without murmurs, gallops, or rubs. GI: The abdomen is soft, nontender, and nondistended. There is a left lower quadrant urostomy. There is no hepatosplenomegaly. There are normal bowel sounds in all quadrants. GU: Rectal deferred.   PSYCH:  Oriented to person, place and time. The patient also appears to be confused  Data Reviewed Images and labs reviewed. Labs are concerning for leukocytosis of 23,000, anemia of 6.3 and a lactic acidosis of 5.8. CT scan personally reviewed by me. There is no evidence of bowel obstruction, free air, intra-abdominal abscess. The stomach does appear to be large but the small bowel draining the stomach or of normal caliber. I have personally reviewed the patient's imaging, laboratory findings and medical records.    Assessment    Leukocytosis of unknown origin    Plan    82 year old male admitted to the medicine service with a leukocytosis and lactic acidosis of unclear origin. Patient was pleasantly confused during my interview. He denied any abdominal pain. His CT of his  abdomen and pelvis does not show any clear source for his leukocytosis or his lactic acidosis. He has had an acute development of anemia over the last 24 hours also from an unclear source. However, given the lactic acidosis gastrointestinal bleeding is of concern. Would monitor for melena or hemoptysis. At this point there is no clear diagnosis that repaired prior surgical intervention. Surgery will continue to follow along for now.  Time spent with the patient was 55 minutes, with more than 50% of the time spent in face-to-face education, counseling and care coordination.     Clayburn Pert, MD FACS General Surgeon 06/15/2016, 4:54 PM

## 2016-06-15 NOTE — Progress Notes (Signed)
Contrast complete.  CT notified

## 2016-06-15 NOTE — Progress Notes (Signed)
Patient ID: Zahair Cali, male   DOB: 1923-09-05, 82 y.o.   MRN: [id number]   [place] [place] NAME: Rodd Polan    MR#:  [id number]  DATE OF BIRTH:  1923-08-22  SUBJECTIVE:  CHIEF COMPLAINT:   Chief Complaint  Patient presents with  . Cough   Patient seen and examined at bedside in the presence of his wife. States that his breathing is significantly improved however he complains of recurrent abdominal pain. Patient's pain had resolved for the entire day yesterday he only complained of some mild nausea after lunch however this morning he reports mild to moderate abdominal pain which is relieved by lying on his right side. He reports that he is passing gas and has tolerated meals plus far without vomiting.  I discussed the patient's history at length with his daughter by phone who reports that he did have small bowel obstruction when he was hospitalized at [facility] last year and was treated conservatively. She also reports that he complains of abdominal pain intermittently for several months. REVIEW OF SYSTEMS:  ROS CONSTITUTIONAL: No fever, fatigue or weakness.  EYES: No blurred or double vision.  EARS, NOSE, AND THROAT: No tinnitus or ear pain.  RESPIRATORY: Negative cough denies shortness of breath, wheezing or hemoptysis.  CARDIOVASCULAR: No chest pain, orthopnea, edema.  GASTROINTESTINAL: No nausea, vomiting, diarrhea.Reporting diffuseMild abdominal pain.  GENITOURINARY: No dysuria, hematuria.  ENDOCRINE: No polyuria, nocturia,  HEMATOLOGY: No anemia, easy bruising or bleeding SKIN: No rash or lesion. MUSCULOSKELETAL: No joint pain or arthritis.  NEUROLOGIC: No tingling, numbness, weakness.  PSYCHIATRY: No anxiety or depression.   DRUG ALLERGIES:   Allergies  Allergen Reactions  . Ciprofloxacin Anaphylaxis   VITALS:  Blood pressure 119/61, pulse 89, temperature 97.3 F (36.3 C), temperature source Axillary, resp. rate  20, height 5\' 9"  (1.753 m), weight 69.9 kg (154 lb), SpO2 96 %. PHYSICAL EXAMINATION:  Physical Exam  GENERAL: 82 y.o.-year-old patient lying in the bed with mild acute distress.  EYES: Pupils equal, round, reactive to light and accommodation. No scleral icterus. Extraocular muscles intact.  HEENT: Head atraumatic, normocephalic. Oropharynx and nasopharynx clear.  NECK: Supple, no jugular venous distention. No thyroid enlargement, no tenderness.  LUNGS: Normal breath sounds bilaterally, no wheezing, rales,rhonchi or crepitation. No use of accessory muscles of respiration.  CARDIOVASCULAR: S1, S2 normal. No murmurs, rubs, or gallops.  ABDOMEN: Soft, nontender, nondistended. Bowel sounds present. No organomegaly or mass.  EXTREMITIES: No pedal edema, cyanosis, or clubbing.  NEUROLOGIC: Cranial nerves II through XII are intact. Muscle strength 5/5 in all extremities. Sensation intact. Gait not checked.  PSYCHIATRIC: The patient is alert and oriented x 3.  SKIN: No obvious rash, lesion, or ulcer.   LABORATORY PANEL:   CBC  Recent Labs Lab 06/15/16 1104  WBC 23.0*  HGB 6.3*  HCT 19.2*  PLT 220   ------------------------------------------------------------------------------------------------------------------ Chemistries   Recent Labs Lab 06/15/16 0516  NA 136  K 4.7  CL 101  CO2 26  GLUCOSE 179*  BUN 91*  CREATININE 1.23  CALCIUM 8.4*   RADIOLOGY:  No results found. ASSESSMENT AND PLAN:    82 year old elderly male patient with history of bladder cancer, hypertension, decubitus ulcer, UTI Admitted on 06/13/2016 for bronchitis and AKI.  1. Abdominal pain. X-ray images on admission revealed partial small bowel obstruction versus ileus however patient was asymptomatic, passing gas and tolerating food. Stat CBC and lactic acid revealed  leukocytosis and elevated lactate with  hypotenion, history of small bowel obstruction and recent x-ray findings I now have concern for  bowel obstruction and/or bowel ischemia. Will obtain stat CT of the abdomen and pelvis with contrast, surgical consult requested  2. Sepsis unclear etiology - given leukocytosis, lactic acid and Hypotension-responding to IV fluid hydration. We'll monitor and hold antihypertensives for now.  Start empiric antibiotics Vanco Zosyn, panculture including wounds, urine, blood and sputum.   3. Anemia, significant decrease in hemoglobin and hematocrit from prior labs. We'll transfuse 2 units of packed red blood cells, check FOBT, iron studies and discontinue Lovenox until acute blood loss anemia is ruled out.  4.Acute bronchitis causing dyspnea and wheezing, improving.  Continue Solu-Medrol at a lower dose, nebulizer treatments as needed Continue azithromycin.  5. Acute kidney injury, improving Monitor renal function. IV fluids, avoid nephrotoxins  6..Decubitus ulcer-reposition patient every hours. Culture wounds as part of leukocytosis w/u.   7.Hypertension-stable. Continue close monitoring  All the records are reviewed and case discussed with Care Management/Social Worker. Management plans discussed with the patient, family and they are in agreement.  CODE STATUS: DO NOT RESUSCITATE  TOTAL TIME TAKING CARE OF THIS PATIENT: 60 minutes.   More than 50% of the time was spent in counseling/coordination of care: YES  POSSIBLE D/C IN 3-4 DAYS, DEPENDING ON CLINICAL CONDITION.    Naman Spychalski D.O. on 06/15/2016 at 3:12 PM  Between 7am to 6pm - Pager - [phone]  After 6pm go to [email or web address] - Proofreader  [facility]  Office  [phone]  CC: Primary care physician; [name], MD  Note: This dictation was prepared with Dragon dictation along with smaller phrase technology. Any transcriptional errors that result from this process are unintentional.

## 2016-06-16 ENCOUNTER — Inpatient Hospital Stay: Payer: Medicare Other

## 2016-06-16 DIAGNOSIS — R1084 Generalized abdominal pain: Secondary | ICD-10-CM

## 2016-06-16 DIAGNOSIS — R6521 Severe sepsis with septic shock: Secondary | ICD-10-CM

## 2016-06-16 DIAGNOSIS — A419 Sepsis, unspecified organism: Secondary | ICD-10-CM

## 2016-06-16 LAB — CBC WITH DIFFERENTIAL/PLATELET
BASOS ABS: 0 10*3/uL (ref 0–0.1)
Band Neutrophils: 6 %
Basophils Relative: 0 %
Blasts: 0 %
EOS PCT: 0 %
Eosinophils Absolute: 0 10*3/uL (ref 0–0.7)
HEMATOCRIT: 23.8 % — AB (ref 40.0–52.0)
HEMOGLOBIN: 8 g/dL — AB (ref 13.0–18.0)
LYMPHS ABS: 3.9 10*3/uL — AB (ref 1.0–3.6)
Lymphocytes Relative: 15 %
MCH: 29.6 pg (ref 26.0–34.0)
MCHC: 33.6 g/dL (ref 32.0–36.0)
MCV: 88 fL (ref 80.0–100.0)
METAMYELOCYTES PCT: 1 %
MONO ABS: 2.6 10*3/uL — AB (ref 0.2–1.0)
MYELOCYTES: 1 %
Monocytes Relative: 10 %
NEUTROS PCT: 67 %
Neutro Abs: 19.2 10*3/uL — ABNORMAL HIGH (ref 1.4–6.5)
Other: 0 %
PROMYELOCYTES ABS: 0 %
Platelets: 131 10*3/uL — ABNORMAL LOW (ref 150–440)
RBC: 2.7 MIL/uL — AB (ref 4.40–5.90)
RDW: 15.3 % — ABNORMAL HIGH (ref 11.5–14.5)
WBC: 25.7 10*3/uL — AB (ref 3.8–10.6)
nRBC: 1 /100 WBC — ABNORMAL HIGH

## 2016-06-16 LAB — BASIC METABOLIC PANEL
Anion gap: 9 (ref 5–15)
BUN: 99 mg/dL — AB (ref 6–20)
CHLORIDE: 108 mmol/L (ref 101–111)
CO2: 19 mmol/L — AB (ref 22–32)
Calcium: 8 mg/dL — ABNORMAL LOW (ref 8.9–10.3)
Creatinine, Ser: 1.41 mg/dL — ABNORMAL HIGH (ref 0.61–1.24)
GFR calc Af Amer: 49 mL/min — ABNORMAL LOW (ref >60)
GFR, EST NON AFRICAN AMERICAN: 43 mL/min — AB (ref >60)
GLUCOSE: 177 mg/dL — AB (ref 65–99)
POTASSIUM: 4.1 mmol/L (ref 3.5–5.1)
Sodium: 136 mmol/L (ref 135–145)

## 2016-06-16 LAB — IRON AND TIBC
Iron: 215 ug/dL — ABNORMAL HIGH (ref 45–182)
SATURATION RATIOS: 85 % — AB (ref 17.9–39.5)
TIBC: 254 ug/dL (ref 250–450)
UIBC: 39 ug/dL

## 2016-06-16 LAB — COMPREHENSIVE METABOLIC PANEL
ALBUMIN: 2.4 g/dL — AB (ref 3.5–5.0)
ALK PHOS: 29 U/L — AB (ref 38–126)
ALT: 12 U/L — AB (ref 17–63)
AST: 22 U/L (ref 15–41)
Anion gap: 6 (ref 5–15)
BILIRUBIN TOTAL: 0.7 mg/dL (ref 0.3–1.2)
BUN: 84 mg/dL — ABNORMAL HIGH (ref 6–20)
CALCIUM: 7.5 mg/dL — AB (ref 8.9–10.3)
CO2: 23 mmol/L (ref 22–32)
CREATININE: 1.15 mg/dL (ref 0.61–1.24)
Chloride: 105 mmol/L (ref 101–111)
GFR calc Af Amer: >60 mL/min (ref >60)
GFR calc non Af Amer: 54 mL/min — ABNORMAL LOW (ref >60)
GLUCOSE: 171 mg/dL — AB (ref 65–99)
Potassium: 4.6 mmol/L (ref 3.5–5.1)
SODIUM: 134 mmol/L — AB (ref 135–145)
Total Protein: 4.5 g/dL — ABNORMAL LOW (ref 6.5–8.1)

## 2016-06-16 LAB — TROPONIN I

## 2016-06-16 LAB — BLOOD GAS, ARTERIAL
Acid-base deficit: 2.9 mmol/L — ABNORMAL HIGH (ref 0.0–2.0)
BICARBONATE: 18 mmol/L — AB (ref 20.0–28.0)
FIO2: 0.21
O2 Saturation: 96.7 %
PH ART: 7.52 — AB (ref 7.350–7.450)
Patient temperature: 37
pCO2 arterial: 22 mmHg — ABNORMAL LOW (ref 32.0–48.0)
pO2, Arterial: 78 mmHg — ABNORMAL LOW (ref 83.0–108.0)

## 2016-06-16 LAB — CBC
HEMATOCRIT: 18.9 % — AB (ref 40.0–52.0)
Hemoglobin: 6.2 g/dL — ABNORMAL LOW (ref 13.0–18.0)
MCH: 29.3 pg (ref 26.0–34.0)
MCHC: 32.6 g/dL (ref 32.0–36.0)
MCV: 89.8 fL (ref 80.0–100.0)
Platelets: 146 10*3/uL — ABNORMAL LOW (ref 150–440)
RBC: 2.1 MIL/uL — ABNORMAL LOW (ref 4.40–5.90)
RDW: 15.7 % — AB (ref 11.5–14.5)
WBC: 45.1 10*3/uL — AB (ref 3.8–10.6)

## 2016-06-16 LAB — LACTIC ACID, PLASMA
Lactic Acid, Venous: 5.5 mmol/L (ref 0.5–1.9)
Lactic Acid, Venous: 6.1 mmol/L (ref 0.5–1.9)
Lactic Acid, Venous: 5.1 mmol/L (ref 0.5–1.9)

## 2016-06-16 LAB — AMMONIA: AMMONIA: 20 umol/L (ref 9–35)

## 2016-06-16 LAB — PROTIME-INR
INR: 1.3
PROTHROMBIN TIME: 16.3 s — AB (ref 11.4–15.2)

## 2016-06-16 LAB — FOLATE: Folate: 16.9 ng/mL (ref >5.9)

## 2016-06-16 LAB — PHOSPHORUS: Phosphorus: 2.7 mg/dL (ref 2.5–4.6)

## 2016-06-16 LAB — MAGNESIUM: Magnesium: 2.2 mg/dL (ref 1.7–2.4)

## 2016-06-16 LAB — FERRITIN: FERRITIN: 37 ng/mL (ref 24–336)

## 2016-06-16 LAB — VITAMIN B12: Vitamin B-12: 976 pg/mL — ABNORMAL HIGH (ref 180–914)

## 2016-06-16 MED ORDER — METHYLPREDNISOLONE SODIUM SUCC 40 MG IJ SOLR: 40.0000 mg | INTRAMUSCULAR | Status: DC

## 2016-06-16 MED ORDER — SODIUM CHLORIDE 0.9 % IV BOLUS (SEPSIS)
1000.0000 mL | Freq: Once | INTRAVENOUS | Status: AC
  Administered 2016-06-16: 1000 mL via INTRAVENOUS

## 2016-06-16 MED ORDER — FLEET ENEMA 7-19 GM/118ML RE ENEM
1.0000 | ENEMA | Freq: Once | RECTAL | Status: AC
  Administered 2016-06-16: 1 via RECTAL

## 2016-06-16 MED ORDER — SODIUM CHLORIDE 0.9 % IV SOLN: Freq: Once | INTRAVENOUS | Status: DC

## 2016-06-16 MED ORDER — IPRATROPIUM-ALBUTEROL 0.5-2.5 (3) MG/3ML IN SOLN: 3.0000 mL | Freq: Four times a day (QID) | RESPIRATORY_TRACT | Status: DC | PRN

## 2016-06-16 MED ORDER — BISACODYL 10 MG RE SUPP
10.0000 mg | Freq: Once | RECTAL | Status: AC
  Administered 2016-06-16: 10 mg via RECTAL
  Filled 2016-06-16: qty 1

## 2016-06-16 NOTE — Consult Note (Signed)
Referring Provider: Dr. Ara Kussmaul Primary Care Physician:  [name], MD Primary Gastroenterologist:  Althia Forts  Reason for Consultation:  Anemia; Fecal Impaction  HPI: Victor Parks is a 82 y.o. male with difficulty moving his bowels per his wife who does not know how long he has had trouble and unable to get any history from him. Patient has been having recurrent abdominal pain but unable to tell me anything about the pain. He is writhing in the bed complaining of pain. Not passing gas or stools. CT scan showed a fecal impaction without a small bowel obstruction or ileus. He has a urostomy from bladder cancer. Worked up for sepsis due to leukocytosis, lactic acidosis, and hypotension. Hgb 8.0 S/P 2 U PRBCs yesterday (6.3 on 06/15/16, 7.3, 10.3 on 06/14/16). Reportedly had an SBO at [facility] last year per chart review of Dr. Geraldine Solar note. History mainly obtained from chart and nurse.  No history of rectal bleeding, hematochezia, or melena. No N/V. S/P tap water enema this morning and no BMs following it yet. Wife does not know whether he has ever had a colonoscopy.   Past Medical History:  Diagnosis Date  . Bladder cancer ([place])   . Decubitus ulcer   . Hypertension   . Muscle weakness   . UTI (urinary tract infection)     Past Surgical History:  Procedure Laterality Date  . none      Prior to Admission medications   Medication Sig Start Date End Date Taking? Authorizing Provider  acetaminophen (TYLENOL) 325 MG tablet Take 650 mg by mouth every 6 (six) hours as needed.   Yes Historical Provider, MD  Ascorbic Acid (VITAMIN C) 1000 MG tablet Take 1,000 mg by mouth daily.   Yes Historical Provider, MD  aspirin EC 81 MG tablet Take 81 mg by mouth once. tuesday   Yes Historical Provider, MD  Calcium Carb-Cholecalciferol (CALCIUM 1000 + D) 1000-800 MG-UNIT TABS Take 1 tablet by mouth daily.   Yes Historical Provider, MD  cholestyramine light (PREVALITE) 4 g packet Take 4 g by mouth  daily.   Yes Historical Provider, MD  Fluticasone-Salmeterol (ADVAIR) 250-50 MCG/DOSE AEPB Inhale 1 puff into the lungs 2 (two) times daily.   Yes Historical Provider, MD  lactose free nutrition (BOOST) LIQD Take 1 Container by mouth daily.   Yes Historical Provider, MD  loperamide (IMODIUM A-D) 2 MG tablet Take 2 mg by mouth every 6 (six) hours as needed for diarrhea or loose stools.   Yes Historical Provider, MD  Menthol-Zinc Oxide (CALMOSEPTINE) 0.44-20.6 % OINT Apply 1 application topically every 12 (twelve) hours as needed. To buttocks for rash   Yes Historical Provider, MD  Multiple Vitamin (DAILY VITE) TABS Take 1 tablet by mouth daily.   Yes Historical Provider, MD  potassium citrate (UROCIT-K) 10 MEQ (1080 MG) SR tablet Take 10 mEq by mouth 2 (two) times daily.   Yes Historical Provider, MD  simethicone (MYLICON) 80 MG chewable tablet Chew 80 mg by mouth every 6 (six) hours as needed for flatulence.   Yes Historical Provider, MD  traMADol (ULTRAM) 50 MG tablet Take 50 mg by mouth every 12 (twelve) hours as needed.   Yes Historical Provider, MD  vitamin B-12 (CYANOCOBALAMIN) 1000 MCG tablet Take 1,000 mcg by mouth daily.   Yes Historical Provider, MD    Scheduled Meds: . aspirin EC  81 mg Oral Once  . azithromycin  250 mg Oral Daily  . calcium-vitamin D  2 tablet Oral Daily  . cholestyramine  light  4 g Oral Daily  . feeding supplement  1 Container Oral TID BM  . ipratropium-albuterol  3 mL Nebulization TID  . [START ON 06/17/2016] methylPREDNISolone (SOLU-MEDROL) injection  40 mg Intravenous Q24H  . multivitamin with minerals  1 tablet Oral Daily  . piperacillin-tazobactam (ZOSYN)  IV  3.375 g Intravenous Q8H  . potassium citrate  10 mEq Oral BID  . sodium chloride flush  3 mL Intravenous Q12H  . vancomycin  1,000 mg Intravenous Q24H  . vitamin B-12  1,000 mcg Oral Daily  . vitamin C  1,000 mg Oral Daily   Continuous Infusions: . sodium chloride 75 mL/hr at 06/16/16 0742   PRN  Meds:.sodium chloride, acetaminophen **OR** acetaminophen, guaiFENesin-dextromethorphan, ondansetron **OR** ondansetron (ZOFRAN) IV, senna-docusate, simethicone, sodium chloride flush, traMADol  Allergies as of 06/13/2016 - Review Complete 06/13/2016  Allergen Reaction Noted  . Ciprofloxacin Anaphylaxis 05/29/2016    History reviewed. No pertinent family history.  Social History   Social History  . Marital status: Married    Spouse name: N/A  . Number of children: N/A  . Years of education: N/A   Occupational History  . retired    Social History Main Topics  . Smoking status: Current Some Day Smoker  . Smokeless tobacco: Never Used  . Alcohol use Yes  . Drug use: No  . Sexual activity: Not on file   Other Topics Concern  . Not on file   Social History Narrative  . No narrative on file    Review of Systems: Unable to obtain due to mental status  Physical Exam: Vital signs: Vitals:   06/16/16 0627 06/16/16 1254  BP: 94/64 (!) 97/46  Pulse: (!) 102 (!) 105  Resp: 20 18  Temp: 97.5 F (36.4 C) 97.4 F (36.3 C)   Last BM Date: 06/12/16 General:   Lethargic, elderly, thin, frail HEENT: anicteric sclera, oropharynx clear Lungs:  Coarse breath sounds  Heart:  Regular rate and rhythm; no murmurs, clicks, rubs,  or gallops. Abdomen: diffusely tender with guarding, +distention, +BS  Rectal:  Deferred Ext: no edema Skin: pale, dry  GI:  Lab Results:  Recent Labs  06/15/16 0516 06/15/16 1104 06/16/16 0210  WBC 23.8* 23.0* 25.7*  HGB 7.3* 6.3* 8.0*  HCT 22.1* 19.2* 23.8*  PLT 231 220 131*   BMET  Recent Labs  06/14/16 0434 06/15/16 0516 06/16/16 0210  NA 137 136 134*  K 4.6 4.7 4.6  CL 101 101 105  CO2 29 26 23   GLUCOSE 162* 179* 171*  BUN 57* 91* 84*  CREATININE 0.99 1.23 1.15  CALCIUM 8.3* 8.4* 7.5*   LFT  Recent Labs  06/16/16 0210  PROT 4.5*  ALBUMIN 2.4*  AST 22  ALT 12*  ALKPHOS 29*  BILITOT 0.7   PT/INR No results for  input(s): LABPROT, INR in the last 72 hours.   Studies/Results: Ct Abdomen Pelvis Wo Contrast  Result Date: 06/15/2016 CLINICAL DATA:  Abdominal pain.  Relieve by lying on the right side. EXAM: CT ABDOMEN AND PELVIS WITHOUT CONTRAST TECHNIQUE: Multidetector CT imaging of the abdomen and pelvis was performed following the standard protocol without IV contrast. COMPARISON:  None. FINDINGS: Lower chest: Nodular airspace disease of the left lung base. Hepatobiliary: No focal liver abnormality is seen. No gallstones, gallbladder wall thickening, or biliary dilatation. Pancreas: Unremarkable. No pancreatic ductal dilatation or surrounding inflammatory changes. Spleen: Normal in size without focal abnormality. Adrenals/Urinary Tract: Normal adrenal glands. Nonobstructing right renal calculus. Kidneys otherwise  normal. No obstructive uropathy. Prior cystectomy with a right lower quadrant urostomy. Stomach/Bowel: No bowel wall thickening or bowel dilatation. No pneumatosis, pneumoperitoneum or portal venous gas. Rectal fecal impaction. Small hiatal hernia. Vascular/Lymphatic: Normal caliber abdominal aorta with atherosclerosis. No lymphadenopathy. Reproductive: Prior prostatectomy with surgical clips along the pelvic sidewalls. Other: No fluid collection or hematoma.  No abdominal wall hernia. Musculoskeletal: Chronic L1 vertebral body compression fracture. Mild chronic L3 vertebral body compression fracture. Degenerative disc disease with disc height loss at L3-4, L4-5 and L5-S1 with bilateral facet arthropathy. ORIF baby right intertrochanteric fracture. IMPRESSION: 1. No acute abdominal or pelvic pathology. 2. Prior cystectomy with in right lower quadrant urostomy. 3. Nonobstructing right renal calculus. Electronically Signed   By: Kathreen Devoid   On: 06/15/2016 16:39    Impression/Plan: 82 yo man with obstipation and fecal impaction with anemia, lactic acidosis, and leukocytosis. He could be having colonic  bleeding from stercoral ulcers from his constipation. Doubt an upper tract source without any overt bleeding. Needs tap water enemas to try and clear the impaction and he likely will have bleeding when the impaction clears but it may be due to the trauma to the colon from the impaction. Would manage conservatively and not plan to do a colonoscopy or sigmoidoscopy unless bleeding occurs and persists following the resolution of the impaction. Will repeat tap water enema tonight. Follow H/Hs. Supportive care.    LOS: 1 day   [place] C.  06/16/2016, 2:22 PM

## 2016-06-16 NOTE — Progress Notes (Signed)
06/16/2016  Subjective: Called to patient's bedside by ICU team.  Patient had been hypotensive with mild tachycardia, with more confused mental status.  Patient being transferred to ICU for closer monitoring.  Labs obtained at4:56 pm show a lactic acid of 6.1, WBC of 45.1, with a decreased Hct of 18.9.  Received NS bolus and lactic acid is now 5.1, but has a bump in Cr to 1.41 with Co2 of 19.   When asked, the patient denies any pain but does appear uncomfortable.  Vital signs: Temp:  [97.2 F (36.2 C)-97.8 F (36.6 C)] 97.2 F (36.2 C) (11/18 1958) Pulse Rate:  [95-105] 96 (11/18 1958) Resp:  [16-20] 19 (11/18 1958) BP: (94-114)/(43-70) 109/43 (11/18 1958) SpO2:  [92 %-100 %] 96 % (11/18 1958) FiO2 (%):  [28 %] 28 % (11/18 0815)   Intake/Output: 11/17 0701 - 11/18 0700 In: 1209.3 [I.V.:251.3; Blood:708; IV Piggyback:250] Out: 1800 [Urine:1800] Last BM Date: 06/13/16  Physical Exam: Constitutional:  Appears uncomfortable but denies pain anywhere. Abdomen:  Soft, non-distended, non-tender to palpation, no peritoneal signs.  Urostomy is pink and viable. Rectal:  Decreased rectal tone, with impacted stool palpable.  Tenderness when trying to disimpact.  No gross blood.  Labs:   Recent Labs  06/16/16 0210 06/16/16 1656  WBC 25.7* 45.1*  HGB 8.0* 6.2*  HCT 23.8* 18.9*  PLT 131* 146*    Recent Labs  06/15/16 0516 06/16/16 0210  NA 136 134*  K 4.7 4.6  CL 101 105  CO2 26 23  GLUCOSE 179* 171*  BUN 91* 84*  CREATININE 1.23 1.15  CALCIUM 8.4* 7.5*    Recent Labs  06/16/16 2017  LABPROT 16.3*  INR 1.30    Imaging: Dg Abd 1 View  Result Date: 06/16/2016 CLINICAL DATA:  Pain, inability to give further history EXAM: ABDOMEN - 1 VIEW COMPARISON:  CT abdomen and pelvis performed 06/15/2016. FINDINGS: No bowel obstruction is evident. Enteric contrast fills the colon. Within limits for assessment on portable supine radiograph, no visible free air. Prior cystectomy for  bladder cancer with RIGHT lower quadrant urostomy. Osteopenia. RIGHT femoral neck ORIF prosthesis, stable. IMPRESSION: No acute findings. Electronically Signed   By: Staci Righter M.D.   On: 06/16/2016 20:55   Dg Chest Port 1 View  Result Date: 06/16/2016 CLINICAL DATA:  Pain.  Hypertension EXAM: PORTABLE CHEST 1 VIEW COMPARISON:  June 13, 2016 FINDINGS: There is no edema or consolidation. Heart size and pulmonary vascularity are normal. No adenopathy. There is atherosclerotic calcification aorta. No bone lesions. IMPRESSION: Aortic atherosclerosis.  No edema or consolidation. Electronically Signed   By: Lowella Grip III M.D.   On: 06/16/2016 20:57    Assessment/Plan: 82 yo male with leukocytosis and lactic acidosis of unclear etiology.  Patient's imaging studies have been negative thus far including CT scan yesterday afternoon and XR today.  However, his lactic acid and WBC are increased, with decreasing Co2.  -No acute surgical need at this point.  Patient not peritoneal and no gross blood on rectal exam.  KUB does not reveal free air and the contrast from prior CT scan is in descending colon.  Nothing thus far to explain his increased WBC and lactic acidosis.  Patient will be transferred to ICU for closer monitoring. -Recommend transfusing pRBC given his low Hct with mild hypotension and tachycardia.  Recommend continuing IV fluid hydration and bolusing given his lactic acidosis.  Obtain new set of labs at midnight and if there is no improvement, consider  repeating CT scan of abdomen and pelvis to check for intra-abdominal pathology that may be more occult. -Will continue following along with you.   Melvyn Neth, [place]

## 2016-06-16 NOTE — Progress Notes (Signed)
Dr. Ara Kussmaul was notified of patient abnormal labs and patient status. Waiting for an order to transfer patient to ICU.

## 2016-06-16 NOTE — Progress Notes (Signed)
Subjective:   He reports that he feels better this morning. He's not having any abdominal pain by report. He is not nauseated. He remains a bit confused. He had low-grade temperature over the course of the evening. His heart rate is slightly above normal. His white blood cell count remains elevated at 25,000. His hemoglobin is better at 8 g.  Vital signs in last 24 hours: Temp:  [97.3 F (36.3 C)-98.7 F (37.1 C)] 97.5 F (36.4 C) (11/18 0627) Pulse Rate:  [88-103] 102 (11/18 0627) Resp:  [16-24] 20 (11/18 0627) BP: (76-119)/(41-70) 94/64 (11/18 0627) SpO2:  [88 %-98 %] 95 % (11/18 0627) FiO2 (%):  [21 %-28 %] 28 % (11/17 1429) Last BM Date: 06/12/16  Intake/Output from previous day: 11/17 0701 - 11/18 0700 In: 1209.3 [I.V.:251.3; Blood:708; IV Piggyback:250] Out: 1800 [Urine:1800]  Exam:  His abdomen is soft with no significant abdominal tenderness. He does not have any hernias noted. He has active bowel sounds. His urostomy is right lower quadrant and appears healthy.  Lab Results:  CBC  Recent Labs  06/15/16 1104 06/16/16 0210  WBC 23.0* 25.7*  HGB 6.3* 8.0*  HCT 19.2* 23.8*  PLT 220 131*   CMP     Component Value Date/Time   NA 134 (L) 06/16/2016 0210   K 4.6 06/16/2016 0210   CL 105 06/16/2016 0210   CO2 23 06/16/2016 0210   GLUCOSE 171 (H) 06/16/2016 0210   BUN 84 (H) 06/16/2016 0210   CREATININE 1.15 06/16/2016 0210   CALCIUM 7.5 (L) 06/16/2016 0210   PROT 4.5 (L) 06/16/2016 0210   ALBUMIN 2.4 (L) 06/16/2016 0210   AST 22 06/16/2016 0210   ALT 12 (L) 06/16/2016 0210   ALKPHOS 29 (L) 06/16/2016 0210   BILITOT 0.7 06/16/2016 0210   GFRNONAA 54 (L) 06/16/2016 0210   GFRAA >60 06/16/2016 0210   PT/INR No results for input(s): LABPROT, INR in the last 72 hours.  Studies/Results: Ct Abdomen Pelvis Wo Contrast  Result Date: 06/15/2016 CLINICAL DATA:  Abdominal pain.  Relieve by lying on the right side. EXAM: CT ABDOMEN AND PELVIS WITHOUT CONTRAST  TECHNIQUE: Multidetector CT imaging of the abdomen and pelvis was performed following the standard protocol without IV contrast. COMPARISON:  None. FINDINGS: Lower chest: Nodular airspace disease of the left lung base. Hepatobiliary: No focal liver abnormality is seen. No gallstones, gallbladder wall thickening, or biliary dilatation. Pancreas: Unremarkable. No pancreatic ductal dilatation or surrounding inflammatory changes. Spleen: Normal in size without focal abnormality. Adrenals/Urinary Tract: Normal adrenal glands. Nonobstructing right renal calculus. Kidneys otherwise normal. No obstructive uropathy. Prior cystectomy with a right lower quadrant urostomy. Stomach/Bowel: No bowel wall thickening or bowel dilatation. No pneumatosis, pneumoperitoneum or portal venous gas. Rectal fecal impaction. Small hiatal hernia. Vascular/Lymphatic: Normal caliber abdominal aorta with atherosclerosis. No lymphadenopathy. Reproductive: Prior prostatectomy with surgical clips along the pelvic sidewalls. Other: No fluid collection or hematoma.  No abdominal wall hernia. Musculoskeletal: Chronic L1 vertebral body compression fracture. Mild chronic L3 vertebral body compression fracture. Degenerative disc disease with disc height loss at L3-4, L4-5 and L5-S1 with bilateral facet arthropathy. ORIF baby right intertrochanteric fracture. IMPRESSION: 1. No acute abdominal or pelvic pathology. 2. Prior cystectomy with in right lower quadrant urostomy. 3. Nonobstructing right renal calculus. Electronically Signed   By: Kathreen Devoid   On: 06/15/2016 16:39    Assessment/Plan: I reviewed his CT scan. Clinically he has no obvious abdominal source for his infection but he clearly remains of great concern  with an elevated lactic acid and white blood cell count 25,000. We will continue to follow his clinical examination. I do not see any acute surgical intervention required but I am very concerned about his lack of progress. I discussed  this plan with the patient and his wife.

## 2016-06-16 NOTE — Progress Notes (Signed)
Report given to  ICU nurse Ginger, RN. Bolus being administered at this time. Patient's wife and daughter at bedside.

## 2016-06-16 NOTE — Progress Notes (Signed)
Patient ID: Victor Parks, male   DOB: 03/23/1924, 82 y.o.   MRN: [id number]   [facility] [place] NAME: Victor Parks    MR#:  [id number]  DATE OF BIRTH:  04-24-1924  SUBJECTIVE:  CHIEF COMPLAINT:   Chief Complaint  Patient presents with  . Cough   Patient seen and examined this morning. He denies any complaints of abdominal pain but reports that he still has not had any bowel movement and reports that he has not been passing any gas. Continues to tolerate food and liquids. He reports his breathing is improved. Denies chest pain, shortness of breath, fevers or chills.  Patient did receive 2 units packed red blood cells yesterday following lab results that revealed acute anemia. Hemoglobin is better today patient denies any hematuria, hematochezia, melena but it is noted that he has not had a bowel movement.  CT scan of the abdomen done yesterday reveals rectal fecal impaction with no evidence of any small bowel obstruction or ileus. REVIEW OF SYSTEMS:  ROS CONSTITUTIONAL: No fever, fatigue or weakness.  EYES: No blurred or double vision.  EARS, NOSE, AND THROAT: No tinnitus or ear pain.  RESPIRATORY: Negative cough denies shortness of breath, wheezing or hemoptysis.  CARDIOVASCULAR: No chest pain, orthopnea, edema.  GASTROINTESTINAL: No nausea, vomiting, diarrhea.No abdominal pain. Positive constipation. GENITOURINARY: No dysuria, hematuria.  ENDOCRINE: No polyuria, nocturia,  HEMATOLOGY: No anemia, easy bruising or bleeding SKIN: No rash or lesion. MUSCULOSKELETAL: No joint pain or arthritis.  NEUROLOGIC: No tingling, numbness, weakness.  PSYCHIATRY: No anxiety or depression.  DRUG ALLERGIES:   Allergies  Allergen Reactions  . Ciprofloxacin Anaphylaxis   VITALS:  Blood pressure (!) 97/46, pulse (!) 105, temperature 97.4 F (36.3 C), temperature source Oral, resp. rate 18, height 5\' 9"  (1.753 m), weight 69.9 kg (154 lb),  SpO2 100 %. PHYSICAL EXAMINATION:  Physical Exam  Physical Exam  GENERAL: 82 y.o.-year-old patient lying in the bed with mild acute distress.  EYES: Pupils equal, round, reactive to light and accommodation. No scleral icterus. Extraocular muscles intact.  HEENT: Head atraumatic, normocephalic. Oropharynx and nasopharynx clear.  NECK: Supple, no jugular venous distention. No thyroid enlargement, no tenderness.  LUNGS: Normal breath sounds bilaterally, no wheezing, rales,rhonchi or crepitation. No use of accessory muscles of respiration.  CARDIOVASCULAR: S1, S2 normal. No murmurs, rubs, or gallops.  ABDOMEN: Soft, nontender, nondistended. Bowel sounds present. No organomegaly or mass.  EXTREMITIES: No pedal edema, cyanosis, or clubbing.  NEUROLOGIC: Cranial nerves II through XII are intact. Muscle strength 5/5 in all extremities. Sensation intact. Gait not checked.  PSYCHIATRIC: The patient is alert and oriented x 3.  SKIN: No obvious rash, lesion, or ulcer.  LABORATORY PANEL:   CBC  Recent Labs Lab 06/16/16 0210  WBC 25.7*  HGB 8.0*  HCT 23.8*  PLT 131*   ------------------------------------------------------------------------------------------------------------------ Chemistries   Recent Labs Lab 06/16/16 0210  NA 134*  K 4.6  CL 105  CO2 23  GLUCOSE 171*  BUN 84*  CREATININE 1.15  CALCIUM 7.5*  AST 22  ALT 12*  ALKPHOS 29*  BILITOT 0.7   RADIOLOGY:  Ct Abdomen Pelvis Wo Contrast  Result Date: 06/15/2016 CLINICAL DATA:  Abdominal pain.  Relieve by lying on the right side. EXAM: CT ABDOMEN AND PELVIS WITHOUT CONTRAST TECHNIQUE: Multidetector CT imaging of the abdomen and pelvis was performed following the standard protocol without IV contrast. COMPARISON:  None. FINDINGS: Lower chest: Nodular airspace disease of the  left lung base. Hepatobiliary: No focal liver abnormality is seen. No gallstones, gallbladder wall thickening, or biliary dilatation. Pancreas:  Unremarkable. No pancreatic ductal dilatation or surrounding inflammatory changes. Spleen: Normal in size without focal abnormality. Adrenals/Urinary Tract: Normal adrenal glands. Nonobstructing right renal calculus. Kidneys otherwise normal. No obstructive uropathy. Prior cystectomy with a right lower quadrant urostomy. Stomach/Bowel: No bowel wall thickening or bowel dilatation. No pneumatosis, pneumoperitoneum or portal venous gas. Rectal fecal impaction. Small hiatal hernia. Vascular/Lymphatic: Normal caliber abdominal aorta with atherosclerosis. No lymphadenopathy. Reproductive: Prior prostatectomy with surgical clips along the pelvic sidewalls. Other: No fluid collection or hematoma.  No abdominal wall hernia. Musculoskeletal: Chronic L1 vertebral body compression fracture. Mild chronic L3 vertebral body compression fracture. Degenerative disc disease with disc height loss at L3-4, L4-5 and L5-S1 with bilateral facet arthropathy. ORIF baby right intertrochanteric fracture. IMPRESSION: 1. No acute abdominal or pelvic pathology. 2. Prior cystectomy with in right lower quadrant urostomy. 3. Nonobstructing right renal calculus. Electronically Signed   By: Kathreen Devoid   On: 06/15/2016 16:39   ASSESSMENT AND PLAN:   82 year old elderly male patient with history of bladder cancer, hypertension, decubitus ulcer, UTI Admitted on 06/13/2016 for bronchitis and AKI.  1. Abdominal pain, intermittent-CT scan of the abdomen and pelvis done yesterday revealed no small bowel obstruction or ileus but it did show rectal fecal impaction. I did consult with gastroenterology who will see the patient. He suggested to start with tap water enemas. I have concern for GI bleeding secondary to acute anemia.  2. Sepsis unclear etiology - given leukocytosis, lactic acid and Hypotension-responding to IV fluid hydration. We'll monitor and hold antihypertensives for now.  Start empiric antibiotics Vanco Zosyn, panculture including  wounds, urine, blood and sputum. Infectious disease consult placed. I will also add C. difficile studies when we have stool available. Please note the decubitus wound is not able to be cultured. Recheck lactate today.  3. Anemia, significant decrease in hemoglobin and hematocrit from prior labs. Recheck CBC later today check FOBT when stool available. Iron studies within normal limits. Discontinue Lovenox until acute blood loss anemia is ruled out. GI consultation pending.  4.Rhonchi's, improving. to taper Solu-Medrol, nebulizer treatments as needed Continue azithromycin for a total of 5 days.  5. Acute kidney injury, improving Monitor renal function. IV fluids, avoid nephrotoxins  6..Decubitus ulcer-reposition patient every hours. Culture wounds as part of leukocytosis w/u.   7.Hypertension-stable. Continue close monitoring  All the records are reviewed and case discussed with Care Management/Social Worker. Management plans discussed with the patient, family and they are in agreement.  CODE STATUS: DO NOT RESUSCITATE  TOTAL TIME TAKING CARE OF THIS PATIENT: 79minutes.   More than 50% of the time was spent in counseling/coordination of care: YES  POSSIBLE D/C IN 3-4DAYS, DEPENDING ON CLINICAL CONDITION   Parminder Trapani D.O. on 06/16/2016 at 1:21 PM  Between 7am to 6pm - Pager - [phone]  After 6pm go to [email or web address] - Proofreader  [facility]  Office  [phone]  CC: Primary care physician; [name], MD  Note: This dictation was prepared with Dragon dictation along with smaller phrase technology. Any transcriptional errors that result from this process are unintentional.

## 2016-06-16 NOTE — Consult Note (Signed)
PULMONARY / CRITICAL CARE MEDICINE   Name: Victor Parks MRN: [id number] DOB: [birth year]    ADMISSION DATE:  06/13/2016   CONSULTATION DATE:  06/16/16  REFERRING MD: Dr. Ara Kussmaul  CHIEF COMPLAINT: Worsening acidosis, hypotension and AMS  HISTORY OF PRESENT ILLNESS:  This is an 82 y/o Caucasian male with a PMH of bladder cancer s/p ileal conduit, hypertension, generalized debility, protein calorie malnutrition, and recurrent urinary tract infections who presented initially to the ED with on 06/13/2016 with complaints of a nonproductive cough, dyspnea and wheezing. He had just completed an antibiotic course (Zithromax) for bronchitis. His chest x-ray at the time did not suggest any pneumonia and his WBC was normal. He was also noted to have a stage II pressure ulcer on his sacrum. He was admitted to the hospitalist service for continued treatment of acute bronchitis and was maintained on Zithromax and IV steroids as well as nebulizer treatments. Once admitted, patient started complaining of abdominal pain and described pain as worse when he coughs. At the time. He indicated that his last bowel movement was on 06/12/2016. An abdominal x-ray was obtained and suggested a partial small bowel obstruction versus ileus. Abdominal pain persisted and patient continued to have no bowel movements. Surgery was consulted and a CT abdomen was obtained. The CT [id number] omen was unremarkable and did not support the diagnosis of a small bowel obstruction. Hence, surgery recommended no surgical intervention. Patient's daughter also indicated that he had was diagnosed with a small bowel obstruction at [facility] last year and was treated conservatively with no surgical intervention. Patient has had complaints of intermittent abdominal pain for several months and has frequent episodes of constipation settings receiving treatment for bladder cancer.  On 06/16/2016, patient's hemoglobin dropped to 13.3 upon admission  to 6.3. He was transfused 2 units of packed red blood cells and GI was consulted for further evaluation. GI suggested that patient probably has some colonic bleeding from stercoral ulcers from constipation. They recommended tap water enemas to relieve fecal impaction. Post transfusion, patient's hemoglobin improved to 8 g/dl , but later on dropped to 6.2 in the course of the day. Patient became more lethargic and confused, hypotensive and hypoxic. His WBC upon admission was normal, but has been gradually trending up and suddenly increased from 25.7 on 06/16/2016 to 45. PCCM was consulted for further management. Patient remains confused and hypotensive with a blood pressure of 93/41. He is unable to characterize any pain, but continues to moan and groan.  PAST MEDICAL HISTORY :  He  has a past medical history of Bladder cancer ([place]); Decubitus ulcer; Hypertension; Muscle weakness; and UTI (urinary tract infection).  PAST SURGICAL HISTORY: He  has a past surgical history that includes none.  Allergies  Allergen Reactions  . Ciprofloxacin Anaphylaxis    No current facility-administered medications on file prior to encounter.    Current Outpatient Prescriptions on File Prior to Encounter  Medication Sig  . acetaminophen (TYLENOL) 325 MG tablet Take 650 mg by mouth every 6 (six) hours as needed.  . Ascorbic Acid (VITAMIN C) 1000 MG tablet Take 1,000 mg by mouth daily.  [name] aspirin EC 81 MG tablet Take 81 mg by mouth once. tuesday  . Calcium Carb-Cholecalciferol (CALCIUM 1000 + D) 1000-800 MG-UNIT TABS Take 1 tablet by mouth daily.  . cholestyramine light (PREVALITE) 4 g packet Take 4 g by mouth daily.  . Fluticasone-Salmeterol (ADVAIR) 250-50 MCG/DOSE AEPB Inhale 1 puff into the lungs 2 (two) times daily.  [name]  lactose free nutrition (BOOST) LIQD Take 1 Container by mouth daily.  [name] loperamide (IMODIUM A-D) 2 MG tablet Take 2 mg by mouth every 6 (six) hours as needed for diarrhea or loose stools.  .  Menthol-Zinc Oxide (CALMOSEPTINE) 0.44-20.6 % OINT Apply 1 application topically every 12 (twelve) hours as needed. To buttocks for rash  . Multiple Vitamin (DAILY VITE) TABS Take 1 tablet by mouth daily.  . potassium citrate (UROCIT-K) 10 MEQ (1080 MG) SR tablet Take 10 mEq by mouth 2 (two) times daily.  . simethicone (MYLICON) 80 MG chewable tablet Chew 80 mg by mouth every 6 (six) hours as needed for flatulence.  . traMADol (ULTRAM) 50 MG tablet Take 50 mg by mouth every 12 (twelve) hours as needed.  . vitamin B-12 (CYANOCOBALAMIN) 1000 MCG tablet Take 1,000 mcg by mouth daily.    FAMILY HISTORY:  His indicated that his mother is deceased. He indicated that his father is deceased.    SOCIAL HISTORY: He  reports that he has been smoking.  He has never used smokeless tobacco. He reports that he drinks alcohol. He reports that he does not use drugs.  REVIEW OF SYSTEMS:   Unable to obtain due to patient's mental status  SUBJECTIVE:   VITAL SIGNS: BP (!) 109/43 (BP Location: Left Arm)   Pulse 96   Temp 97.2 F (36.2 C) (Oral)   Resp 19   Ht 5\' 9"  (1.753 m)   Wt 154 lb (69.9 kg)   SpO2 96%   BMI 22.74 kg/m   HEMODYNAMICS:    VENTILATOR SETTINGS: FiO2 (%):  [28 %] 28 %  INTAKE / OUTPUT: I/O last 3 completed shifts: In: 2020.3 [I.V.:1062.3; Blood:708; IV Piggyback:250] Out: 3200 [Urine:3200]  PHYSICAL EXAMINATION: General: Acutely ill-looking, moderate to severe distress Neuro:  alert to person only, unable to follow basic commands, moves all extremities, speech is normal HEENT: [place]/AT, PERRLA, conjunctivae pale, oral mucosa dry, poor dentition, trachea midline, gag reflex intact Cardiovascular: RRR, C1 S2 audible. No murmur, regurg or gallop Lungs:  respirations are mildly labored, bilateral airflow, breath sounds diminished in the bases bilaterally, no wheezes or rhonchi Abdomen:  nondistended,  hypoactive bowel sounds,soft, no organomegaly, pain with deep palpation of  the left and right lower quadrants Musculoskeletal:  Normal range of motion, no visible deformities Skin:  pale, no rash or lesions  LABS:  BMET  Recent Labs Lab 06/15/16 0516 06/16/16 0210 06/16/16 2024  NA 136 134* 136  K 4.7 4.6 4.1  CL 101 105 108  CO2 26 23 19*  BUN 91* 84* 99*  CREATININE 1.23 1.15 1.41*  GLUCOSE 179* 171* 177*    Electrolytes  Recent Labs Lab 06/15/16 0516 06/16/16 0210 06/16/16 2024  CALCIUM 8.4* 7.5* 8.0*  MG  --   --  2.2  PHOS  --   --  2.7    CBC  Recent Labs Lab 06/15/16 1104 06/16/16 0210 06/16/16 1656  WBC 23.0* 25.7* 45.1*  HGB 6.3* 8.0* 6.2*  HCT 19.2* 23.8* 18.9*  PLT 220 131* 146*    Coag's  Recent Labs Lab 06/16/16 2017  INR 1.30    Sepsis Markers  Recent Labs Lab 06/16/16 1400 06/16/16 1656 06/16/16 2017  LATICACIDVEN 5.5* 6.1* 5.1*    ABG No results for input(s): PHART, PCO2ART, PO2ART in the last 168 hours.  Liver Enzymes  Recent Labs Lab 06/16/16 0210  AST 22  ALT 12*  ALKPHOS 29*  BILITOT 0.7  ALBUMIN 2.4*  Cardiac Enzymes  Recent Labs Lab 06/13/16 0254 06/16/16 2024  TROPONINI <0.03 <0.03    Glucose No results for input(s): GLUCAP in the last 168 hours.  Imaging Dg Abd 1 View  Result Date: 06/16/2016 CLINICAL DATA:  Pain, inability to give further history EXAM: ABDOMEN - 1 VIEW COMPARISON:  CT abdomen and pelvis performed 06/15/2016. FINDINGS: No bowel obstruction is evident. Enteric contrast fills the colon. Within limits for assessment on portable supine radiograph, no visible free air. Prior cystectomy for bladder cancer with RIGHT lower quadrant urostomy. Osteopenia. RIGHT femoral neck ORIF prosthesis, stable. IMPRESSION: No acute findings. Electronically Signed   By: Staci Righter M.D.   On: 06/16/2016 20:55   Dg Chest Port 1 View  Result Date: 06/16/2016 CLINICAL DATA:  Pain.  Hypertension EXAM: PORTABLE CHEST 1 VIEW COMPARISON:  June 13, 2016 FINDINGS: There is  no edema or consolidation. Heart size and pulmonary vascularity are normal. No adenopathy. There is atherosclerotic calcification aorta. No bone lesions. IMPRESSION: Aortic atherosclerosis.  No edema or consolidation. Electronically Signed   By: Lowella Grip III M.D.   On: 06/16/2016 20:57    STUDIES:   CTA abdomen and pelvis stat Abdominal and chest x-rays to rule out free air  CULTURES: Blood cultures obtained 06/15/2016: No growth to date Urine cultures obtained 06/15/2016: No growth to date  ANTIBIOTICS:  Zithromax 06/13/2016 and discontinued 06/16/2016 Zosyn 06/15/2016> Vancomycin 06/16/2016>  SIGNIFICANT EVENTS:  06/13/2016: ED with cough, dyspnea, wheezing, admitted with acute bronchitis. 06/14/2016: Started complaining of abdominal pain, extremity suggested small bowel obstruction versus ileus. 06/16/2016: Acute change in mental status, hypotension, lactic acidosis, persistent ileus and constipation, acute anemia and worsening abdominal pain; transferred to the ICU for further management  LINES/TUBES: Peripheral IVs Ileal conduit  DISCUSSION:  this is an 82 year old Caucasian male presenting with worsening lactic acidosis, acute blood loss anemia of GI source, but no clear etiology, hypovolemic and septic shock, and persistent ileus.  ASSESSMENT / PLAN:  PULMONARY A: Acute bronchitis-resolving, CXR normal Non-productive cough-resolving Moderate-severe lactic acidosis P:   D/c systemic steroids Prn duoneb for wheezing D/C zithromax Continue robitussin prn IV fluids and trend lactic acid level Monitor respiratory status closely Patient is a DO NOT RESUSCITATE but patient's daughter indicates that she will want patient intubated if intubation will improve his outcomes. We will continue to address CODE STATUS with patient's healthcare power of attorney.   CARDIOVASCULAR A:  Septic shock and hypovolemic shock secondary to bleeding and volume depletion from  dehydration P: Transferred to ICU and impingement Hemodynamic monitoring. per ICU protocol IV fluid resuscitation Will need central venous catheter for pressors if shock is refractory to fluid resuscitation Transfuse per protocol  RENAL A:   Acute kidney injury secondary to volume depletion P:   Trend creatinine IV fluid resuscitation Monitor and replace electrolytes  Renally dose medications  GASTROINTESTINAL A:   Acute abdominal pain-rule out small bowel obstruction versus bowel ischemia; previous CT scan negative for obstruction. P:   Gastroenterology and surgery following. Spoke extensively with both GI and surgery. GI recommends continuous attempts to relieve fecal impaction and belief that patient is probably having a colonic bleed due to severe fecal impaction. However, his rectal exam does not reveal any bright red blood. Stool for fecal occult blood is pending. Surgery recommends a CT abdomen and pelvis given worsening hemoglobin and hematocrit. Continue Fleet enemas and stool: Suppositories Maintain patient on clear liquids. Will await surgical recommendations after CTA abdomen and pelvis  HEMATOLOGIC A:  Acute blood loss anemia-hemoglobin level 6.3*transfusion of 2 units of packed red blood cells P:  Transfuse 2 units of packed red blood cells now. CBC posttransfusion Monitor closely for rectal bleeding bleeding  INFECTIOUS A:   Sepsis-most likely abdominal source; WBCs trending up P:   IV fluid resuscitation Broad-spectrum antibiotics. Follow-up cultures Monitor fever curve  ENDOCRINE A:   Steroid-induced hyperglycemia P:   Monitor blood glucose with BMP Will initiate sliding scale insulin coverage if blood glucose levels consistently greater than 150 mg/dL  NEUROLOGIC A:   Acute metabolic encephalopathy secondary to sepsis and hypovolemic shock P:   RASS goal: N/A Monitor mental status closely IV antibiotics and IV fluids   FAMILY  - Updates:  Patient's daughter and wife updated extensively at bedside and over the phone on patient's current status and treatment plan. CODE STATUS reviewed as of now, patient remains a full code.  - Inter-disciplinary family meet or Palliative Care meeting due by day 7  Case discussed with Dr. Alva Garnet and Dr. Haze Boyden. [facility] ANP-BC Pulmonary and Critical Care Medicine [facility] Pager [phone] or [phone] 06/16/2016, 9:15 PM   Merton Border, MD PCCM service Mobile [phone] Pager [phone] 06/17/2016

## 2016-06-16 NOTE — Progress Notes (Signed)
Waiting for bed at ICU .

## 2016-06-16 NOTE — Progress Notes (Signed)
Had 800 mL tap water enema. Waiting for a result.

## 2016-06-17 ENCOUNTER — Encounter: Payer: Self-pay | Admitting: Radiology

## 2016-06-17 ENCOUNTER — Inpatient Hospital Stay: Payer: Medicare Other

## 2016-06-17 DIAGNOSIS — I959 Hypotension, unspecified: Secondary | ICD-10-CM

## 2016-06-17 DIAGNOSIS — D62 Acute posthemorrhagic anemia: Secondary | ICD-10-CM

## 2016-06-17 DIAGNOSIS — E872 Acidosis: Secondary | ICD-10-CM

## 2016-06-17 DIAGNOSIS — A419 Sepsis, unspecified organism: Secondary | ICD-10-CM

## 2016-06-17 DIAGNOSIS — E43 Unspecified severe protein-calorie malnutrition: Secondary | ICD-10-CM

## 2016-06-17 DIAGNOSIS — R0603 Acute respiratory distress: Secondary | ICD-10-CM

## 2016-06-17 DIAGNOSIS — R6521 Severe sepsis with septic shock: Secondary | ICD-10-CM

## 2016-06-17 LAB — COMPREHENSIVE METABOLIC PANEL
ALBUMIN: 2.4 g/dL — AB (ref 3.5–5.0)
ALK PHOS: 34 U/L — AB (ref 38–126)
ALT: 13 U/L — ABNORMAL LOW (ref 17–63)
ANION GAP: 4 — AB (ref 5–15)
AST: 26 U/L (ref 15–41)
BILIRUBIN TOTAL: 0.7 mg/dL (ref 0.3–1.2)
BUN: 70 mg/dL — AB (ref 6–20)
CALCIUM: 7.6 mg/dL — AB (ref 8.9–10.3)
CO2: 20 mmol/L — ABNORMAL LOW (ref 22–32)
Chloride: 117 mmol/L — ABNORMAL HIGH (ref 101–111)
Creatinine, Ser: 1.13 mg/dL (ref 0.61–1.24)
GFR calc Af Amer: >60 mL/min (ref >60)
GFR calc non Af Amer: 56 mL/min — ABNORMAL LOW (ref >60)
GLUCOSE: 126 mg/dL — AB (ref 65–99)
Potassium: 3.7 mmol/L (ref 3.5–5.1)
Sodium: 141 mmol/L (ref 135–145)
TOTAL PROTEIN: 4.2 g/dL — AB (ref 6.5–8.1)

## 2016-06-17 LAB — TYPE AND SCREEN
ABO/RH(D): A POS
Antibody Screen: POSITIVE
UNIT DIVISION: 0
Unit division: 0
Unit division: 0

## 2016-06-17 LAB — CBC
HCT: 15.3 % — ABNORMAL LOW (ref 40.0–52.0)
HEMATOCRIT: 20.2 % — AB (ref 40.0–52.0)
HEMATOCRIT: 25.6 % — AB (ref 40.0–52.0)
HEMOGLOBIN: 6.7 g/dL — AB (ref 13.0–18.0)
HEMOGLOBIN: 5.1 g/dL — AB (ref 13.0–18.0)
Hemoglobin: 8.6 g/dL — ABNORMAL LOW (ref 13.0–18.0)
MCH: 29.4 pg (ref 26.0–34.0)
MCH: 30.3 pg (ref 26.0–34.0)
MCH: 30.5 pg (ref 26.0–34.0)
MCHC: 33.1 g/dL (ref 32.0–36.0)
MCHC: 33.7 g/dL (ref 32.0–36.0)
MCHC: 33.2 g/dL (ref 32.0–36.0)
MCV: 89 fL (ref 80.0–100.0)
MCV: 90.5 fL (ref 80.0–100.0)
MCV: 91.2 fL (ref 80.0–100.0)
PLATELETS: 138 10*3/uL — AB (ref 150–440)
Platelets: 123 10*3/uL — ABNORMAL LOW (ref 150–440)
Platelets: 121 10*3/uL — ABNORMAL LOW (ref 150–440)
RBC: 1.68 MIL/uL — ABNORMAL LOW (ref 4.40–5.90)
RBC: 2.27 MIL/uL — ABNORMAL LOW (ref 4.40–5.90)
RBC: 2.82 MIL/uL — ABNORMAL LOW (ref 4.40–5.90)
RDW: 15.7 % — ABNORMAL HIGH (ref 11.5–14.5)
RDW: 15.7 % — AB (ref 11.5–14.5)
RDW: 15.6 % — AB (ref 11.5–14.5)
WBC: 37.4 10*3/uL — ABNORMAL HIGH (ref 3.8–10.6)
WBC: 37.8 10*3/uL — ABNORMAL HIGH (ref 3.8–10.6)
WBC: 40.8 10*3/uL — ABNORMAL HIGH (ref 3.8–10.6)

## 2016-06-17 LAB — BASIC METABOLIC PANEL
Anion gap: 8 (ref 5–15)
BUN: 85 mg/dL — AB (ref 6–20)
CALCIUM: 7.7 mg/dL — AB (ref 8.9–10.3)
CHLORIDE: 113 mmol/L — AB (ref 101–111)
CO2: 18 mmol/L — ABNORMAL LOW (ref 22–32)
Creatinine, Ser: 1.22 mg/dL (ref 0.61–1.24)
GFR, EST AFRICAN AMERICAN: 59 mL/min — AB (ref >60)
GFR, EST NON AFRICAN AMERICAN: 51 mL/min — AB (ref >60)
Glucose, Bld: 161 mg/dL — ABNORMAL HIGH (ref 65–99)
Potassium: 3.8 mmol/L (ref 3.5–5.1)
SODIUM: 139 mmol/L (ref 135–145)

## 2016-06-17 LAB — PREPARE RBC (CROSSMATCH)

## 2016-06-17 LAB — LACTIC ACID, PLASMA
LACTIC ACID, VENOUS: 4.8 mmol/L — AB (ref 0.5–1.9)
Lactic Acid, Venous: 3.1 mmol/L (ref 0.5–1.9)

## 2016-06-17 LAB — MRSA PCR SCREENING: MRSA BY PCR: NEGATIVE

## 2016-06-17 LAB — PROCALCITONIN
PROCALCITONIN: 0.2 ng/mL
Procalcitonin: 0.2 ng/mL

## 2016-06-17 LAB — GLUCOSE, CAPILLARY: Glucose-Capillary: 103 mg/dL — ABNORMAL HIGH (ref 65–99)

## 2016-06-17 LAB — TROPONIN I: Troponin I: <0.03 ng/mL (ref <0.03)

## 2016-06-17 MED ORDER — IOPAMIDOL (ISOVUE-370) INJECTION 76%
100.0000 mL | Freq: Once | INTRAVENOUS | Status: AC | PRN
Start: 2016-06-17 — End: 2016-06-17
  Administered 2016-06-17: 100 mL via INTRAVENOUS

## 2016-06-17 MED ORDER — ORAL CARE MOUTH RINSE
15.0000 mL | Freq: Two times a day (BID) | OROMUCOSAL | Status: DC
  Administered 2016-06-17 – 2016-06-22 (×10): 15 mL via OROMUCOSAL

## 2016-06-17 NOTE — Progress Notes (Signed)
No distress. Denies dyspnea and abdominal pain  Vitals:   06/17/16 1000 06/17/16 1100 06/17/16 1200 06/17/16 1300  BP: (!) 113/44 (!) 115/55 (!) 125/58 109/69  Pulse: 81 72  70  Resp: (!) 30 16 15 16   Temp:    97.8 F (36.6 C)  TempSrc:    Axillary  SpO2: 100% 100%  99%  Weight:      Height:       NAD No JVD Chest clear RRR s M Abdomen soft, NT, diminished BS. Ileal loop bag noted Ext warm  BMP Latest Ref Rng & Units 06/17/2016 06/16/2016 06/16/2016  Glucose 65 - 99 mg/dL 126(H) 161(H) 177(H)  BUN 6 - 20 mg/dL 70(H) 85(H) 99(H)  Creatinine 0.61 - 1.24 mg/dL 1.13 1.22 1.41(H)  Sodium 135 - 145 mmol/L 141 139 136  Potassium 3.5 - 5.1 mmol/L 3.7 3.8 4.1  Chloride 101 - 111 mmol/L 117(H) 113(H) 108  CO2 22 - 32 mmol/L 20(L) 18(L) 19(L)  Calcium 8.9 - 10.3 mg/dL 7.6(L) 7.7(L) 8.0(L)   CBC Latest Ref Rng & Units 06/17/2016 06/17/2016 06/16/2016  WBC 3.8 - 10.6 K/uL 37.8(H) 37.4(H) 40.8(H)  Hemoglobin 13.0 - 18.0 g/dL 8.6(L) 6.7(L) 5.1(L)  Hematocrit 40.0 - 52.0 % 25.6(L) 20.2(L) 15.3(L)  Platelets 150 - 440 K/uL 121(L) 123(L) 138(L)   CXR: NACPD  CTAP: Hiatal hernia. Fecal impaction. No bowel obstruction or perforation. No evidence of bowel ischemia   IMPRESSION: 1) Hypotension, resolved 2) Lactic acidosis, resolving. Likely due to hypotension, anemia and vascular disease more than sepsis 3) fecal impaction - suspect chronic obstipation 4) AKI, resolving 5) Acute blood loss anemia - site unclear 6) Leukocytosis - unclear etiology. Concern for abdominal sepsis  PLAN/REC: Cont empiric antibiotics Cont resuscitation with IVFs and blood products Would transfuse to keep Hgb > 8.0 Cont current abx for now Gen Surg and GI medicine following DNR is appropriate. I do not think that intubation or ACLS would lead to any kind of outcome that would be considered favorable Change to SDU status and transfer back to [facility] 11/20 AM if remains hemodynamically stable  PCCM will  sign off. Please call if we can be of further assistance  Merton Border, MD PCCM service Mobile [phone] Pager [phone] 06/17/2016

## 2016-06-17 NOTE — Progress Notes (Signed)
Pt care assumed. Report received

## 2016-06-17 NOTE — Progress Notes (Signed)
Pharmacy Antibiotic Note  Victor Parks is a 82 y.o. male admitted on 06/13/2016 with sepsis of unkown source.   Pharmacy consulted for vancomycin and Zosyn dosing.    Plan: Continue Vancomycin 1000 IV every 24 hours.  Goal trough 15-20 mcg/mL.  Zosyn 3.375 g EI q 8 hours.   Height: 5\' 9"  (175.3 cm) Weight: 154 lb (69.9 kg) IBW/kg (Calculated) : 70.7  Temp (24hrs), Avg:97.9 F (36.6 C), Min:97.2 F (36.2 C), Max:98.4 F (36.9 C)   Recent Labs Lab 06/15/16 0516 06/15/16 1104 06/16/16 0210 06/16/16 1400 06/16/16 1656 06/16/16 2017 06/16/16 2024 06/16/16 2348 06/17/16 0809  WBC 23.8* 23.0* 25.7*  --  45.1*  --   --  40.8* 37.4*  CREATININE 1.23  --  1.15  --   --   --  1.41* 1.22 1.13  LATICACIDVEN  --  5.8*  --  5.5* 6.1* 5.1*  --  4.8*  --     Estimated Creatinine Clearance: 43.8 mL/min (by C-G formula based on SCr of 1.13 mg/dL).    Allergies  Allergen Reactions  . Ciprofloxacin Anaphylaxis    Antimicrobials this admission: Azithromycin  11/15 >>  vancomycin 11/17 >>  Vancomycin 11/17 >>  Dose adjustments this admission:   Microbiology results:  BCx: pending  UCx: pending   Sputum: pending  WCx: pending  Thank you for allowing pharmacy to be a part of this patient's care.  Tareka Jhaveri D 06/17/2016 9:20 AM

## 2016-06-17 NOTE — Progress Notes (Signed)
Pt admitted from [facility] d/t hypotension (sepsis) and anemia, pt and family oriented to ICU. Call bell within reach, pt in speciality bed. Hgb 5.1, 1 unit PRBC transfused. 2nd unit ordered, care to be assumed.

## 2016-06-17 NOTE — Progress Notes (Signed)
Soap suds enema attempted.  Pt mildly cooperative.  Awaiting results.

## 2016-06-17 NOTE — Progress Notes (Signed)
Gastroenterology Progress Note    Victor Parks 82 y.o. 09-06-1923   Subjective: Resting in bed. Denies abdominal pain. Small BM this morning per nursing.  Objective: Vital signs in last 24 hours: Vitals:   06/17/16 1000 06/17/16 1100  BP: (!) 113/44 (!) 115/55  Pulse: 81 72  Resp: (!) 30 16  Temp:    T 97.8  Physical Exam: Gen: elderly, frail, somnolent, thin, no acute distress CV: RRR Chest: CTA B Abd: diffuse tenderness without guarding, no rebound, +BS, soft, urostomy bag in place Ext: no edema  Lab Results:  Recent Labs  06/16/16 2024 06/16/16 2348 06/17/16 0809  NA 136 139 141  K 4.1 3.8 3.7  CL 108 113* 117*  CO2 19* 18* 20*  GLUCOSE 177* 161* 126*  BUN 99* 85* 70*  CREATININE 1.41* 1.22 1.13  CALCIUM 8.0* 7.7* 7.6*  MG 2.2  --   --   PHOS 2.7  --   --     Recent Labs  06/16/16 0210 06/17/16 0809  AST 22 26  ALT 12* 13*  ALKPHOS 29* 34*  BILITOT 0.7 0.7  PROT 4.5* 4.2*  ALBUMIN 2.4* 2.4*    Recent Labs  06/15/16 1104 06/16/16 0210  06/16/16 2348 06/17/16 0809  WBC 23.0* 25.7*  < > 40.8* 37.4*  NEUTROABS 19.4* 19.2*  --   --   --   HGB 6.3* 8.0*  < > 5.1* 6.7*  HCT 19.2* 23.8*  < > 15.3* 20.2*  MCV 93.1 88.0  < > 91.2 89.0  PLT 220 131*  < > 138* 123*  < > = values in this interval not displayed.  Recent Labs  06/16/16 2017  LABPROT 16.3*  INR 1.30      Assessment/Plan: Obstipation with fecal impaction and marked leukocytosis and lactic acidosis on unclear source. No free air on Xray yesterday. CT angiogram shows no bowel ischemia or acute findings. Fecal impaction again seen. Unclear why he has this marked leukocytosis. No peritoneal signs and no overt GI bleeding. Hgb low but no visible bleeding. Aggressive bowel regimen with disimpaction and discussed with ICU nurse who will try and disimpact pt. May need additional enemas but see if disimpaction is successful. Continue supportive care.   [place] C. 06/17/2016,  11:27 AM

## 2016-06-17 NOTE — Progress Notes (Signed)
Patient ID: Rutherford Groves, male   DOB: May 31, 1924, 82 y.o.   MRN: [id number]  This is a late entry for the date of 06/16/2016 at 6:50 PM I received a phone call from the nurse regarding patient's complaint of pain, physical exam with abdominal tenderness and generalized pallor, hypotensive and with lab abnormalities including acute anemia and severe worsening leukocytosis.  I discussed the case with the on-call intensivist who accepted admission to the intensive care unit for further management. I also signed out to the critical care nurse practitioner regarding the patient's status today.  Finally spoke with the patient's daughter Vaughan Basta to update her on his status with active issues being  Severe Sepsis with leukocytosis, elevated lactate. unclear source for which we're continuing antibiotics and IV fluids as well as following up cultures  Acute anemia with concern for GI bleeding for which we have held anticoagulants at this time and have requested GI comanagement.  Obstipation with elevated lactic acid and concern for possible ischemic bowel for which we have requested surgical comanagement.  Patient's daughter expresses that while he is a DO NOT RESUSCITATE she would like full treatment options including intubation, pressors and surgical intervention if necessary.

## 2016-06-18 LAB — COMPREHENSIVE METABOLIC PANEL
ALK PHOS: 37 U/L — AB (ref 38–126)
ALT: 15 U/L — ABNORMAL LOW (ref 17–63)
ANION GAP: 3 — AB (ref 5–15)
AST: 24 U/L (ref 15–41)
Albumin: 2.3 g/dL — ABNORMAL LOW (ref 3.5–5.0)
BILIRUBIN TOTAL: 0.7 mg/dL (ref 0.3–1.2)
BUN: 33 mg/dL — AB (ref 6–20)
CALCIUM: 7.1 mg/dL — AB (ref 8.9–10.3)
CO2: 23 mmol/L (ref 22–32)
Chloride: 113 mmol/L — ABNORMAL HIGH (ref 101–111)
Creatinine, Ser: 0.87 mg/dL (ref 0.61–1.24)
GFR calc Af Amer: >60 mL/min (ref >60)
GLUCOSE: 96 mg/dL (ref 65–99)
POTASSIUM: 3 mmol/L — AB (ref 3.5–5.1)
Sodium: 139 mmol/L (ref 135–145)
TOTAL PROTEIN: 4.2 g/dL — AB (ref 6.5–8.1)

## 2016-06-18 LAB — URINE CULTURE
Culture: >=100000 — AB
Special Requests: NORMAL

## 2016-06-18 LAB — CBC
HCT: 22.9 % — ABNORMAL LOW (ref 40.0–52.0)
HEMOGLOBIN: 7.7 g/dL — AB (ref 13.0–18.0)
MCH: 30.5 pg (ref 26.0–34.0)
MCHC: 33.6 g/dL (ref 32.0–36.0)
MCV: 90.7 fL (ref 80.0–100.0)
PLATELETS: 112 10*3/uL — AB (ref 150–440)
RBC: 2.53 MIL/uL — AB (ref 4.40–5.90)
RDW: 15.1 % — ABNORMAL HIGH (ref 11.5–14.5)
WBC: 28.1 10*3/uL — ABNORMAL HIGH (ref 3.8–10.6)

## 2016-06-18 LAB — LACTIC ACID, PLASMA: LACTIC ACID, VENOUS: 1.5 mmol/L (ref 0.5–1.9)

## 2016-06-18 LAB — PROCALCITONIN: Procalcitonin: 0.1 ng/mL

## 2016-06-18 LAB — DAT, POLYSPECIFIC AHG (ARMC ONLY): POLYSPECIFIC AHG TEST: NEGATIVE

## 2016-06-18 LAB — HEMOGLOBIN: HEMOGLOBIN: 7.8 g/dL — AB (ref 13.0–18.0)

## 2016-06-18 MED ORDER — IPRATROPIUM-ALBUTEROL 0.5-2.5 (3) MG/3ML IN SOLN
3.0000 mL | RESPIRATORY_TRACT | Status: DC | PRN
Start: 2016-06-18 — End: 2016-06-22

## 2016-06-18 MED ORDER — MOMETASONE FURO-FORMOTEROL FUM 100-5 MCG/ACT IN AERO
2.0000 | INHALATION_SPRAY | Freq: Two times a day (BID) | RESPIRATORY_TRACT | Status: DC
  Administered 2016-06-18 – 2016-06-22 (×8): 2 via RESPIRATORY_TRACT
  Filled 2016-06-18: qty 8.8

## 2016-06-18 NOTE — Progress Notes (Signed)
Patient ID: Victor Parks, male   DOB: [birth year], 82 y.o.   MRN: [id number]   [place] [place] NAME: Victor Parks    MR#:  [id number]  DATE OF BIRTH:  21-Sep-1923  SUBJECTIVE:  CHIEF COMPLAINT:   Chief Complaint  Patient presents with  . Cough   Transferred to ICU due to drop in hemoglobin and elevated lactic acid.  Today patient seems comfortable without any pain. Blood pressure normal. Received 2 units packed RBC this admission.  Had multiple bowel movements after an enema overnight without any blood or black stools  REVIEW OF SYSTEMS:  ROS CONSTITUTIONAL: No fever, fatigue or weakness.  EYES: No blurred or double vision.  EARS, NOSE, AND THROAT: No tinnitus or ear pain.  RESPIRATORY: Negative cough denies shortness of breath, wheezing or hemoptysis.  CARDIOVASCULAR: No chest pain, orthopnea, edema.  GASTROINTESTINAL: No nausea, vomiting, diarrhea.No abdominal pain. Positive constipation. GENITOURINARY: No dysuria, hematuria.  ENDOCRINE: No polyuria, nocturia,  HEMATOLOGY: No anemia, easy bruising or bleeding SKIN: No rash or lesion. MUSCULOSKELETAL: No joint pain or arthritis.  NEUROLOGIC: No tingling, numbness, weakness.  PSYCHIATRY: No anxiety or depression.  DRUG ALLERGIES:   Allergies  Allergen Reactions  . Ciprofloxacin Anaphylaxis   VITALS:  Blood pressure (!) 110/50, pulse 65, temperature 98.1 F (36.7 C), temperature source Oral, resp. rate 15, height 5\' 9"  (1.753 m), weight 69.9 kg (154 lb), SpO2 100 %. PHYSICAL EXAMINATION:  Physical Exam  Physical Exam  GENERAL: 82 y.o.-year-old patient lying in the bed with mild acute distress.  EYES: Pupils equal, round, reactive to light and accommodation. No scleral icterus. Extraocular muscles intact.  HEENT: Head atraumatic, normocephalic. Oropharynx and nasopharynx clear.  NECK: Supple, no jugular venous distention. No thyroid enlargement, no tenderness.   LUNGS: Normal breath sounds bilaterally, no wheezing, rales,rhonchi or crepitation. No use of accessory muscles of respiration.  CARDIOVASCULAR: S1, S2 normal. No murmurs, rubs, or gallops.  ABDOMEN: Soft, nontender, nondistended. Bowel sounds present. No organomegaly or mass.  EXTREMITIES: No pedal edema, cyanosis, or clubbing.  NEUROLOGIC: Cranial nerves II through XII are intact. Muscle strength 5/5 in all extremities. Sensation intact. Gait not checked.  PSYCHIATRIC: The patient is awake SKIN: No obvious rash, lesion, or ulcer.  LABORATORY PANEL:   CBC  Recent Labs Lab 06/18/16 0828  WBC 28.1*  HGB 7.7*  HCT 22.9*  PLT 112*   ------------------------------------------------------------------------------------------------------------------ Chemistries   Recent Labs Lab 06/16/16 2024  06/18/16 0828  NA 136  < > 139  K 4.1  < > 3.0*  CL 108  < > 113*  CO2 19*  < > 23  GLUCOSE 177*  < > 96  BUN 99*  < > 33*  CREATININE 1.41*  < > 0.87  CALCIUM 8.0*  < > 7.1*  MG 2.2  --   --   AST  --   < > 24  ALT  --   < > 15*  ALKPHOS  --   < > 37*  BILITOT  --   < > 0.7  < > = values in this interval not displayed. RADIOLOGY:  No results found. ASSESSMENT AND PLAN:   82 year old elderly male patient with history of bladder cancer, hypertension, decubitus ulcer, UTI Admitted on 06/13/2016 for bronchitis and AKI.  1. Abdominal pain, intermittent- Resolved Likely due to constipation. CT scan of the abdomen and pelvis showed nothing acute.  2. Sepsis unclear etiology Thought to be intra-abdominal source. CT  scan of the abdomen and pelvis normal. On broad-spectrum antibiotics. Cultures negative. We'll consult infectious disease for further antibiotic recommendations. WBC slowly trending down.  3. Anemia, significant decrease in hemoglobin and hematocrit 2 units blood transfused. Still anemic. No blood in stool noticed. We'll request oncology consult for hemolytic  cause. He also has associated significant leukocytosis with no clear source of infection and thrombocytopenia.  4.Rhonchi's, improving. to taper Solu-Medrol, nebulizer treatments as needed Continue azithromycin for a total of 5 days.  5. Acute kidney injury - resolved Iv fluids  6..Decubitus ulcer-reposition patient every hours.  7.Hypertension-stable. Continue close monitoring  8. Acute encephalopathy likely due to acute illness.  All the records are reviewed and case discussed with Care Management/Social Worker. Management plans discussed with the patient, family and they are in agreement.  CODE STATUS: DO NOT RESUSCITATE  TOTAL TIME TAKING CARE OF THIS PATIENT: 85minutes.   POSSIBLE D/C IN 1-2DAYS, DEPENDING ON CLINICAL CONDITION   Victor Parks R M.D. on 06/18/2016 at 9:51 AM  Between 7am to 6pm - Pager - [phone]  After 6pm go to [email or web address] - Proofreader  [facility]  Office  [phone]  CC: Primary care physician; Victor Eddy, MD  Note: This dictation was prepared with Dragon dictation along with smaller phrase technology. Any transcriptional errors that result from this process are unintentional.

## 2016-06-18 NOTE — Progress Notes (Signed)
PT Cancellation Note  Patient Details Name: Victor Parks MRN: [id number] DOB: [birth year]   Cancelled Treatment:    Reason Eval/Treat Not Completed: Other (comment).  Pt transferred to CCU 06/16/16.  D/t pt transferring to higher level of care, per PT protocol require new PT consult in order to continue therapy (will discontinue current PT order d/t this).  Please re-consult PT when pt is medically appropriate to participate in PT.    Raquel Sarna Veleda Mun 06/18/2016, 8:25 AM Leitha Bleak, [place]

## 2016-06-18 NOTE — Progress Notes (Signed)
CC: Abdominal pain Subjective: Patient seen in the ICU with nursing. It is unclear why he was on contact precautions. Discussed with nursing. Patient describes no abdominal pain at this point has not had no vomiting. He's had multiple stools last night. No labs were ordered for this morning nor drawn.  Objective: Vital signs in last 24 hours: Temp:  [97.8 F (36.6 C)-98.4 F (36.9 C)] 98.4 F (36.9 C) (11/20 0200) Pulse Rate:  [36-81] 60 (11/20 0600) Resp:  [11-30] 21 (11/20 0600) BP: (75-128)/(43-84) 101/57 (11/20 0600) SpO2:  [86 %-100 %] 100 % (11/20 0600) Last BM Date: 06/17/16  Intake/Output from previous day: 11/19 0701 - 11/20 0700 In: 1021 [I.V.:400; Blood:621] Out: 3300 [Urine:3300] Intake/Output this shift: No intake/output data recorded.  Physical exam:  Awake and alert in the ICU vital signs are reviewed heart rate is 64. Abdomen is soft and completely nontender no guarding no rebound no percussion tenderness. Abs are nontender no icterus no jaundice  Lab Results: CBC   Recent Labs  06/17/16 1225 06/18/16 0828  WBC 37.8* 28.1*  HGB 8.6* 7.7*  HCT 25.6* 22.9*  PLT 121* 112*   BMET  Recent Labs  06/16/16 2348 06/17/16 0809  NA 139 141  K 3.8 3.7  CL 113* 117*  CO2 18* 20*  GLUCOSE 161* 126*  BUN 85* 70*  CREATININE 1.22 1.13  CALCIUM 7.7* 7.6*   PT/INR  Recent Labs  06/16/16 2017  LABPROT 16.3*  INR 1.30   ABG  Recent Labs  06/16/16 2146  PHART 7.52*  HCO3 18.0*    Studies/Results: Dg Abd 1 View  Result Date: 06/16/2016 CLINICAL DATA:  Pain, inability to give further history EXAM: ABDOMEN - 1 VIEW COMPARISON:  CT abdomen and pelvis performed 06/15/2016. FINDINGS: No bowel obstruction is evident. Enteric contrast fills the colon. Within limits for assessment on portable supine radiograph, no visible free air. Prior cystectomy for bladder cancer with RIGHT lower quadrant urostomy. Osteopenia. RIGHT femoral neck ORIF prosthesis,  stable. IMPRESSION: No acute findings. Electronically Signed   By: Staci Righter M.D.   On: 06/16/2016 20:55   Dg Chest Port 1 View  Result Date: 06/16/2016 CLINICAL DATA:  Pain.  Hypertension EXAM: PORTABLE CHEST 1 VIEW COMPARISON:  June 13, 2016 FINDINGS: There is no edema or consolidation. Heart size and pulmonary vascularity are normal. No adenopathy. There is atherosclerotic calcification aorta. No bone lesions. IMPRESSION: Aortic atherosclerosis.  No edema or consolidation. Electronically Signed   By: Lowella Grip III M.D.   On: 06/16/2016 20:57   Ct Angio Abd/pel W/ And/or W/o  Result Date: 06/17/2016 CLINICAL DATA:  Hypotensive and tachycardia.  Worsening confusion. EXAM: CTA ABDOMEN AND PELVIS wITHOUT AND WITH CONTRAST TECHNIQUE: Multidetector CT imaging of the abdomen and pelvis was performed using the standard protocol during bolus administration of intravenous contrast. Multiplanar reconstructed images and MIPs were obtained and reviewed to evaluate the vascular anatomy. CONTRAST:  100 mL Isovue 370 intravenous COMPARISON:  06/15/2016 FINDINGS: VASCULAR Aorta: Normal caliber with moderate atherosclerotic calcification. No aneurysm. No dissection. No stenosis. Celiac: Patent without evidence of aneurysm, dissection, vasculitis or significant stenosis. SMA: Patent without evidence of aneurysm, dissection, vasculitis or significant stenosis. Renals: Both renal arteries are patent without evidence of aneurysm, dissection, vasculitis, fibromuscular dysplasia or significant stenosis. Each kidney has an accessory renal artery, also patent. IMA: Patent without evidence of aneurysm, dissection, vasculitis or significant stenosis. Inflow: Patent without evidence of aneurysm, dissection, vasculitis or significant stenosis. Proximal Outflow: Bilateral common  femoral and visualized portions of the superficial and profunda femoral arteries are patent without evidence of aneurysm, dissection,  vasculitis or significant stenosis. Veins: No obvious venous abnormality. Review of the MIP images confirms the above findings. NON-VASCULAR Lower chest: No acute abnormality. Hepatobiliary: No focal liver abnormality is seen. No gallstones, gallbladder wall thickening, or biliary dilatation. Pancreas: Unremarkable. No pancreatic ductal dilatation or surrounding inflammatory changes. Spleen: Normal in size without focal abnormality. Adrenals/Urinary Tract: Both adrenals are unremarkable. No suspicious renal parenchymal lesions. Ureters are unremarkable. Urostomy in the right lower quadrant, unremarkable. Prior cystectomy. Stomach/Bowel: Hiatal hernia. Stomach, small bowel and colon are otherwise remarkable only for large volume of stool distending the rectum, likely fecal impaction. Lymphatic: No pathologic adenopathy in the abdomen or pelvis. Reproductive: Unremarkable Other: No focal inflammatory changes are evident. No ascites. Small fat containing umbilical hernia. Musculoskeletal: Chronic unchanged compression of L1. Unchanged superior endplate impaction of L3. No significant skeletal lesions. IMPRESSION: VASCULAR Atherosclerotic calcification. No evidence of aneurysm, stenosis, dissection. NON-VASCULAR Hiatal hernia. Fecal impaction. No bowel obstruction or perforation. No evidence of bowel ischemia. Electronically Signed   By: Andreas Newport M.D.   On: 06/17/2016 03:32    Anti-infectives: Anti-infectives    Start     Dose/Rate Route Frequency Ordered Stop   06/16/16 0000  vancomycin (VANCOCIN) IVPB 1000 mg/200 mL premix     1,000 mg 200 mL/hr over 60 Minutes Intravenous Every 24 hours 06/15/16 1540     06/15/16 1630  vancomycin (VANCOCIN) IVPB 1000 mg/200 mL premix     1,000 mg 200 mL/hr over 60 Minutes Intravenous STAT 06/15/16 1540 06/15/16 2017   06/15/16 1630  piperacillin-tazobactam (ZOSYN) IVPB 3.375 g     3.375 g 12.5 mL/hr over 240 Minutes Intravenous Every 8 hours 06/15/16 1540      06/15/16 1000  azithromycin (ZITHROMAX) tablet 250 mg  Status:  Discontinued     250 mg Oral Daily 06/13/16 0441 06/16/16 1950   06/14/16 1000  azithromycin (ZITHROMAX) tablet 500 mg     500 mg Oral Daily 06/13/16 0441 06/14/16 1041   06/13/16 0415  azithromycin (ZITHROMAX) 500 mg in dextrose 5 % 250 mL IVPB     500 mg 250 mL/hr over 60 Minutes Intravenous  Once 06/13/16 0404 06/13/16 0531      Assessment/Plan:  After discussing with nursing Labs were ordered to [facility] blood cell count is back now and decreased to 28,000 from 37,000. Hemoglobin and hematocrit are decreased again slightly today. No source for bleeding noted. Awaiting metabolic profile and lactic acid this morning. I have no indication that this patient would have ischemic bowel as his abdomen is completely nontender and he has no subjective pain. We'll review after seeing his labs. Acute surgical process is currently.  Florene Glen, MD, FACS  06/18/2016

## 2016-06-18 NOTE — Consult Note (Signed)
Patient not available for consultation earlier today. Will initiate laboratory workup to assess for hemolysis and other etiologies of worsening anemia.  Appreciate consult, full consult to follow.

## 2016-06-18 NOTE — Progress Notes (Signed)
Patient transfered to room 111. Meghan, RN in room. Questions answered.  No further questions or complications during transfer.

## 2016-06-18 NOTE — Progress Notes (Signed)
Dr. Burt Knack called to review labs. No new orders at this time.

## 2016-06-18 NOTE — Progress Notes (Signed)
Chaplain rounded the unit to provide a compassionate presence and support to the patient. Patient appeared to be sleeping.  No visitors were at the bedside. Chaplain provided silent prayer. Minerva Fester [phone]

## 2016-06-19 ENCOUNTER — Inpatient Hospital Stay: Payer: Medicare Other

## 2016-06-19 DIAGNOSIS — Z87440 Personal history of urinary (tract) infections: Secondary | ICD-10-CM

## 2016-06-19 DIAGNOSIS — Z7951 Long term (current) use of inhaled steroids: Secondary | ICD-10-CM

## 2016-06-19 DIAGNOSIS — D72829 Elevated white blood cell count, unspecified: Secondary | ICD-10-CM

## 2016-06-19 DIAGNOSIS — I10 Essential (primary) hypertension: Secondary | ICD-10-CM

## 2016-06-19 DIAGNOSIS — F17210 Nicotine dependence, cigarettes, uncomplicated: Secondary | ICD-10-CM

## 2016-06-19 DIAGNOSIS — R600 Localized edema: Secondary | ICD-10-CM

## 2016-06-19 DIAGNOSIS — Z8551 Personal history of malignant neoplasm of bladder: Secondary | ICD-10-CM

## 2016-06-19 DIAGNOSIS — R5383 Other fatigue: Secondary | ICD-10-CM

## 2016-06-19 DIAGNOSIS — R0602 Shortness of breath: Secondary | ICD-10-CM

## 2016-06-19 DIAGNOSIS — Z7982 Long term (current) use of aspirin: Secondary | ICD-10-CM

## 2016-06-19 DIAGNOSIS — R05 Cough: Secondary | ICD-10-CM

## 2016-06-19 DIAGNOSIS — Z8619 Personal history of other infectious and parasitic diseases: Secondary | ICD-10-CM

## 2016-06-19 DIAGNOSIS — Z79899 Other long term (current) drug therapy: Secondary | ICD-10-CM

## 2016-06-19 DIAGNOSIS — D696 Thrombocytopenia, unspecified: Secondary | ICD-10-CM

## 2016-06-19 DIAGNOSIS — D649 Anemia, unspecified: Secondary | ICD-10-CM

## 2016-06-19 DIAGNOSIS — R531 Weakness: Secondary | ICD-10-CM

## 2016-06-19 LAB — TYPE AND SCREEN
ABO/RH(D): A POS
Antibody Screen: NEGATIVE
Unit division: 0
Unit division: 0

## 2016-06-19 LAB — CBC WITH DIFFERENTIAL/PLATELET
BASOS ABS: 0 10*3/uL (ref 0–0.1)
BLASTS: 0 %
Band Neutrophils: 2 %
Basophils Relative: 0 %
EOS PCT: 1 %
Eosinophils Absolute: 0.2 10*3/uL (ref 0–0.7)
HEMATOCRIT: 21.5 % — AB (ref 40.0–52.0)
HEMOGLOBIN: 7.3 g/dL — AB (ref 13.0–18.0)
LYMPHS ABS: 2.9 10*3/uL (ref 1.0–3.6)
Lymphocytes Relative: 14 %
MCH: 31.3 pg (ref 26.0–34.0)
MCHC: 33.9 g/dL (ref 32.0–36.0)
MCV: 92.2 fL (ref 80.0–100.0)
METAMYELOCYTES PCT: 6 %
MYELOCYTES: 1 %
Monocytes Absolute: 0.6 10*3/uL (ref 0.2–1.0)
Monocytes Relative: 3 %
NEUTROS PCT: 73 %
NRBC: 2 /100{WBCs} — AB
Neutro Abs: 17.3 10*3/uL — ABNORMAL HIGH (ref 1.4–6.5)
Other: 0 %
PLATELETS: 133 10*3/uL — AB (ref 150–440)
Promyelocytes Absolute: 0 %
RBC: 2.33 MIL/uL — AB (ref 4.40–5.90)
RDW: 15 % — ABNORMAL HIGH (ref 11.5–14.5)
WBC: 21 10*3/uL — AB (ref 3.8–10.6)

## 2016-06-19 LAB — LACTATE DEHYDROGENASE: LDH: 209 U/L — AB (ref 98–192)

## 2016-06-19 LAB — COMPREHENSIVE METABOLIC PANEL
ALK PHOS: 45 U/L (ref 38–126)
ALT: 14 U/L — AB (ref 17–63)
AST: 19 U/L (ref 15–41)
Albumin: 2.3 g/dL — ABNORMAL LOW (ref 3.5–5.0)
Anion gap: 4 — ABNORMAL LOW (ref 5–15)
BILIRUBIN TOTAL: 0.6 mg/dL (ref 0.3–1.2)
BUN: 20 mg/dL (ref 6–20)
CALCIUM: 7.1 mg/dL — AB (ref 8.9–10.3)
CO2: 26 mmol/L (ref 22–32)
CREATININE: 0.9 mg/dL (ref 0.61–1.24)
Chloride: 108 mmol/L (ref 101–111)
Glucose, Bld: 104 mg/dL — ABNORMAL HIGH (ref 65–99)
Potassium: 3.1 mmol/L — ABNORMAL LOW (ref 3.5–5.1)
Sodium: 138 mmol/L (ref 135–145)
TOTAL PROTEIN: 4.4 g/dL — AB (ref 6.5–8.1)

## 2016-06-19 LAB — CULTURE, BLOOD (SINGLE): CULTURE: NO GROWTH

## 2016-06-19 LAB — VITAMIN B12: Vitamin B-12: 1924 pg/mL — ABNORMAL HIGH (ref 180–914)

## 2016-06-19 LAB — HEMOGLOBIN: Hemoglobin: 7.3 g/dL — ABNORMAL LOW (ref 13.0–18.0)

## 2016-06-19 MED ORDER — POTASSIUM CHLORIDE CRYS ER 20 MEQ PO TBCR
40.0000 meq | EXTENDED_RELEASE_TABLET | ORAL | Status: AC
  Administered 2016-06-19 (×2): 40 meq via ORAL
  Filled 2016-06-19 (×2): qty 2

## 2016-06-19 NOTE — Progress Notes (Signed)
Paged Dr. Darvin Neighbours to notify him that pt's stool appeared to be dark and tarry when pt had a BM this a.m.

## 2016-06-19 NOTE — Consult Note (Addendum)
[facility] Infectious Disease     Reason for Consult: Sepsis, UTI    Referring Physician: Boykin Reaper Date of Admission:  06/13/2016   Principal Problem:   Dyspnea Active Problems:   Pressure injury of skin   Protein-calorie malnutrition, severe   Abdominal pain   Septic shock (HCC)   Severe sepsis with septic shock (HCC)   Acute blood loss anemia  HPI: Victor Parks is a 82 y.o. male admitted 11/15 from NH with cough and wheeze and found to have initially neg cxr, nml wbc at 8.9  and no fevres. Started on solumedrol and nebs. Developed increased wbc, increased lactate and acute onset abd pain and distention. Seen by GI and surgery. Developed hypotension and AMS and admitted to ICU.  CT abd neg for SBO. He also developed a drop in HGB from 13-> 6 and required transfusions but no evidence GI blood loss.  Currently out of unit, more alert, interactive. Family at bedside. He denies cp, sob, abd pain, diarrhea RN reports had large dark stool today.   Past Medical History:  Diagnosis Date  . Bladder cancer ([place])   . Decubitus ulcer   . Hypertension   . Muscle weakness   . UTI (urinary tract infection)    Past Surgical History:  Procedure Laterality Date  . ILEAL POUCH     s/p bladder cancer  . none     Social History  Substance Use Topics  . Smoking status: Current Some Day Smoker  . Smokeless tobacco: Never Used  . Alcohol use Yes   History reviewed. No pertinent family history.  Allergies:  Allergies  Allergen Reactions  . Ciprofloxacin Anaphylaxis    Current antibiotics: Antibiotics Given (last 72 hours)    Date/Time Action Medication Dose Rate   06/17/16 0112 Given   vancomycin (VANCOCIN) IVPB 1000 mg/200 mL premix 1,000 mg 200 mL/hr   06/17/16 0207 Given   piperacillin-tazobactam (ZOSYN) IVPB 3.375 g 3.375 g 12.5 mL/hr   06/17/16 1214 Given   piperacillin-tazobactam (ZOSYN) IVPB 3.375 g 3.375 g 12.5 mL/hr   06/17/16 1839 Given   piperacillin-tazobactam  (ZOSYN) IVPB 3.375 g 3.375 g 12.5 mL/hr   06/18/16 0024 Given   vancomycin (VANCOCIN) IVPB 1000 mg/200 mL premix 1,000 mg 200 mL/hr   06/18/16 0133 Given   piperacillin-tazobactam (ZOSYN) IVPB 3.375 g 3.375 g 12.5 mL/hr   06/18/16 1024 Given   piperacillin-tazobactam (ZOSYN) IVPB 3.375 g 3.375 g 12.5 mL/hr   06/18/16 1827 Given   piperacillin-tazobactam (ZOSYN) IVPB 3.375 g 3.375 g 12.5 mL/hr   06/19/16 0202 Given   piperacillin-tazobactam (ZOSYN) IVPB 3.375 g 3.375 g 12.5 mL/hr   06/19/16 1015 Given   piperacillin-tazobactam (ZOSYN) IVPB 3.375 g 3.375 g 12.5 mL/hr      MEDICATIONS: . sodium chloride   Intravenous Once  . aspirin EC  81 mg Oral Once  . calcium-vitamin D  2 tablet Oral Daily  . feeding supplement  1 Container Oral TID BM  . mouth rinse  15 mL Mouth Rinse BID  . mometasone-formoterol  2 puff Inhalation BID  . multivitamin with minerals  1 tablet Oral Daily  . piperacillin-tazobactam (ZOSYN)  IV  3.375 g Intravenous Q8H  . potassium chloride  40 mEq Oral Q4H  . potassium citrate  10 mEq Oral BID  . sodium chloride flush  3 mL Intravenous Q12H  . vitamin C  1,000 mg Oral Daily    Review of Systems - 11 systems reviewed and negative per HPI  OBJECTIVE: Temp:  [97.6 F (36.4 C)-98.2 F (36.8 C)] 97.6 F (36.4 C) (11/21 1342) Pulse Rate:  [70-82] 78 (11/21 1342) Resp:  [16-20] 20 (11/21 1342) BP: (90-109)/(44-71) 90/44 (11/21 1342) SpO2:  [99 %-100 %] 99 % (11/21 1342) Physical Exam  Constitutional: He is frail, lying in bed, but able to converse. Some memory loss.  HENT: anicteric, eomi Mouth/Throat: Oropharynx is clear and dry . No oropharyngeal exudate.  Cardiovascular: Normal rate, regular rhythm and normal heart sounds.  Pulmonary/Chest: Effort normal and breath sounds normal. Abdominal: Soft. Bowel sounds are normal. He exhibits no distension. There is no tenderness.  Urostomy site RLQ wnl, clear yellow urine Lymphadenopathy: He has no cervical  adenopathy.  Neurological: He is alert and interactive. Moves all 4 EXT LUE with 2+ edema Skin: very dry scaly skin on arms and legs. Some ecchymosis.  Psychiatric: He has a normal mood and affect. His behavior is normal.     LABS: Results for orders placed or performed during the hospital encounter of 06/13/16 (from the past 48 hour(s))  Procalcitonin     Status: None   Collection Time: 06/18/16  6:02 AM  Result Value Ref Range   Procalcitonin 0.10 ng/mL    Comment:        Interpretation: PCT (Procalcitonin) <= 0.5 ng/mL: Systemic infection (sepsis) is not likely. Local bacterial infection is possible. (NOTE)         ICU PCT Algorithm               Non ICU PCT Algorithm    ----------------------------     ------------------------------         PCT < 0.25 ng/mL                 PCT < 0.1 ng/mL     Stopping of antibiotics            Stopping of antibiotics       strongly encouraged.               strongly encouraged.    ----------------------------     ------------------------------       PCT level decrease by               PCT < 0.25 ng/mL       >= 80% from peak PCT       OR PCT 0.25 - 0.5 ng/mL          Stopping of antibiotics                                             encouraged.     Stopping of antibiotics           encouraged.    ----------------------------     ------------------------------       PCT level decrease by              PCT >= 0.25 ng/mL       < 80% from peak PCT        AND PCT >= 0.5 ng/mL            Continuin g antibiotics  encouraged.       Continuing antibiotics            encouraged.    ----------------------------     ------------------------------     PCT level increase compared          PCT > 0.5 ng/mL         with peak PCT AND          PCT >= 0.5 ng/mL             Escalation of antibiotics                                          strongly encouraged.      Escalation of antibiotics        strongly  encouraged.   Comprehensive metabolic panel     Status: Abnormal   Collection Time: 06/18/16  8:28 AM  Result Value Ref Range   Sodium 139 135 - 145 mmol/L   Potassium 3.0 (L) 3.5 - 5.1 mmol/L   Chloride 113 (H) 101 - 111 mmol/L   CO2 23 22 - 32 mmol/L   Glucose, Bld 96 65 - 99 mg/dL   BUN 33 (H) 6 - 20 mg/dL   Creatinine, Ser 0.87 0.61 - 1.24 mg/dL   Calcium 7.1 (L) 8.9 - 10.3 mg/dL   Total Protein 4.2 (L) 6.5 - 8.1 g/dL   Albumin 2.3 (L) 3.5 - 5.0 g/dL   AST 24 15 - 41 U/L   ALT 15 (L) 17 - 63 U/L   Alkaline Phosphatase 37 (L) 38 - 126 U/L   Total Bilirubin 0.7 0.3 - 1.2 mg/dL   GFR calc non Af Amer >60 >60 mL/min   GFR calc Af Amer >60 >60 mL/min    Comment: (NOTE) The eGFR has been calculated using the CKD EPI equation. This calculation has not been validated in all clinical situations. eGFR's persistently <60 mL/min signify possible Chronic Kidney Disease.    Anion gap 3 (L) 5 - 15  Lactic acid, plasma     Status: None   Collection Time: 06/18/16  8:28 AM  Result Value Ref Range   Lactic Acid, Venous 1.5 0.5 - 1.9 mmol/L  CBC     Status: Abnormal   Collection Time: 06/18/16  8:28 AM  Result Value Ref Range   WBC 28.1 (H) 3.8 - 10.6 K/uL   RBC 2.53 (L) 4.40 - 5.90 MIL/uL   Hemoglobin 7.7 (L) 13.0 - 18.0 g/dL   HCT 22.9 (L) 40.0 - 52.0 %   MCV 90.7 80.0 - 100.0 fL   MCH 30.5 26.0 - 34.0 pg   MCHC 33.6 32.0 - 36.0 g/dL   RDW 15.1 (H) 11.5 - 14.5 %   Platelets 112 (L) 150 - 440 K/uL  Hemoglobin     Status: Abnormal   Collection Time: 06/18/16  4:15 PM  Result Value Ref Range   Hemoglobin 7.8 (L) 13.0 - 18.0 g/dL  DAT, polyspecific, AHG ([facility] only)     Status: None   Collection Time: 06/18/16  4:16 PM  Result Value Ref Range   Polyspecific AHG test NEG   CBC with Differential/Platelet     Status: Abnormal   Collection Time: 06/19/16  9:46 AM  Result Value Ref Range   WBC 21.0 (H) 3.8 - 10.6 K/uL   RBC 2.33 (L) 4.40 - 5.90 MIL/uL  Hemoglobin 7.3 (L) 13.0 -  18.0 g/dL   HCT 21.5 (L) 40.0 - 52.0 %   MCV 92.2 80.0 - 100.0 fL   MCH 31.3 26.0 - 34.0 pg   MCHC 33.9 32.0 - 36.0 g/dL   RDW 15.0 (H) 11.5 - 14.5 %   Platelets 133 (L) 150 - 440 K/uL   Neutrophils Relative % 73 %   Lymphocytes Relative 14 %   Monocytes Relative 3 %   Eosinophils Relative 1 %   Basophils Relative 0 %   Band Neutrophils 2 %   Metamyelocytes Relative 6 %   Myelocytes 1 %   Promyelocytes Absolute 0 %   Blasts 0 %   nRBC 2 (H) 0 /100 WBC   Other 0 %   Neutro Abs 17.3 (H) 1.4 - 6.5 K/uL   Lymphs Abs 2.9 1.0 - 3.6 K/uL   Monocytes Absolute 0.6 0.2 - 1.0 K/uL   Eosinophils Absolute 0.2 0 - 0.7 K/uL   Basophils Absolute 0.0 0 - 0.1 K/uL   RBC Morphology POLYCHROMASIA PRESENT     Comment: MIXED RBC POPULATION  Comprehensive metabolic panel     Status: Abnormal   Collection Time: 06/19/16  9:46 AM  Result Value Ref Range   Sodium 138 135 - 145 mmol/L   Potassium 3.1 (L) 3.5 - 5.1 mmol/L   Chloride 108 101 - 111 mmol/L   CO2 26 22 - 32 mmol/L   Glucose, Bld 104 (H) 65 - 99 mg/dL   BUN 20 6 - 20 mg/dL   Creatinine, Ser 0.90 0.61 - 1.24 mg/dL   Calcium 7.1 (L) 8.9 - 10.3 mg/dL   Total Protein 4.4 (L) 6.5 - 8.1 g/dL   Albumin 2.3 (L) 3.5 - 5.0 g/dL   AST 19 15 - 41 U/L   ALT 14 (L) 17 - 63 U/L   Alkaline Phosphatase 45 38 - 126 U/L   Total Bilirubin 0.6 0.3 - 1.2 mg/dL   GFR calc non Af Amer >60 >60 mL/min   GFR calc Af Amer >60 >60 mL/min    Comment: (NOTE) The eGFR has been calculated using the CKD EPI equation. This calculation has not been validated in all clinical situations. eGFR's persistently <60 mL/min signify possible Chronic Kidney Disease.    Anion gap 4 (L) 5 - 15  Lactate dehydrogenase     Status: Abnormal   Collection Time: 06/19/16  9:46 AM  Result Value Ref Range   LDH 209 (H) 98 - 192 U/L   No components found for: ESR, C REACTIVE PROTEIN MICRO: Recent Results (from the past 720 hour(s))  Urine culture     Status: Abnormal    Collection Time: 05/29/16  8:21 AM  Result Value Ref Range Status   Specimen Description URINE, RANDOM  Final   Special Requests NONE  Final   Culture MULTIPLE SPECIES PRESENT, SUGGEST RECOLLECTION (A)  Final   Report Status 05/30/2016 FINAL  Final  Culture, blood (single) w Reflex to ID Panel     Status: None   Collection Time: 06/14/16 12:59 PM  Result Value Ref Range Status   Specimen Description BLOOD RIGHT ARM  Final   Special Requests BOTTLES DRAWN AEROBIC AND ANAEROBIC [place]  Final   Culture NO GROWTH 5 DAYS  Final   Report Status 06/19/2016 FINAL  Final  Urine culture     Status: Abnormal   Collection Time: 06/15/16  3:25 PM  Result Value Ref Range Status   Specimen Description  URINE, RANDOM  Final   Special Requests Normal  Final   Culture (A)  Final    >=100,000 COLONIES/mL ENTEROCOCCUS FAECALIS >=100,000 COLONIES/mL CITROBACTER FREUNDII    Report Status 06/18/2016 FINAL  Final   Organism ID, Bacteria ENTEROCOCCUS FAECALIS (A)  Final   Organism ID, Bacteria CITROBACTER FREUNDII (A)  Final      Susceptibility   Citrobacter freundii - MIC*    CEFAZOLIN >=64 RESISTANT Resistant     CEFTRIAXONE <=1 SENSITIVE Sensitive     CIPROFLOXACIN <=0.25 SENSITIVE Sensitive     GENTAMICIN <=1 SENSITIVE Sensitive     IMIPENEM <=0.25 SENSITIVE Sensitive     NITROFURANTOIN <=16 SENSITIVE Sensitive     TRIMETH/SULFA >=320 RESISTANT Resistant     PIP/TAZO <=4 SENSITIVE Sensitive     * >=100,000 COLONIES/mL CITROBACTER FREUNDII   Enterococcus faecalis - MIC*    AMPICILLIN <=2 SENSITIVE Sensitive     LEVOFLOXACIN >=8 RESISTANT Resistant     NITROFURANTOIN <=16 SENSITIVE Sensitive     VANCOMYCIN 1 SENSITIVE Sensitive     * >=100,000 COLONIES/mL ENTEROCOCCUS FAECALIS  CULTURE, BLOOD (ROUTINE X 2) w Reflex to ID Panel     Status: None (Preliminary result)   Collection Time: 06/15/16  3:35 PM  Result Value Ref Range Status   Specimen Description BLOOD LEFT HAND  Final   Special  Requests   Final    BOTTLES DRAWN AEROBIC AND ANAEROBIC  AER 12CC ANA Day   Culture NO GROWTH 4 DAYS  Final   Report Status PENDING  Incomplete  CULTURE, BLOOD (ROUTINE X 2) w Reflex to ID Panel     Status: None (Preliminary result)   Collection Time: 06/15/16  3:35 PM  Result Value Ref Range Status   Specimen Description BLOOD RIGHT HAND  Final   Special Requests   Final    BOTTLES DRAWN AEROBIC AND ANAEROBIC  AER [place] ANA 6CC   Culture NO GROWTH 4 DAYS  Final   Report Status PENDING  Incomplete  MRSA PCR Screening     Status: None   Collection Time: 06/16/16 10:35 PM  Result Value Ref Range Status   MRSA by PCR NEGATIVE NEGATIVE Final    Comment:        The GeneXpert MRSA Assay (FDA approved for NASAL specimens only), is one component of a comprehensive MRSA colonization surveillance program. It is not intended to diagnose MRSA infection nor to guide or monitor treatment for MRSA infections.     IMAGING: Ct Abdomen Pelvis Wo Contrast  Result Date: 06/15/2016 CLINICAL DATA:  Abdominal pain.  Relieve by lying on the right side. EXAM: CT ABDOMEN AND PELVIS WITHOUT CONTRAST TECHNIQUE: Multidetector CT imaging of the abdomen and pelvis was performed following the standard protocol without IV contrast. COMPARISON:  None. FINDINGS: Lower chest: Nodular airspace disease of the left lung base. Hepatobiliary: No focal liver abnormality is seen. No gallstones, gallbladder wall thickening, or biliary dilatation. Pancreas: Unremarkable. No pancreatic ductal dilatation or surrounding inflammatory changes. Spleen: Normal in size without focal abnormality. Adrenals/Urinary Tract: Normal adrenal glands. Nonobstructing right renal calculus. Kidneys otherwise normal. No obstructive uropathy. Prior cystectomy with a right lower quadrant urostomy. Stomach/Bowel: No bowel wall thickening or bowel dilatation. No pneumatosis, pneumoperitoneum or portal venous gas. Rectal fecal impaction. Small hiatal  hernia. Vascular/Lymphatic: Normal caliber abdominal aorta with atherosclerosis. No lymphadenopathy. Reproductive: Prior prostatectomy with surgical clips along the pelvic sidewalls. Other: No fluid collection or hematoma.  No abdominal wall hernia. Musculoskeletal: Chronic L1 vertebral  body compression fracture. Mild chronic L3 vertebral body compression fracture. Degenerative disc disease with disc height loss at L3-4, L4-5 and L5-S1 with bilateral facet arthropathy. ORIF baby right intertrochanteric fracture. IMPRESSION: 1. No acute abdominal or pelvic pathology. 2. Prior cystectomy with in right lower quadrant urostomy. 3. Nonobstructing right renal calculus. Electronically Signed   By: Kathreen Devoid   On: 06/15/2016 16:39   Dg Chest 1 View  Result Date: 06/13/2016 CLINICAL DATA:  Acute onset dyspnea tonight. EXAM: CHEST 1 VIEW COMPARISON:  05/29/2016 FINDINGS: There is stable mild left hemidiaphragm elevation and minimal linear basilar scarring or atelectasis. No confluent airspace consolidation. No effusions. Normal pulmonary vasculature. Hilar, mediastinal and cardiac contours are unremarkable and unchanged. IMPRESSION: No active disease. Electronically Signed   By: Andreas Newport M.D.   On: 06/13/2016 03:14   Dg Abd 1 View  Result Date: 06/16/2016 CLINICAL DATA:  Pain, inability to give further history EXAM: ABDOMEN - 1 VIEW COMPARISON:  CT abdomen and pelvis performed 06/15/2016. FINDINGS: No bowel obstruction is evident. Enteric contrast fills the colon. Within limits for assessment on portable supine radiograph, no visible free air. Prior cystectomy for bladder cancer with RIGHT lower quadrant urostomy. Osteopenia. RIGHT femoral neck ORIF prosthesis, stable. IMPRESSION: No acute findings. Electronically Signed   By: Staci Righter M.D.   On: 06/16/2016 20:55   Ct Abdomen Pelvis W Contrast  Result Date: 05/29/2016 CLINICAL DATA:  RIGHT chest, RIGHT upper quadrant, epigastric abdominal pain/  discomfort beginning last night, history of bladder cancer post cystectomy and urostomy, leukocytosis, anemia EXAM: CT ABDOMEN AND PELVIS WITH CONTRAST TECHNIQUE: Multidetector CT imaging of the abdomen and pelvis was performed using the standard protocol following bolus administration of intravenous contrast. Sagittal and coronal MPR images reconstructed from axial data set. CONTRAST:  63m ISOVUE-300 IOPAMIDOL (ISOVUE-300) INJECTION 61% IV. Dilute oral contrast. COMPARISON:  None FINDINGS: Lower chest: Bibasilar emphysematous changes Hepatobiliary: Contracted gallbladder. Nonspecific 5 mm low-attenuation lesion RIGHT lobe liver image 22. Remainder of liver unremarkable. Pancreas: Normal appearance Spleen: Normal appearance Adrenals/Urinary Tract: Small BILATERAL intermediate attenuation renal lesions as well as a small probable RIGHT renal cyst. Unremarkable adrenal glands. Fullness of BILATERAL renal collecting systems and ureters extending to RIGHT lower quadrant urostomy which abuts and indents the RIGHT colon. Post cystectomy. Stomach/Bowel: Nonvisualization of appendix. Small bowel anastomosis in RIGHT mid abdomen without bowel obstruction. Increased stool in rectum. Distended stomach. Remaining bowel loops unremarkable. Vascular/Lymphatic: Atherosclerotic calcifications aorta and iliac arteries. Plaque at origins of the celiac artery and SMA with less than 50% diameter narrowing. No adenopathy. Reproductive: N/A Other: No free air or free fluid. Musculoskeletal: Diffuse osseous demineralization. Orthopedic hardware proximal RIGHT femur. Compression fractures of the superior endplates of L1 and L3, appear old. Multilevel disc space narrowing and endplate spur formation with vacuum phenomenon. RIGHT paracentral disc protrusion L5-S1 likely exerting mass effect upon the RIGHT S1 root. RIGHT neural foraminal stenosis L3-L4 with milder degrees of neural foraminal narrowing at additional levels bilaterally.  IMPRESSION: RIGHT lower quadrant urostomy without acute abnormality. Increased stool in rectum. Nonspecific small lesions within the liver and kidneys ; if patient has any prior outside exams these would be of benefit in establishing stability. In the absence of prior exams either MR characterization or followup imaging to assess stability would be recommended. Question COPD. Aortic atherosclerosis. No definite acute intra-abdominal or intrapelvic abnormalities. Significant degenerative disc disease changes of the lumbar spine as above with compression fractures of L1 and L3 as well as RIGHT  paracentral disc herniation at L5-S1 likely exerting mass effect upon the RIGHT S1 root. Electronically Signed   By: Lavonia Dana M.D.   On: 05/29/2016 08:05   Dg Chest Port 1 View  Result Date: 06/16/2016 CLINICAL DATA:  Pain.  Hypertension EXAM: PORTABLE CHEST 1 VIEW COMPARISON:  June 13, 2016 FINDINGS: There is no edema or consolidation. Heart size and pulmonary vascularity are normal. No adenopathy. There is atherosclerotic calcification aorta. No bone lesions. IMPRESSION: Aortic atherosclerosis.  No edema or consolidation. Electronically Signed   By: Lowella Grip III M.D.   On: 06/16/2016 20:57   Dg Chest Portable 1 View  Result Date: 05/29/2016 CLINICAL DATA:  Right-sided chest pain, onset last night. EXAM: PORTABLE CHEST 1 VIEW COMPARISON:  None. FINDINGS: A single AP portable view of the chest demonstrates no focal airspace consolidation or alveolar edema. The lungs are grossly clear. There is no large effusion or pneumothorax. Cardiac and mediastinal contours appear unremarkable. IMPRESSION: No active disease. Electronically Signed   By: Andreas Newport M.D.   On: 05/29/2016 06:03   Dg Abd 2 Views  Result Date: 06/14/2016 CLINICAL DATA:  Cough, abdominal pain EXAM: ABDOMEN - 2 VIEW COMPARISON:  Abdominal and pelvic CT scan of May 29, 2016 FINDINGS: The colonic stool burden is moderate. There  are loops of mildly distended small bowel in the midline and to the left of midline. There is distention of the stomach with fluid and gas. No free extraluminal gas collections are observed. The lung bases are clear. There are numerous surgical clips in the pelvis. The bones are osteopenic and there degenerative changes of the lumbar spine with partial compressions at L1 and L4. IMPRESSION: Findings consistent with a partial mid to distal small bowel obstruction or ileus. Moderate distention of the stomach with air in fluid may benefit from the nasogastric suction. Partial compressions of the bodies of L1 and L4. The L1 compression is old. The L4 compression appears new since May 29, 2016. Electronically Signed   By: Yoon Barca  [place] M.D.   On: 06/14/2016 07:55   Ct Angio Abd/pel W/ And/or W/o  Result Date: 06/17/2016 CLINICAL DATA:  Hypotensive and tachycardia.  Worsening confusion. EXAM: CTA ABDOMEN AND PELVIS wITHOUT AND WITH CONTRAST TECHNIQUE: Multidetector CT imaging of the abdomen and pelvis was performed using the standard protocol during bolus administration of intravenous contrast. Multiplanar reconstructed images and MIPs were obtained and reviewed to evaluate the vascular anatomy. CONTRAST:  100 mL Isovue 370 intravenous COMPARISON:  06/15/2016 FINDINGS: VASCULAR Aorta: Normal caliber with moderate atherosclerotic calcification. No aneurysm. No dissection. No stenosis. Celiac: Patent without evidence of aneurysm, dissection, vasculitis or significant stenosis. SMA: Patent without evidence of aneurysm, dissection, vasculitis or significant stenosis. Renals: Both renal arteries are patent without evidence of aneurysm, dissection, vasculitis, fibromuscular dysplasia or significant stenosis. Each kidney has an accessory renal artery, also patent. IMA: Patent without evidence of aneurysm, dissection, vasculitis or significant stenosis. Inflow: Patent without evidence of aneurysm, dissection, vasculitis  or significant stenosis. Proximal Outflow: Bilateral common femoral and visualized portions of the superficial and profunda femoral arteries are patent without evidence of aneurysm, dissection, vasculitis or significant stenosis. Veins: No obvious venous abnormality. Review of the MIP images confirms the above findings. NON-VASCULAR Lower chest: No acute abnormality. Hepatobiliary: No focal liver abnormality is seen. No gallstones, gallbladder wall thickening, or biliary dilatation. Pancreas: Unremarkable. No pancreatic ductal dilatation or surrounding inflammatory changes. Spleen: Normal in size without focal abnormality. Adrenals/Urinary Tract: Both adrenals are unremarkable.  No suspicious renal parenchymal lesions. Ureters are unremarkable. Urostomy in the right lower quadrant, unremarkable. Prior cystectomy. Stomach/Bowel: Hiatal hernia. Stomach, small bowel and colon are otherwise remarkable only for large volume of stool distending the rectum, likely fecal impaction. Lymphatic: No pathologic adenopathy in the abdomen or pelvis. Reproductive: Unremarkable Other: No focal inflammatory changes are evident. No ascites. Small fat containing umbilical hernia. Musculoskeletal: Chronic unchanged compression of L1. Unchanged superior endplate impaction of L3. No significant skeletal lesions. IMPRESSION: VASCULAR Atherosclerotic calcification. No evidence of aneurysm, stenosis, dissection. NON-VASCULAR Hiatal hernia. Fecal impaction. No bowel obstruction or perforation. No evidence of bowel ischemia. Electronically Signed   By: Andreas Newport M.D.   On: 06/17/2016 03:32    Assessment:   Victor Parks is a 82 y.o. male former [employer] admitted with sob, wheeze, cough and progressed rapidly to abd pain, markedly elevated wbc, elevated lactate. Also developed profound anemia and some TCP and required 2 units PRBC. He had CT showing only fecal impaction. BCX have been neg, UCX with enterococcus and  Citrobacter and UA with 6-30 wbc. Has urostomy for many years from prior bladder cancer but this has been working wel He has had ongoing issues with abd pain for over 9 months. He had episode RUQ abd pain as well 10/31 and had WU in ED with neg CT scan. Did have evidence UTI so given ctx and then dced on bactrim. Also admitted at [facility] in March twice initially with what was felt to be SBO but CT neg and then readmitted with explosive diarrhea.   He has had 3 CT scans in last 3 weeks without evidence of abscess or source of infection. He does have chronic constipation and CT showed some fecal impaction  He has been treated with zosyn and vanco and received azithromycin as well.  Today is day 5 of IV zosyn. It is unclear what is causing his repeated episodes of abd pain and his recent episode of sepsis.  Interestingly as his Hgb dropped his BUN increased markedly suggesting possible GIB. Steroids likely also contributed to increased wbc.  Cultures negative. He may have had sepsis from translocation of bacteria across bowel wall given severe impaction. Regardless he is clinically improving   Recommendations No further imaging studies at this time.  Finish a 10 day course of abx for possible GI sepsis.  When ready for dc would change to oral augmentin but continue zosyn while inpatient Would benefit from otpt GI eval and possible colonoscopy . Thank you very much for allowing me to participate in the care of this patient. Please call with questions.   Cheral Marker. Ola Spurr, MD

## 2016-06-19 NOTE — Progress Notes (Signed)
Pharmacy Antibiotic Note  Victor Parks is a 82 y.o. male admitted on 06/13/2016 with sepsis with unknown source.   Pharmacy consulted for Zosyn dosing.    Plan: Continue Zosyn 3.375 g EI q 8 hours.   Height: 5\' 9"  (175.3 cm) Weight: 154 lb (69.9 kg) IBW/kg (Calculated) : 70.7  Temp (24hrs), Avg:97.9 F (36.6 C), Min:97.5 F (36.4 C), Max:98.2 F (36.8 C)   Recent Labs Lab 06/16/16 0210  06/16/16 1656 06/16/16 2017 06/16/16 2024 06/16/16 2348 06/17/16 0809 06/17/16 1225 06/18/16 0828  WBC 25.7*  --  45.1*  --   --  40.8* 37.4* 37.8* 28.1*  CREATININE 1.15  --   --   --  1.41* 1.22 1.13  --  0.87  LATICACIDVEN  --   < > 6.1* 5.1*  --  4.8*  --  3.1* 1.5  < > = values in this interval not displayed.  Estimated Creatinine Clearance: 56.9 mL/min (by C-G formula based on SCr of 0.87 mg/dL).    Allergies  Allergen Reactions  . Ciprofloxacin Anaphylaxis    Antimicrobials this admission: Azithromycin  11/15 >> 11/18 Zosyn 11/17 >>  Vancomycin 11/17 >>11/20  Microbiology results: 11/17 BCx: NGTD x2, x1 11/17 UCx: >100k E faecalis (amp sens), >100k citrobacter freundii  Sputum: 11/18 MRSA PCR: neg  Thank you for allowing pharmacy to be a part of this patient's care.  Loree Fee, PharmD 06/19/2016 8:14 AM

## 2016-06-19 NOTE — Progress Notes (Signed)
Nutrition Follow-up  DOCUMENTATION CODES:   Severe malnutrition in context of chronic illness  INTERVENTION:  1. Continue Boost Breeze po TID, each supplement provides 250 kcal and 9 grams of protein  NUTRITION DIAGNOSIS:   Increased nutrient needs related to wound healing as evidenced by estimated needs. -ongoing  GOAL:   Patient will meet greater than or equal to 90% of their needs -progressing  MONITOR:   PO intake, Supplement acceptance, Labs, Weight trends, I & O's, Skin  REASON FOR ASSESSMENT:   Malnutrition Screening Tool    ASSESSMENT:   82 y.o. male with a known history of Hypertension, bladder cancer, decubitus ulcer is a resident of assisted living facility. He was referred to emergency room for difficulty breathing and cough. Found to have acute bronchitis, acute kidney injury. Abdominal x-ray revealed possible partial small bowel obstruction versus ileus with moderate amount of gaseous distention of the stomach.  Pt is doing ok. Did not have any breakfast this morning.  Family states he is not much of a breakfast person. Consuming boost regularly. Reports good appetite. Family could not remember what he had for lunch/dinner yesterday. Continues to be lethargic. Meal completion documented: 100% past 2 meals.  Labs and medications reviewed.  Diet Order:  Diet Heart Room service appropriate? Yes; Fluid consistency: Thin  Skin:  Wound (see comment) (Stg II to coccyx)  Last BM:  06/19/2016  Height:   Ht Readings from Last 1 Encounters:  06/13/16 5\' 9"  (1.753 m)    Weight:   Wt Readings from Last 1 Encounters:  06/13/16 154 lb (69.9 kg)    Ideal Body Weight:  72.73 kg  BMI:  Body mass index is 22.74 kg/m.  Estimated Nutritional Needs:   Kcal:  1630-1900 (MSJ x 1.2-1.4)  Protein:  90-110 grams (1.3-1.6 grams/kg)  Fluid:  >/= 1.7 L/day (25 ml/kg)  EDUCATION NEEDS:   Education needs no appropriate at this time  Satira Anis. Nastacia Raybuck, MS, RD  LDN Inpatient Clinical Dietitian Pager [phone]

## 2016-06-19 NOTE — Care Management Important Message (Signed)
Important Message  Patient Details  Name: Victor Parks MRN: [id number] Date of Birth: 1924/01/17   Medicare Important Message Given:  Yes    Shelbie Ammons, RN 06/19/2016, 9:09 AM

## 2016-06-19 NOTE — Consult Note (Signed)
[facility]  [phone] [phone]  ID: Victor Parks OB: 11/11/1923  MR#: [id number]  OFB#:[id number]  Patient Care Team: [name], MD as PCP - General (Internal Medicine)  CHIEF COMPLAINT: Acute on chronic anemia.  INTERVAL HISTORY: Patient is an 82 year old male with multiple medical problems who lives in assisted living admitted to the hospital on June 13, 2016 with worsening cough and shortness of breath. Patient has been persistently anemic without evidence of bleeding since admission. He has required multiple units of packed red blood cells. He was found to have significantly elevated white blood cell, but this is now trending down. His platelet count is also decreased, but relatively stable. Currently, he does not complain of any further shortness of breath or cough. He has no neurologic complaints. He denies any fevers. He continues to have persistent weakness and fatigue. He denies any nausea, vomiting, consultation, or diarrhea. He has no melena or hematochezia. He has no urinary complaints. Patient otherwise feels well and offers no further specific complaints.  REVIEW OF SYSTEMS:   Review of Systems  Constitutional: Positive for malaise/fatigue. Negative for fever and weight loss.  Respiratory: Positive for cough and shortness of breath.   Cardiovascular: Positive for leg swelling. Negative for chest pain.  Gastrointestinal: Negative.  Negative for abdominal pain, blood in stool and melena.  Genitourinary: Negative.   Musculoskeletal: Negative.   Neurological: Positive for weakness.  Psychiatric/Behavioral: Negative.  The patient is not nervous/anxious.     As per HPI. Otherwise, a complete review of systems is negative.  PAST MEDICAL HISTORY: Past Medical History:  Diagnosis Date  . Bladder cancer ([place])   . Decubitus ulcer   . Hypertension   . Muscle weakness   . UTI (urinary tract infection)     PAST SURGICAL  HISTORY: Past Surgical History:  Procedure Laterality Date  . ILEAL POUCH     s/p bladder cancer  . none      FAMILY HISTORY: History reviewed. No pertinent family history.  ADVANCED DIRECTIVES (Y/N):  @ADVDIR @  HEALTH MAINTENANCE: Social History  Substance Use Topics  . Smoking status: Current Some Day Smoker  . Smokeless tobacco: Never Used  . Alcohol use Yes     Colonoscopy:  PAP:  Bone density:  Lipid panel:  Allergies  Allergen Reactions  . Ciprofloxacin Anaphylaxis    Current Facility-Administered Medications  Medication Dose Route Frequency Provider Last Rate Last Dose  . 0.9 %  sodium chloride infusion  250 mL Intravenous PRN Pavan Pyreddy, MD      . 0.9 %  sodium chloride infusion   Intravenous Continuous Wilhelmina Mcardle, MD 50 mL/hr at 06/19/16 0531    . 0.9 %  sodium chloride infusion   Intravenous Once [employer], DO      . acetaminophen (TYLENOL) tablet 650 mg  650 mg Oral Q6H PRN Saundra Shelling, MD       Or  . acetaminophen (TYLENOL) suppository 650 mg  650 mg Rectal Q6H PRN Saundra Shelling, MD      . aspirin EC tablet 81 mg  81 mg Oral Once Saundra Shelling, MD      . calcium-vitamin D (OSCAL WITH D) 500-200 MG-UNIT per tablet 2 tablet  2 tablet Oral Daily Saundra Shelling, MD   2 tablet at 06/19/16 1016  . feeding supplement (BOOST / RESOURCE BREEZE) liquid 1 Container  1 Container Oral TID BM Alexis Hugelmeyer, DO   1 Container at 06/19/16 1015  . ipratropium-albuterol (DUONEB)  0.5-2.5 (3) MG/3ML nebulizer solution 3 mL  3 mL Nebulization Q6H PRN Magadalene Abran Duke, NP      . ipratropium-albuterol (DUONEB) 0.5-2.5 (3) MG/3ML nebulizer solution 3 mL  3 mL Nebulization Q4H PRN Srikar Sudini, MD      . MEDLINE mouth rinse  15 mL Mouth Rinse BID [name], MD   15 mL at 06/19/16 1017  . mometasone-formoterol (DULERA) 100-5 MCG/ACT inhaler 2 puff  2 puff Inhalation BID [name], MD   2 puff at 06/19/16 1016  . multivitamin with minerals tablet 1 tablet   1 tablet Oral Daily Saundra Shelling, MD   1 tablet at 06/19/16 1016  . ondansetron (ZOFRAN) injection 4 mg  4 mg Intravenous Q6H PRN Saundra Shelling, MD   4 mg at 06/14/16 1221  . piperacillin-tazobactam (ZOSYN) IVPB 3.375 g  3.375 g Intravenous Q8H Napoleon Form, RPH   3.375 g at 06/19/16 1015  . potassium chloride SA (K-DUR,KLOR-CON) CR tablet 40 mEq  40 mEq Oral Q4H Srikar Sudini, MD      . potassium citrate (UROCIT-K) SR tablet 10 mEq  10 mEq Oral BID Saundra Shelling, MD   10 mEq at 06/19/16 1016  . senna-docusate (Senokot-S) tablet 1 tablet  1 tablet Oral QHS PRN Saundra Shelling, MD      . simethicone (MYLICON) chewable tablet 80 mg  80 mg Oral Q6H PRN Pavan Pyreddy, MD      . sodium chloride flush (NS) 0.9 % injection 3 mL  3 mL Intravenous Q12H Saundra Shelling, MD   3 mL at 06/18/16 2040  . sodium chloride flush (NS) 0.9 % injection 3 mL  3 mL Intravenous PRN Saundra Shelling, MD      . traMADol (ULTRAM) tablet 50 mg  50 mg Oral Q12H PRN Napoleon Form, RPH   50 mg at 06/19/16 0202  . vitamin C (ASCORBIC ACID) tablet 1,000 mg  1,000 mg Oral Daily Pavan Pyreddy, MD   1,000 mg at 06/19/16 1016    OBJECTIVE: Vitals:   06/18/16 2023 06/19/16 0402  BP: 109/71 (!) 100/48  Pulse: 82 70  Resp: 16 16  Temp: 98.2 F (36.8 C) 98.1 F (36.7 C)     Body mass index is 22.74 kg/m.    ECOG FS:2 - Symptomatic, <50% confined to bed  General: Well-developed, well-nourished, no acute distress. Eyes: Pink conjunctiva, anicteric sclera. HEENT: Normocephalic, moist mucous membranes, clear oropharnyx. Lungs: Clear to auscultation bilaterally. Heart: Regular rate and rhythm. No rubs, murmurs, or gallops. Abdomen: Soft, nontender, nondistended. No organomegaly noted, normoactive bowel sounds. Musculoskeletal: No edema, cyanosis, or clubbing. Neuro: Alert, answering all questions appropriately. Cranial nerves grossly intact. Skin: No rashes or petechiae noted. Psych: Normal affect. Lymphatics: No cervical,  calvicular, axillary or inguinal LAD.   LAB RESULTS:  Lab Results  Component Value Date   NA 138 06/19/2016   K 3.1 (L) 06/19/2016   CL 108 06/19/2016   CO2 26 06/19/2016   GLUCOSE 104 (H) 06/19/2016   BUN 20 06/19/2016   CREATININE 0.90 06/19/2016   CALCIUM 7.1 (L) 06/19/2016   PROT 4.4 (L) 06/19/2016   ALBUMIN 2.3 (L) 06/19/2016   AST 19 06/19/2016   ALT 14 (L) 06/19/2016   ALKPHOS 45 06/19/2016   BILITOT 0.6 06/19/2016   GFRNONAA >60 06/19/2016   GFRAA >60 06/19/2016    Lab Results  Component Value Date   WBC 21.0 (H) 06/19/2016   NEUTROABS 17.3 (H) 06/19/2016   HGB 7.3 (L)  06/19/2016   HCT 21.5 (L) 06/19/2016   MCV 92.2 06/19/2016   PLT 133 (L) 06/19/2016     STUDIES: Ct Abdomen Pelvis Wo Contrast  Result Date: 06/15/2016 CLINICAL DATA:  Abdominal pain.  Relieve by lying on the right side. EXAM: CT ABDOMEN AND PELVIS WITHOUT CONTRAST TECHNIQUE: Multidetector CT imaging of the abdomen and pelvis was performed following the standard protocol without IV contrast. COMPARISON:  None. FINDINGS: Lower chest: Nodular airspace disease of the left lung base. Hepatobiliary: No focal liver abnormality is seen. No gallstones, gallbladder wall thickening, or biliary dilatation. Pancreas: Unremarkable. No pancreatic ductal dilatation or surrounding inflammatory changes. Spleen: Normal in size without focal abnormality. Adrenals/Urinary Tract: Normal adrenal glands. Nonobstructing right renal calculus. Kidneys otherwise normal. No obstructive uropathy. Prior cystectomy with a right lower quadrant urostomy. Stomach/Bowel: No bowel wall thickening or bowel dilatation. No pneumatosis, pneumoperitoneum or portal venous gas. Rectal fecal impaction. Small hiatal hernia. Vascular/Lymphatic: Normal caliber abdominal aorta with atherosclerosis. No lymphadenopathy. Reproductive: Prior prostatectomy with surgical clips along the pelvic sidewalls. Other: No fluid collection or hematoma.  No abdominal  wall hernia. Musculoskeletal: Chronic L1 vertebral body compression fracture. Mild chronic L3 vertebral body compression fracture. Degenerative disc disease with disc height loss at L3-4, L4-5 and L5-S1 with bilateral facet arthropathy. ORIF baby right intertrochanteric fracture. IMPRESSION: 1. No acute abdominal or pelvic pathology. 2. Prior cystectomy with in right lower quadrant urostomy. 3. Nonobstructing right renal calculus. Electronically Signed   By: Kathreen Devoid   On: 06/15/2016 16:39   Dg Chest 1 View  Result Date: 06/13/2016 CLINICAL DATA:  Acute onset dyspnea tonight. EXAM: CHEST 1 VIEW COMPARISON:  05/29/2016 FINDINGS: There is stable mild left hemidiaphragm elevation and minimal linear basilar scarring or atelectasis. No confluent airspace consolidation. No effusions. Normal pulmonary vasculature. Hilar, mediastinal and cardiac contours are unremarkable and unchanged. IMPRESSION: No active disease. Electronically Signed   By: Andreas Newport M.D.   On: 06/13/2016 03:14   Dg Abd 1 View  Result Date: 06/16/2016 CLINICAL DATA:  Pain, inability to give further history EXAM: ABDOMEN - 1 VIEW COMPARISON:  CT abdomen and pelvis performed 06/15/2016. FINDINGS: No bowel obstruction is evident. Enteric contrast fills the colon. Within limits for assessment on portable supine radiograph, no visible free air. Prior cystectomy for bladder cancer with RIGHT lower quadrant urostomy. Osteopenia. RIGHT femoral neck ORIF prosthesis, stable. IMPRESSION: No acute findings. Electronically Signed   By: Staci Righter M.D.   On: 06/16/2016 20:55   Ct Abdomen Pelvis W Contrast  Result Date: 05/29/2016 CLINICAL DATA:  RIGHT chest, RIGHT upper quadrant, epigastric abdominal pain/ discomfort beginning last night, history of bladder cancer post cystectomy and urostomy, leukocytosis, anemia EXAM: CT ABDOMEN AND PELVIS WITH CONTRAST TECHNIQUE: Multidetector CT imaging of the abdomen and pelvis was performed using  the standard protocol following bolus administration of intravenous contrast. Sagittal and coronal MPR images reconstructed from axial data set. CONTRAST:  24m ISOVUE-300 IOPAMIDOL (ISOVUE-300) INJECTION 61% IV. Dilute oral contrast. COMPARISON:  None FINDINGS: Lower chest: Bibasilar emphysematous changes Hepatobiliary: Contracted gallbladder. Nonspecific 5 mm low-attenuation lesion RIGHT lobe liver image 22. Remainder of liver unremarkable. Pancreas: Normal appearance Spleen: Normal appearance Adrenals/Urinary Tract: Small BILATERAL intermediate attenuation renal lesions as well as a small probable RIGHT renal cyst. Unremarkable adrenal glands. Fullness of BILATERAL renal collecting systems and ureters extending to RIGHT lower quadrant urostomy which abuts and indents the RIGHT colon. Post cystectomy. Stomach/Bowel: Nonvisualization of appendix. Small bowel anastomosis in RIGHT mid abdomen without  bowel obstruction. Increased stool in rectum. Distended stomach. Remaining bowel loops unremarkable. Vascular/Lymphatic: Atherosclerotic calcifications aorta and iliac arteries. Plaque at origins of the celiac artery and SMA with less than 50% diameter narrowing. No adenopathy. Reproductive: N/A Other: No free air or free fluid. Musculoskeletal: Diffuse osseous demineralization. Orthopedic hardware proximal RIGHT femur. Compression fractures of the superior endplates of L1 and L3, appear old. Multilevel disc space narrowing and endplate spur formation with vacuum phenomenon. RIGHT paracentral disc protrusion L5-S1 likely exerting mass effect upon the RIGHT S1 root. RIGHT neural foraminal stenosis L3-L4 with milder degrees of neural foraminal narrowing at additional levels bilaterally. IMPRESSION: RIGHT lower quadrant urostomy without acute abnormality. Increased stool in rectum. Nonspecific small lesions within the liver and kidneys ; if patient has any prior outside exams these would be of benefit in establishing  stability. In the absence of prior exams either MR characterization or followup imaging to assess stability would be recommended. Question COPD. Aortic atherosclerosis. No definite acute intra-abdominal or intrapelvic abnormalities. Significant degenerative disc disease changes of the lumbar spine as above with compression fractures of L1 and L3 as well as RIGHT paracentral disc herniation at L5-S1 likely exerting mass effect upon the RIGHT S1 root. Electronically Signed   By: Lavonia Dana M.D.   On: 05/29/2016 08:05   Dg Chest Port 1 View  Result Date: 06/16/2016 CLINICAL DATA:  Pain.  Hypertension EXAM: PORTABLE CHEST 1 VIEW COMPARISON:  June 13, 2016 FINDINGS: There is no edema or consolidation. Heart size and pulmonary vascularity are normal. No adenopathy. There is atherosclerotic calcification aorta. No bone lesions. IMPRESSION: Aortic atherosclerosis.  No edema or consolidation. Electronically Signed   By: Lowella Grip III M.D.   On: 06/16/2016 20:57   Dg Chest Portable 1 View  Result Date: 05/29/2016 CLINICAL DATA:  Right-sided chest pain, onset last night. EXAM: PORTABLE CHEST 1 VIEW COMPARISON:  None. FINDINGS: A single AP portable view of the chest demonstrates no focal airspace consolidation or alveolar edema. The lungs are grossly clear. There is no large effusion or pneumothorax. Cardiac and mediastinal contours appear unremarkable. IMPRESSION: No active disease. Electronically Signed   By: Andreas Newport M.D.   On: 05/29/2016 06:03   Dg Abd 2 Views  Result Date: 06/14/2016 CLINICAL DATA:  Cough, abdominal pain EXAM: ABDOMEN - 2 VIEW COMPARISON:  Abdominal and pelvic CT scan of May 29, 2016 FINDINGS: The colonic stool burden is moderate. There are loops of mildly distended small bowel in the midline and to the left of midline. There is distention of the stomach with fluid and gas. No free extraluminal gas collections are observed. The lung bases are clear. There are  numerous surgical clips in the pelvis. The bones are osteopenic and there degenerative changes of the lumbar spine with partial compressions at L1 and L4. IMPRESSION: Findings consistent with a partial mid to distal small bowel obstruction or ileus. Moderate distention of the stomach with air in fluid may benefit from the nasogastric suction. Partial compressions of the bodies of L1 and L4. The L1 compression is old. The L4 compression appears new since May 29, 2016. Electronically Signed   By: David  Martinique M.D.   On: 06/14/2016 07:55   Ct Angio Abd/pel W/ And/or W/o  Result Date: 06/17/2016 CLINICAL DATA:  Hypotensive and tachycardia.  Worsening confusion. EXAM: CTA ABDOMEN AND PELVIS wITHOUT AND WITH CONTRAST TECHNIQUE: Multidetector CT imaging of the abdomen and pelvis was performed using the standard protocol during bolus administration of intravenous  contrast. Multiplanar reconstructed images and MIPs were obtained and reviewed to evaluate the vascular anatomy. CONTRAST:  100 mL Isovue 370 intravenous COMPARISON:  06/15/2016 FINDINGS: VASCULAR Aorta: Normal caliber with moderate atherosclerotic calcification. No aneurysm. No dissection. No stenosis. Celiac: Patent without evidence of aneurysm, dissection, vasculitis or significant stenosis. SMA: Patent without evidence of aneurysm, dissection, vasculitis or significant stenosis. Renals: Both renal arteries are patent without evidence of aneurysm, dissection, vasculitis, fibromuscular dysplasia or significant stenosis. Each kidney has an accessory renal artery, also patent. IMA: Patent without evidence of aneurysm, dissection, vasculitis or significant stenosis. Inflow: Patent without evidence of aneurysm, dissection, vasculitis or significant stenosis. Proximal Outflow: Bilateral common femoral and visualized portions of the superficial and profunda femoral arteries are patent without evidence of aneurysm, dissection, vasculitis or significant  stenosis. Veins: No obvious venous abnormality. Review of the MIP images confirms the above findings. NON-VASCULAR Lower chest: No acute abnormality. Hepatobiliary: No focal liver abnormality is seen. No gallstones, gallbladder wall thickening, or biliary dilatation. Pancreas: Unremarkable. No pancreatic ductal dilatation or surrounding inflammatory changes. Spleen: Normal in size without focal abnormality. Adrenals/Urinary Tract: Both adrenals are unremarkable. No suspicious renal parenchymal lesions. Ureters are unremarkable. Urostomy in the right lower quadrant, unremarkable. Prior cystectomy. Stomach/Bowel: Hiatal hernia. Stomach, small bowel and colon are otherwise remarkable only for large volume of stool distending the rectum, likely fecal impaction. Lymphatic: No pathologic adenopathy in the abdomen or pelvis. Reproductive: Unremarkable Other: No focal inflammatory changes are evident. No ascites. Small fat containing umbilical hernia. Musculoskeletal: Chronic unchanged compression of L1. Unchanged superior endplate impaction of L3. No significant skeletal lesions. IMPRESSION: VASCULAR Atherosclerotic calcification. No evidence of aneurysm, stenosis, dissection. NON-VASCULAR Hiatal hernia. Fecal impaction. No bowel obstruction or perforation. No evidence of bowel ischemia. Electronically Signed   By: Andreas Newport M.D.   On: 06/17/2016 03:32    ASSESSMENT: Acute on chronic anemia.  PLAN:    1. Acute on chronic anemia: Unclear etiology but given patient's thrombocytopenia, he may have a component of underlying MDS. Iron stores, B-12, folate are within normal limits. He has a negative direct antibody and only a mildly elevated LDH. Haptoglobin is pending. Unless haptoglobin is less than 10, would not suspect underlying hemolysis.  If patient has underlying MDS, this would take a bone marrow biopsy to confirm. This is not necessary at this point. Patient and his family have already indicated that he  would not want to undergo this procedure. Given MDS, patient will have very little reserve in the setting of an acute illness. Recommend continue blood transfusions maintaining hemoglobin above 7.0. Patient should follow-up in the [facility] 1-2 weeks after discharge for further evaluation. 2. Leukocytosis: Likely reactive. Peripheral blood flow cytometry has been ordered for completeness. No bone marrow biopsy as above. 3. Thrombocytopenia: Mild, monitor.  Appreciate consult, will follow.   Lloyd Huger, MD   06/19/2016 1:06 PM

## 2016-06-19 NOTE — Progress Notes (Signed)
PT Cancellation Note  Patient Details Name: Victor Parks MRN: [id number] DOB: [birth year]   Cancelled Treatment:    Reason Eval/Treat Not Completed: Other (comment).  Pt pending R UE venous US secondary to arm swelling.  Will hold PT at this time and re-attempt PT re-eval once results are known and pt is medically appropriate to participate in PT.   Raquel Sarna Tarren Sabree 06/19/2016, 3:01 PM Leitha Bleak, [place]

## 2016-06-19 NOTE — Progress Notes (Signed)
Patient ID: Victor Parks, male   DOB: [birth year], 82 y.o.   MRN: [id number]   [facility] NAME: Victor Parks    MR#:  [id number]  DATE OF BIRTH:  1923/09/21  SUBJECTIVE:  CHIEF COMPLAINT:   Chief Complaint  Patient presents with  . Cough   Transferred to ICU due to drop in hemoglobin and elevated lactic acid.  Today patient seems comfortable without any pain. Blood pressure normal. Received 2 units packed RBC this admission.  Had multiple bowel movements after an enema overnight without any blood or black stools  REVIEW OF SYSTEMS:  ROS CONSTITUTIONAL: No fever, fatigue or weakness.  EYES: No blurred or double vision.  EARS, NOSE, AND THROAT: No tinnitus or ear pain.  RESPIRATORY: Negative cough denies shortness of breath, wheezing or hemoptysis.  CARDIOVASCULAR: No chest pain, orthopnea, edema.  GASTROINTESTINAL: No nausea, vomiting, diarrhea.No abdominal pain. Positive constipation. GENITOURINARY: No dysuria, hematuria.  ENDOCRINE: No polyuria, nocturia,  HEMATOLOGY: No anemia, easy bruising or bleeding SKIN: No rash or lesion. MUSCULOSKELETAL: No joint pain or arthritis.  NEUROLOGIC: No tingling, numbness, weakness.  PSYCHIATRY: No anxiety or depression.  DRUG ALLERGIES:   Allergies  Allergen Reactions  . Ciprofloxacin Anaphylaxis   VITALS:  Blood pressure (!) 100/48, pulse 70, temperature 98.1 F (36.7 C), resp. rate 16, height 5\' 9"  (1.753 m), weight 69.9 kg (154 lb), SpO2 100 %. PHYSICAL EXAMINATION:  Physical Exam  Physical Exam  GENERAL: 82 y.o.-year-old patient lying in the bed with mild acute distress.  EYES: Pupils equal, round, reactive to light and accommodation. No scleral icterus. Extraocular muscles intact.  HEENT: Head atraumatic, normocephalic. Oropharynx and nasopharynx clear.  NECK: Supple, no jugular venous distention. No thyroid enlargement, no tenderness.  LUNGS: Normal breath  sounds bilaterally, no wheezing, rales,rhonchi or crepitation. No use of accessory muscles of respiration.  CARDIOVASCULAR: S1, S2 normal. No murmurs, rubs, or gallops.  ABDOMEN: Soft, nontender, nondistended. Bowel sounds present. No organomegaly or mass.  EXTREMITIES: No pedal edema, cyanosis, or clubbing.  NEUROLOGIC: Cranial nerves II through XII are intact. Muscle strength 5/5 in all extremities. Sensation intact. Gait not checked.  PSYCHIATRIC: The patient is awake. Oriented to place but not time  Bruising and edema of right upper extremity. LABORATORY PANEL:   CBC  Recent Labs Lab 06/19/16 0946  WBC 21.0*  HGB 7.3*  HCT 21.5*  PLT 133*   ------------------------------------------------------------------------------------------------------------------ Chemistries   Recent Labs Lab 06/16/16 2024  06/18/16 0828  NA 136  < > 139  K 4.1  < > 3.0*  CL 108  < > 113*  CO2 19*  < > 23  GLUCOSE 177*  < > 96  BUN 99*  < > 33*  CREATININE 1.41*  < > 0.87  CALCIUM 8.0*  < > 7.1*  MG 2.2  --   --   AST  --   < > 24  ALT  --   < > 15*  ALKPHOS  --   < > 37*  BILITOT  --   < > 0.7  < > = values in this interval not displayed. RADIOLOGY:  No results found. ASSESSMENT AND PLAN:   82 year old elderly male patient with history of bladder cancer, hypertension, decubitus ulcer, UTI Admitted on 06/13/2016 for bronchitis and AKI.  1. Abdominal pain, intermittent- Resolved  due to constipation. CT scan of the abdomen and pelvis showed nothing acute.  2. Sepsis unclear etiology Thought to be  intra-abdominal source. CT scan of the abdomen and pelvis normal. On broad-spectrum antibiotics. Cultures negative. WBC slowly trending down. Infectious disease consulted. Cultures no growth to date.  3. Anemia, significant decrease in hemoglobin and hematocrit 2 units blood transfused. Still anemic. No blood in stool noticed. oncology consult for hemolytic cause. He also has  associated significant leukocytosis with no clear source of infection and thrombocytopenia.  5. Acute kidney injury - resolved IV fluids stop today  6..Decubitus ulcer  7.Hypertension-stable. Continue close monitoring  8. Acute encephalopathy likely due to acute illness.  9. RUE swelling Check venous dopplers  All the records are reviewed and case discussed with Care Management/Social Worker. Management plans discussed with the patient, family and they are in agreement.  CODE STATUS: DO NOT RESUSCITATE  TOTAL TIME TAKING CARE OF THIS PATIENT: 33minutes.   POSSIBLE D/C IN 1-2DAYS, DEPENDING ON CLINICAL CONDITION   [name] R M.D. on 06/19/2016 at 10:23 AM  Between 7am to 6pm - Pager - [phone]  After 6pm go to [email or web address] - Proofreader  [facility]  Office  [phone]  CC: Primary care physician; [name], MD  Note: This dictation was prepared with Dragon dictation along with smaller phrase technology. Any transcriptional errors that result from this process are unintentional.

## 2016-06-19 NOTE — Progress Notes (Signed)
[name], MD [facility]   [address], [place], [place] [address] Phone: [phone] Fax : [phone]   Subjective: The patient has anemia with a large melenotic stool this am. Denies any abd pain, nausea or vomiting.   Objective: Vital signs in last 24 hours: Vitals:   06/18/16 1231 06/18/16 2023 06/19/16 0402 06/19/16 1342  BP: (!) 110/49 109/71 (!) 100/48 (!) 90/44  Pulse: 67 82 70 78  Resp: 16 16 16 20   Temp: 97.5 F (36.4 C) 98.2 F (36.8 C) 98.1 F (36.7 C) 97.6 F (36.4 C)  TempSrc: Oral Oral  Oral  SpO2: 100% 99% 100% 99%  Weight:      Height:       Weight change:   Intake/Output Summary (Last 24 hours) at 06/19/16 1623 Last data filed at 06/19/16 1500  Gross per 24 hour  Intake          6522.16 ml  Output             1850 ml  Net          4672.16 ml     Exam: Heart:: Regular rate and rhythm Lungs: normal Abdomen: soft, nontender, normal bowel sounds   Lab Results: @LABTEST2 @ Micro Results: Recent Results (from the past 240 hour(s))  Culture, blood (single) w Reflex to ID Panel     Status: None   Collection Time: 06/14/16 12:59 PM  Result Value Ref Range Status   Specimen Description BLOOD RIGHT ARM  Final   Special Requests BOTTLES DRAWN AEROBIC AND ANAEROBIC [place]  Final   Culture NO GROWTH 5 DAYS  Final   Report Status 06/19/2016 FINAL  Final  Urine culture     Status: Abnormal   Collection Time: 06/15/16  3:25 PM  Result Value Ref Range Status   Specimen Description URINE, RANDOM  Final   Special Requests Normal  Final   Culture (A)  Final    >=100,000 COLONIES/mL ENTEROCOCCUS FAECALIS >=100,000 COLONIES/mL CITROBACTER FREUNDII    Report Status 06/18/2016 FINAL  Final   Organism ID, Bacteria ENTEROCOCCUS FAECALIS (A)  Final   Organism ID, Bacteria CITROBACTER FREUNDII (A)  Final      Susceptibility   Citrobacter freundii - MIC*    CEFAZOLIN >=64 RESISTANT Resistant     CEFTRIAXONE <=1 SENSITIVE Sensitive    CIPROFLOXACIN <=0.25 SENSITIVE Sensitive     GENTAMICIN <=1 SENSITIVE Sensitive     IMIPENEM <=0.25 SENSITIVE Sensitive     NITROFURANTOIN <=16 SENSITIVE Sensitive     TRIMETH/SULFA >=320 RESISTANT Resistant     PIP/TAZO <=4 SENSITIVE Sensitive     * >=100,000 COLONIES/mL CITROBACTER FREUNDII   Enterococcus faecalis - MIC*    AMPICILLIN <=2 SENSITIVE Sensitive     LEVOFLOXACIN >=8 RESISTANT Resistant     NITROFURANTOIN <=16 SENSITIVE Sensitive     VANCOMYCIN 1 SENSITIVE Sensitive     * >=100,000 COLONIES/mL ENTEROCOCCUS FAECALIS  CULTURE, BLOOD (ROUTINE X 2) w Reflex to ID Panel     Status: None (Preliminary result)   Collection Time: 06/15/16  3:35 PM  Result Value Ref Range Status   Specimen Description BLOOD LEFT HAND  Final   Special Requests   Final    BOTTLES DRAWN AEROBIC AND ANAEROBIC  AER 12CC ANA [place]   Culture NO GROWTH 4 DAYS  Final   Report Status PENDING  Incomplete  CULTURE, BLOOD (ROUTINE X 2) w Reflex to ID Panel     Status: None (Preliminary result)  Collection Time: 06/15/16  3:35 PM  Result Value Ref Range Status   Specimen Description BLOOD RIGHT HAND  Final   Special Requests   Final    BOTTLES DRAWN AEROBIC AND ANAEROBIC  AER [place] ANA 6CC   Culture NO GROWTH 4 DAYS  Final   Report Status PENDING  Incomplete  MRSA PCR Screening     Status: None   Collection Time: 06/16/16 10:35 PM  Result Value Ref Range Status   MRSA by PCR NEGATIVE NEGATIVE Final    Comment:        The GeneXpert MRSA Assay (FDA approved for NASAL specimens only), is one component of a comprehensive MRSA colonization surveillance program. It is not intended to diagnose MRSA infection nor to guide or monitor treatment for MRSA infections.    Studies/Results: No results found. Medications: I have reviewed the patient's current medications. Scheduled Meds: . sodium chloride   Intravenous Once  . aspirin EC  81 mg Oral Once  . calcium-vitamin D  2 tablet Oral Daily  . feeding  supplement  1 Container Oral TID BM  . mouth rinse  15 mL Mouth Rinse BID  . mometasone-formoterol  2 puff Inhalation BID  . multivitamin with minerals  1 tablet Oral Daily  . piperacillin-tazobactam (ZOSYN)  IV  3.375 g Intravenous Q8H  . potassium chloride  40 mEq Oral Q4H  . potassium citrate  10 mEq Oral BID  . sodium chloride flush  3 mL Intravenous Q12H  . vitamin C  1,000 mg Oral Daily   Continuous Infusions: . sodium chloride 50 mL/hr at 06/19/16 0531   PRN Meds:.sodium chloride, acetaminophen **OR** acetaminophen, ipratropium-albuterol, ipratropium-albuterol, [DISCONTINUED] ondansetron **OR** ondansetron (ZOFRAN) IV, senna-docusate, simethicone, sodium chloride flush, traMADol   Assessment: Principal Problem:   Dyspnea Active Problems:   Pressure injury of skin   Protein-calorie malnutrition, severe   Abdominal pain   Septic shock (HCC)   Severe sepsis with septic shock (HCC)   Acute blood loss anemia    Plan: [facility] - EGD for tomorrow with Dr. Vicente Males. NPO after midnight. Clear liquid diet today.   LOS: 4 days   [name] 06/19/2016, 4:23 PM

## 2016-06-20 ENCOUNTER — Encounter
Admission: EM | Disposition: A | Discharge status: Skilled Nursing Facility | Payer: Self-pay | Source: Home / Self Care | Attending: Internal Medicine

## 2016-06-20 ENCOUNTER — Inpatient Hospital Stay: Payer: Medicare Other | Admitting: Anesthesiology

## 2016-06-20 ENCOUNTER — Encounter: Payer: Self-pay | Admitting: Anesthesiology

## 2016-06-20 HISTORY — PX: ESOPHAGOGASTRODUODENOSCOPY (EGD) WITH PROPOFOL: SHX5813

## 2016-06-20 LAB — CBC WITH DIFFERENTIAL/PLATELET
BASOS PCT: 0 %
Band Neutrophils: 2 %
Basophils Absolute: 0 10*3/uL (ref 0–0.1)
Blasts: 0 %
EOS PCT: 1 %
Eosinophils Absolute: 0.2 10*3/uL (ref 0–0.7)
HCT: 23.2 % — ABNORMAL LOW (ref 40.0–52.0)
Hemoglobin: 7.8 g/dL — ABNORMAL LOW (ref 13.0–18.0)
LYMPHS ABS: 3.9 10*3/uL — AB (ref 1.0–3.6)
Lymphocytes Relative: 21 %
MCH: 31.3 pg (ref 26.0–34.0)
MCHC: 33.5 g/dL (ref 32.0–36.0)
MCV: 93.5 fL (ref 80.0–100.0)
METAMYELOCYTES PCT: 3 %
MONO ABS: 0.6 10*3/uL (ref 0.2–1.0)
MONOS PCT: 3 %
Myelocytes: 0 %
NEUTROS ABS: 13.8 10*3/uL — AB (ref 1.4–6.5)
NRBC: 0 /100{WBCs}
Neutrophils Relative %: 70 %
Other: 0 %
PLATELETS: 151 10*3/uL (ref 150–440)
Promyelocytes Absolute: 0 %
RBC: 2.48 MIL/uL — AB (ref 4.40–5.90)
RDW: 15.5 % — AB (ref 11.5–14.5)
WBC: 18.5 10*3/uL — AB (ref 3.8–10.6)

## 2016-06-20 LAB — BASIC METABOLIC PANEL
Anion gap: 5 (ref 5–15)
BUN: 14 mg/dL (ref 6–20)
CHLORIDE: 105 mmol/L (ref 101–111)
CO2: 27 mmol/L (ref 22–32)
CREATININE: 0.84 mg/dL (ref 0.61–1.24)
Calcium: 7.6 mg/dL — ABNORMAL LOW (ref 8.9–10.3)
GFR calc Af Amer: >60 mL/min (ref >60)
GFR calc non Af Amer: >60 mL/min (ref >60)
Glucose, Bld: 110 mg/dL — ABNORMAL HIGH (ref 65–99)
POTASSIUM: 4 mmol/L (ref 3.5–5.1)
SODIUM: 137 mmol/L (ref 135–145)

## 2016-06-20 LAB — CULTURE, BLOOD (ROUTINE X 2)
CULTURE: NO GROWTH
Culture: NO GROWTH

## 2016-06-20 LAB — HAPTOGLOBIN: HAPTOGLOBIN: 152 mg/dL (ref 34–200)

## 2016-06-20 SURGERY — ESOPHAGOGASTRODUODENOSCOPY (EGD) WITH PROPOFOL
Anesthesia: General

## 2016-06-20 MED ORDER — FENTANYL CITRATE (PF) 100 MCG/2ML IJ SOLN
INTRAMUSCULAR | Status: DC | PRN
  Administered 2016-06-20: 25 ug via INTRAVENOUS

## 2016-06-20 MED ORDER — LIDOCAINE HCL (CARDIAC) 20 MG/ML IV SOLN
INTRAVENOUS | Status: DC | PRN
  Administered 2016-06-20: 60 mg via INTRAVENOUS

## 2016-06-20 MED ORDER — PHENYLEPHRINE HCL 10 MG/ML IJ SOLN
INTRAMUSCULAR | Status: DC | PRN
  Administered 2016-06-20: 100 ug via INTRAVENOUS
  Administered 2016-06-20: 200 ug via INTRAVENOUS

## 2016-06-20 MED ORDER — GLYCOPYRROLATE 0.2 MG/ML IJ SOLN
INTRAMUSCULAR | Status: DC | PRN
  Administered 2016-06-20: 0.2 mg via INTRAVENOUS

## 2016-06-20 MED ORDER — PROPOFOL 500 MG/50ML IV EMUL
INTRAVENOUS | Status: DC | PRN
  Administered 2016-06-20: 55 ug/kg/min via INTRAVENOUS

## 2016-06-20 MED ORDER — PROPOFOL 10 MG/ML IV BOLUS
INTRAVENOUS | Status: DC | PRN
  Administered 2016-06-20 (×2): 30 mg via INTRAVENOUS

## 2016-06-20 MED ORDER — SUCRALFATE 1 G PO TABS
1.0000 g | ORAL_TABLET | Freq: Three times a day (TID) | ORAL | Status: DC
  Administered 2016-06-20 – 2016-06-22 (×7): 1 g via ORAL
  Filled 2016-06-20 (×6): qty 1

## 2016-06-20 MED ORDER — PANTOPRAZOLE SODIUM 40 MG PO TBEC
40.0000 mg | DELAYED_RELEASE_TABLET | Freq: Every day | ORAL | Status: DC
  Administered 2016-06-20 – 2016-06-22 (×3): 40 mg via ORAL
  Filled 2016-06-20 (×6): qty 1

## 2016-06-20 NOTE — Clinical Social Work Placement (Signed)
   CLINICAL SOCIAL WORK PLACEMENT  NOTE  Date:  06/20/2016  Patient Details  Name: Victor Parks MRN: [id number] Date of Birth: 11/21/1923  Clinical Social Work is seeking post-discharge placement for this patient at the [place] level of care (*CSW will initial, date and re-position this form in  chart as items are completed):  Yes   Patient/family provided with [facility] Work Department's list of facilities offering this level of care within the geographic area requested by the patient (or if unable, by the patient's family).  Yes   Patient/family informed of their freedom to choose among providers that offer the needed level of care, that participate in Medicare, Medicaid or managed care program needed by the patient, have an available bed and are willing to accept the patient.  Yes   Patient/family informed of [facility]'s ownership interest in [facility] and [facility], as well as of the fact that they are under no obligation to receive care at these facilities.  PASRR submitted to EDS on 06/20/16     PASRR number received on 06/20/16     Existing PASRR number confirmed on       FL2 transmitted to all facilities in geographic area requested by pt/family on 06/20/16     FL2 transmitted to all facilities within larger geographic area on       Patient informed that his/her managed care company has contracts with or will negotiate with certain facilities, including the following:            Patient/family informed of bed offers received.  Patient chooses bed at       Physician recommends and patient chooses bed at      Patient to be transferred to   on  .  Patient to be transferred to facility by       Patient family notified on   of transfer.  Name of family member notified:        PHYSICIAN Please sign FL2, Please sign DNR     Additional Comment:    _______________________________________________ Ross Ludwig 06/20/2016, 5:32 PM

## 2016-06-20 NOTE — Progress Notes (Signed)
PT Cancellation Note  Patient Details Name: Victor Parks MRN: [id number] DOB: 03-20-1924   Cancelled Treatment:    Reason Eval/Treat Not Completed: Patient declined, no reason specified.  Attempted to see pt for PT re-eval (new PT orders received s/p transfer to CCU) but pt adamantly refusing d/t being tired and wanting to sleep.  Pt's family present (wife sleeping in chair, daughter and son-in-law present) and pt's son-in-law attempted to encourage pt to participate but pt still refusing.  Pt s/p EGD this morning.  Will re-attempt PT re-eval at a later date/time.   Leitha Bleak 06/20/2016, 4:14 PM Leitha Bleak, [place]

## 2016-06-20 NOTE — Anesthesia Postprocedure Evaluation (Signed)
Anesthesia Post Note  Patient: Victor Parks  Procedure(s) Performed: Procedure(s) (LRB): ESOPHAGOGASTRODUODENOSCOPY (EGD) WITH PROPOFOL (N/A)  Patient location during evaluation: Endoscopy Anesthesia Type: General Level of consciousness: awake and alert Pain management: pain level controlled Vital Signs Assessment: post-procedure vital signs reviewed and stable Respiratory status: spontaneous breathing, nonlabored ventilation, respiratory function stable and patient connected to nasal cannula oxygen Cardiovascular status: blood pressure returned to baseline and stable Postop Assessment: no signs of nausea or vomiting Anesthetic complications: no    Last Vitals:  Vitals:   06/20/16 1221 06/20/16 1231  BP: (!) 105/56 (!) 98/55  Pulse: 87 82  Resp: 19 15  Temp:      Last Pain:  Vitals:   06/20/16 1201  TempSrc: Axillary  PainSc:                  Kerline Trahan S

## 2016-06-20 NOTE — Progress Notes (Signed)
PT Cancellation Note  Pt currently off floor for procedure and not available for PT session.  Will re-attempt PT re-evaluation at a later date/time.  Leitha Bleak, [place]

## 2016-06-20 NOTE — H&P (Signed)
Jonathon Bellows MD [address] [place], [place] [address] Phone: [phone] Fax : [phone]  Primary Care Physician:  [name], MD Primary Gastroenterologist:  Dr. Jonathon Bellows   Pre-Procedure History & Physical: HPI:  Victor Parks is a 82 y.o. male is here for an endoscopy.   Past Medical History:  Diagnosis Date  . Bladder cancer ([place])   . Decubitus ulcer   . Hypertension   . Muscle weakness   . UTI (urinary tract infection)     Past Surgical History:  Procedure Laterality Date  . ILEAL POUCH     s/p bladder cancer  . none      Prior to Admission medications   Medication Sig Start Date End Date Taking? Authorizing Provider  acetaminophen (TYLENOL) 325 MG tablet Take 650 mg by mouth every 6 (six) hours as needed.   Yes Historical Provider, MD  Ascorbic Acid (VITAMIN C) 1000 MG tablet Take 1,000 mg by mouth daily.   Yes Historical Provider, MD  aspirin EC 81 MG tablet Take 81 mg by mouth once. tuesday   Yes Historical Provider, MD  Calcium Carb-Cholecalciferol (CALCIUM 1000 + D) 1000-800 MG-UNIT TABS Take 1 tablet by mouth daily.   Yes Historical Provider, MD  cholestyramine light (PREVALITE) 4 g packet Take 4 g by mouth daily.   Yes Historical Provider, MD  Fluticasone-Salmeterol (ADVAIR) 250-50 MCG/DOSE AEPB Inhale 1 puff into the lungs 2 (two) times daily.   Yes Historical Provider, MD  lactose free nutrition (BOOST) LIQD Take 1 Container by mouth daily.   Yes Historical Provider, MD  loperamide (IMODIUM A-D) 2 MG tablet Take 2 mg by mouth every 6 (six) hours as needed for diarrhea or loose stools.   Yes Historical Provider, MD  Menthol-Zinc Oxide (CALMOSEPTINE) 0.44-20.6 % OINT Apply 1 application topically every 12 (twelve) hours as needed. To buttocks for rash   Yes Historical Provider, MD  Multiple Vitamin (DAILY VITE) TABS Take 1 tablet by mouth daily.   Yes Historical Provider, MD  potassium citrate (UROCIT-K) 10 MEQ (1080 MG) SR tablet Take 10  mEq by mouth 2 (two) times daily.   Yes Historical Provider, MD  simethicone (MYLICON) 80 MG chewable tablet Chew 80 mg by mouth every 6 (six) hours as needed for flatulence.   Yes Historical Provider, MD  traMADol (ULTRAM) 50 MG tablet Take 50 mg by mouth every 12 (twelve) hours as needed.   Yes Historical Provider, MD  vitamin B-12 (CYANOCOBALAMIN) 1000 MCG tablet Take 1,000 mcg by mouth daily.   Yes Historical Provider, MD    Allergies as of 06/13/2016 - Review Complete 06/13/2016  Allergen Reaction Noted  . Ciprofloxacin Anaphylaxis 05/29/2016    History reviewed. No pertinent family history.  Social History   Social History  . Marital status: Married    Spouse name: N/A  . Number of children: N/A  . Years of education: N/A   Occupational History  . retired    Social History Main Topics  . Smoking status: Current Some Day Smoker  . Smokeless tobacco: Never Used  . Alcohol use Yes  . Drug use: No  . Sexual activity: Not on file   Other Topics Concern  . Not on file   Social History Narrative  . No narrative on file    Review of Systems: See HPI, otherwise negative ROS  Physical Exam: BP (!) 89/43   Pulse 67   Temp 98 F (36.7 C) (Oral)   Resp 16   Ht  5\' 6"  (1.676 m)   Wt 202 lb (91.6 kg)   SpO2 95%   BMI 32.60 kg/m  General:   Alert,  pleasant and cooperative in NAD Head:  Normocephalic and atraumatic. Neck:  Supple; no masses or thyromegaly. Lungs:  Clear throughout to auscultation.    Heart:  Regular rate and rhythm. Abdomen:  Soft, nontender and nondistended. Normal bowel sounds, without guarding, and without rebound.  Ileal conduit bag in right side of abdomen with clear urine Neurologic:  Alert and  oriented x4;  grossly normal neurologically.  Impression/Plan: [name] is here for an endoscopy to be performed for an upper GI bleed  Risks, benefits, limitations, and alternatives regarding  endoscopy have been reviewed with the patient.   Questions have been answered.  All parties agreeable.   Jonathon Bellows, MD  06/20/2016, 11:31 AM

## 2016-06-20 NOTE — OR Nursing (Signed)
REPORT TO DANIELLE ON 1C. PT TO RESUME CLEAR LIQUID DIET. START CARAFATE 1GM QID, AND PRILOSEC 40MG  PO QD. PT TO BE SENT BACK TO FLOOR WITH ORDERLY

## 2016-06-20 NOTE — Anesthesia Preprocedure Evaluation (Signed)
Anesthesia Evaluation  Patient identified by MRN, date of birth, ID band Patient awake    Reviewed: Allergy & Precautions, NPO status , Patient's Chart, lab work & pertinent test results, reviewed documented beta blocker date and time   Airway Mallampati: III  TM Distance: >3 FB     Dental  (+) Chipped   Pulmonary shortness of breath, Current Smoker,           Cardiovascular hypertension, Pt. on medications      Neuro/Psych    GI/Hepatic   Endo/Other    Renal/GU      Musculoskeletal   Abdominal   Peds  Hematology  (+) anemia ,   Anesthesia Other Findings Bladder ca.obese. Ileal pouch. Hypotensive.  Reproductive/Obstetrics                             Anesthesia Physical Anesthesia Plan  ASA: III  Anesthesia Plan: General   Post-op Pain Management:    Induction: Intravenous  Airway Management Planned: Nasal Cannula  Additional Equipment:   Intra-op Plan:   Post-operative Plan:   Informed Consent: I have reviewed the patients History and Physical, chart, labs and discussed the procedure including the risks, benefits and alternatives for the proposed anesthesia with the patient or authorized representative who has indicated his/her understanding and acceptance.     Plan Discussed with: CRNA  Anesthesia Plan Comments:         Anesthesia Quick Evaluation

## 2016-06-20 NOTE — Progress Notes (Signed)
Pt has a Allevyn foam to sacrum. Skin underneath is pink, intact with no evidence of pressure injury.  Gluteal fold is discolored but is not a result of pressure injury.    Urostomy is leaking.  Requested supplies from nursing supervisor to change. Dorna Bloom RN CWOCN

## 2016-06-20 NOTE — Op Note (Signed)
[facility] Gastroenterology Patient Name: Victor Parks Procedure Date: 06/20/2016 11:43 AM MRN: [id number] Account #: [id number] Date of Birth: 1924-05-19 Admit Type: Inpatient Age: 82 Room: [facility] ENDO ROOM 4 Gender: Male Note Status: Finalized Procedure:            Upper GI endoscopy Indications:          Recent gastrointestinal bleeding Providers:            Jonathon Bellows MD, MD Referring MD:         Sallee Provencal (Referring MD) Medicines:            Monitored Anesthesia Care Complications:        No immediate complications. Procedure:            Pre-Anesthesia Assessment:                       - Prior to the procedure, a History and Physical was                        performed, and patient medications, allergies and                        sensitivities were reviewed. The patient's tolerance of                        previous anesthesia was reviewed.                       - The risks and benefits of the procedure and the                        sedation options and risks were discussed with the                        patient. All questions were answered and informed                        consent was obtained.                       - ASA Grade Assessment: III - A patient with severe                        systemic disease.                       After obtaining informed consent, the endoscope was                        passed under direct vision. Throughout the procedure,                        the patient's blood pressure, pulse, and oxygen                        saturations were monitored continuously. The Endoscope                        was introduced through the mouth, and advanced to the  third part of duodenum. The upper GI endoscopy was                        accomplished with ease. The patient tolerated the                        procedure well. Findings:      Localized mild mucosal changes characterized by congestion and  erythema       were found in the duodenal bulb. Biopsies were taken with a cold forceps       for histology.      A large hiatal hernia was present. 6-7 cm sliding in nature , normal       mucosa      Circumferential salmon-colored mucosa was present from 28 to 39 cm.       Ulcerations were present at 36 cm. The maximum longitudinal extent of       these esophageal mucosal changes was 11 cm in length.      One linear esophageal ulcer with no bleeding and no stigmata of recent       bleeding was found 36 cm from the incisors. The lesion was 2 mm in       largest dimension. This was biopsied with a cold forceps for histology. Impression:           - Mucosal changes in the duodenum. Biopsied.                       - Large hiatal hernia.                       - Salmon-colored mucosa suspicious for long-segment                        Barrett's esophagus.                       - Non-bleeding esophageal ulcer. Biopsied. Recommendation:       - Patient has a contact number available for                        emergencies. The signs and symptoms of potential                        delayed complications were discussed with the patient.                        Return to normal activities tomorrow. Written discharge                        instructions were provided to the patient.                       - Resume previous diet.                       - Continue present medications.                       - Await pathology results.                       - Repeat upper endoscopy in 6 weeks to evaluate  the                        response to therapy.                       - Return to GI clinic in 4 weeks.                       - Return patient to hospital ward for ongoing care.                       - Clear liquid diet today.                       - Use Prilosec (omeprazole) 40 mg PO daily for 6 weeks.                       - Follow an antireflux regimen indefinitely.                       - Use sucralfate  tablets 1 gram PO QID for 5 months. Procedure Code(s):    --- Professional ---                       [phone], Esophagogastroduodenoscopy, flexible, transoral;                        with biopsy, single or multiple Diagnosis Code(s):    --- Professional ---                       K31.89, Other diseases of stomach and duodenum                       K44.9, Diaphragmatic hernia without obstruction or                        gangrene                       K22.8, Other specified diseases of esophagus                       K22.10, Ulcer of esophagus without bleeding                       K92.2, Gastrointestinal hemorrhage, unspecified CPT copyright 2016 American Medical Association. All rights reserved. The codes documented in this report are preliminary and upon coder review may  be revised to meet current compliance requirements. Jonathon Bellows, MD Jonathon Bellows MD, MD 06/20/2016 12:03:45 PM This report has been signed electronically. Number of Addenda: 0 Note Initiated On: 06/20/2016 11:43 AM      [facility]

## 2016-06-20 NOTE — Progress Notes (Signed)
[place] INFECTIOUS DISEASE PROGRESS NOTE Date of Admission:  06/13/2016     ID: Victor Parks is a 82 y.o. male with sepsis, leukocytosis Principal Problem:   Dyspnea Active Problems:   Pressure injury of skin   Protein-calorie malnutrition, severe   Abdominal pain   Septic shock (HCC)   Severe sepsis with septic shock (HCC)   Acute blood loss anemia   Subjective: No fevers, had EGD done  ROS  Eleven systems are reviewed and negative except per hpi  Medications:  Antibiotics Given (last 72 hours)    Date/Time Action Medication Dose Rate   06/17/16 1839 Given   piperacillin-tazobactam (ZOSYN) IVPB 3.375 g 3.375 g 12.5 mL/hr   06/18/16 0024 Given   vancomycin (VANCOCIN) IVPB 1000 mg/200 mL premix 1,000 mg 200 mL/hr   06/18/16 0133 Given   piperacillin-tazobactam (ZOSYN) IVPB 3.375 g 3.375 g 12.5 mL/hr   06/18/16 1024 Given   piperacillin-tazobactam (ZOSYN) IVPB 3.375 g 3.375 g 12.5 mL/hr   06/18/16 1827 Given   piperacillin-tazobactam (ZOSYN) IVPB 3.375 g 3.375 g 12.5 mL/hr   06/19/16 0202 Given   piperacillin-tazobactam (ZOSYN) IVPB 3.375 g 3.375 g 12.5 mL/hr   06/19/16 1015 Given   piperacillin-tazobactam (ZOSYN) IVPB 3.375 g 3.375 g 12.5 mL/hr   06/19/16 1757 Given   piperacillin-tazobactam (ZOSYN) IVPB 3.375 g 3.375 g 12.5 mL/hr   06/20/16 0110 Given   piperacillin-tazobactam (ZOSYN) IVPB 3.375 g 3.375 g 12.5 mL/hr   06/20/16 0857 Given   piperacillin-tazobactam (ZOSYN) IVPB 3.375 g 3.375 g 12.5 mL/hr     . sodium chloride   Intravenous Once  . aspirin EC  81 mg Oral Once  . calcium-vitamin D  2 tablet Oral Daily  . feeding supplement  1 Container Oral TID BM  . mouth rinse  15 mL Mouth Rinse BID  . mometasone-formoterol  2 puff Inhalation BID  . multivitamin with minerals  1 tablet Oral Daily  . pantoprazole  40 mg Oral Daily  . piperacillin-tazobactam (ZOSYN)  IV  3.375 g Intravenous Q8H  . potassium citrate  10 mEq Oral BID  . sodium chloride  flush  3 mL Intravenous Q12H  . sucralfate  1 g Oral TID WC & HS  . vitamin C  1,000 mg Oral Daily    Objective: Vital signs in last 24 hours: Temp:  [97.9 F (36.6 C)-98.7 F (37.1 C)] 98.7 F (37.1 C) (11/22 1311) Pulse Rate:  [67-87] 70 (11/22 1311) Resp:  [14-21] 18 (11/22 1311) BP: (85-110)/(43-56) 109/53 (11/22 1311) SpO2:  [93 %-100 %] 96 % (11/22 1311) Weight:  [91.6 kg (202 lb)-91.7 kg (202 lb 3.2 oz)] 91.6 kg (202 lb) (11/22 1117) Constitutional: He is frail, lying in bed, but able to converse. Some memory loss.  HENT: anicteric, eomi Mouth/Throat: Oropharynx is clear and dry . No oropharyngeal exudate.  Cardiovascular: Normal rate, regular rhythm and normal heart sounds.  Pulmonary/Chest: Effort normal and breath sounds normal. Abdominal: Soft. Bowel sounds are normal. He exhibits no distension. There is no tenderness.  Urostomy site RLQ wnl, clear yellow urine Lymphadenopathy: He has no cervical adenopathy.  Neurological: He is alert and interactive. Moves all 4 EXT LUE with 2+ edema Skin: very dry scaly skin on arms and legs. Some ecchymosis.  Psychiatric: He has a normal mood and affect. His behavior is normal.   Lab Results  Recent Labs  06/19/16 0946 06/19/16 1650 06/20/16 1030  WBC 21.0*  --  18.5*  HGB 7.3* 7.3* 7.8*  HCT 21.5*  --  23.2*  NA 138  --  137  K 3.1*  --  4.0  CL 108  --  105  CO2 26  --  27  BUN 20  --  14  CREATININE 0.90  --  0.84    Microbiology: Results for orders placed or performed during the hospital encounter of 06/13/16  Culture, blood (single) w Reflex to ID Panel     Status: None   Collection Time: 06/14/16 12:59 PM  Result Value Ref Range Status   Specimen Description BLOOD RIGHT ARM  Final   Special Requests BOTTLES DRAWN AEROBIC AND ANAEROBIC [place]  Final   Culture NO GROWTH 5 DAYS  Final   Report Status 06/19/2016 FINAL  Final  Urine culture     Status: Abnormal   Collection Time: 06/15/16  3:25 PM   Result Value Ref Range Status   Specimen Description URINE, RANDOM  Final   Special Requests Normal  Final   Culture (A)  Final    >=100,000 COLONIES/mL ENTEROCOCCUS FAECALIS >=100,000 COLONIES/mL CITROBACTER FREUNDII    Report Status 06/18/2016 FINAL  Final   Organism ID, Bacteria ENTEROCOCCUS FAECALIS (A)  Final   Organism ID, Bacteria CITROBACTER FREUNDII (A)  Final      Susceptibility   Citrobacter freundii - MIC*    CEFAZOLIN >=64 RESISTANT Resistant     CEFTRIAXONE <=1 SENSITIVE Sensitive     CIPROFLOXACIN <=0.25 SENSITIVE Sensitive     GENTAMICIN <=1 SENSITIVE Sensitive     IMIPENEM <=0.25 SENSITIVE Sensitive     NITROFURANTOIN <=16 SENSITIVE Sensitive     TRIMETH/SULFA >=320 RESISTANT Resistant     PIP/TAZO <=4 SENSITIVE Sensitive     * >=100,000 COLONIES/mL CITROBACTER FREUNDII   Enterococcus faecalis - MIC*    AMPICILLIN <=2 SENSITIVE Sensitive     LEVOFLOXACIN >=8 RESISTANT Resistant     NITROFURANTOIN <=16 SENSITIVE Sensitive     VANCOMYCIN 1 SENSITIVE Sensitive     * >=100,000 COLONIES/mL ENTEROCOCCUS FAECALIS  CULTURE, BLOOD (ROUTINE X 2) w Reflex to ID Panel     Status: None   Collection Time: 06/15/16  3:35 PM  Result Value Ref Range Status   Specimen Description BLOOD LEFT HAND  Final   Special Requests   Final    BOTTLES DRAWN AEROBIC AND ANAEROBIC  AER 12CC ANA [place]   Culture NO GROWTH 5 DAYS  Final   Report Status 06/20/2016 FINAL  Final  CULTURE, BLOOD (ROUTINE X 2) w Reflex to ID Panel     Status: None   Collection Time: 06/15/16  3:35 PM  Result Value Ref Range Status   Specimen Description BLOOD RIGHT HAND  Final   Special Requests   Final    BOTTLES DRAWN AEROBIC AND ANAEROBIC  AER [place] ANA [place]   Culture NO GROWTH 5 DAYS  Final   Report Status 06/20/2016 FINAL  Final  MRSA PCR Screening     Status: None   Collection Time: 06/16/16 10:35 PM  Result Value Ref Range Status   MRSA by PCR NEGATIVE NEGATIVE Final    Comment:        The GeneXpert MRSA  Assay (FDA approved for NASAL specimens only), is one component of a comprehensive MRSA colonization surveillance program. It is not intended to diagnose MRSA infection nor to guide or monitor treatment for MRSA infections.     Studies/Results: US Venous Img Upper Uni Right  Result Date: 06/20/2016 CLINICAL DATA:  Both right upper  extremity edema. History of bladder cancer and smoking. Evaluate for DVT. EXAM: RIGHT UPPER EXTREMITY VENOUS DOPPLER ULTRASOUND TECHNIQUE: Gray-scale sonography with graded compression, as well as color Doppler and duplex ultrasound were performed to evaluate the upper extremity deep venous system from the level of the subclavian vein and including the jugular, axillary, basilic, radial, ulnar and upper cephalic vein. Spectral Doppler was utilized to evaluate flow at rest and with distal augmentation maneuvers. COMPARISON:  None. FINDINGS: Contralateral Subclavian Vein: Respiratory phasicity is normal and symmetric with the symptomatic side. No evidence of thrombus. Normal compressibility. Internal Jugular Vein: No evidence of thrombus. Normal compressibility, respiratory phasicity and response to augmentation. Subclavian Vein: No evidence of thrombus. Normal compressibility, respiratory phasicity and response to augmentation. Axillary Vein: No evidence of thrombus. Normal compressibility, respiratory phasicity and response to augmentation. Cephalic Vein: There is hypoechoic occlusive thrombus seen throughout the proximal (image 15) mid (image 16) and distal (image 17) aspects of the right cephalic vein. There is no definitive extension of this occlusive superficial thrombophlebitis to the deep venous system of the right upper extremity. New Basilic Vein: No evidence of thrombus. Normal compressibility, respiratory phasicity and response to augmentation. Brachial Veins: No evidence of thrombus. Normal compressibility, respiratory phasicity and response to augmentation.  Radial Veins: No evidence of thrombus. Normal compressibility, respiratory phasicity and response to augmentation. Ulnar Veins: No evidence of thrombus. Normal compressibility, respiratory phasicity and response to augmentation. Venous Reflux:  None visualized. Other Findings: There is a moderate amount of subcutaneous edema noted at the patient's area of swelling within in the right antecubital fossa (images 35 and 36) IMPRESSION: 1. No evidence DVT within right upper extremity. 2. Examination positive for occlusive superficial thrombophlebitis seen throughout the imaged course of the right cephalic vein. 3. Moderate amount of subcutaneous edema at the level of the antecubital fossa correlating with the patient's area of swelling. Electronically Signed   By: Sandi Mariscal M.D.   On: 06/20/2016 08:17    Assessment/Plan: Victor Parks is a 82 y.o. male former [employer] admitted with sob, wheeze, cough and progressed rapidly to abd pain, markedly elevated wbc, elevated lactate. Also developed profound anemia and some TCP and required 2 units PRBC. He had CT showing only fecal impaction. BCX have been neg, UCX with enterococcus and Citrobacter and UA with 6-30 wbc. Has urostomy for many years from prior bladder cancer but this has been working wel He has had ongoing issues with abd pain for over 9 months. He had episode RUQ abd pain as well 10/31 and had WU in ED with neg CT scan. Did have evidence UTI so given ctx and then dced on bactrim. Also admitted at [facility] in March twice initially with what was felt to be SBO but CT neg and then readmitted with explosive diarrhea.   He has had 3 CT scans in last 3 weeks without evidence of abscess or source of infection. He does have chronic constipation and CT showed some fecal impaction  He has been treated with zosyn and vanco and received azithromycin as well.  Today is day 5 of IV zosyn. It is unclear what is causing his repeated episodes of abd pain and his  recent episode of sepsis.  Interestingly as his Hgb dropped his BUN increased markedly suggesting possible GIB. Steroids likely also contributed to increased wbc.  Cultures negative. He may have had sepsis from translocation of bacteria across bowel wall given severe impaction. Regardless he is clinically improving EGD done 11/22. No  fevers and wbc down to 18, hgb stable 7.8 and plt up some to 151   Recommendations No further imaging studies at this time.  Finish a 10 day course of abx for possible GI sepsis.  When ready for dc would change to oral augmentin but continue zosyn while inpatient Thank you very much for the consult. Will follow with you.  Victor Parks P   06/20/2016, 1:45 PM

## 2016-06-20 NOTE — NC FL2 (Signed)
[place] LEVEL OF CARE SCREENING TOOL     IDENTIFICATION  Patient Name: Victor Parks Birthdate: 1923/08/08 Sex: male Admission Date (Current Location): 06/13/2016  [place] and [place] Number:  Engineering geologist and Address:  [facility], [address] [place], [place] [address]      Provider Number: [id number]  Attending Physician Name and Address:  Victor Bow, MD  Relative Name and Phone Number:  Victor Parks, Victor Parks Daughter [phone] or     Current Level of Care: Hospital Recommended Level of Care: Victor Parks Prior Approval Number:    Date Approved/Denied:   PASRR Number: [id number]  Discharge Plan: SNF    Current Diagnoses: Patient Active Problem List   Diagnosis Date Noted  . Severe sepsis with septic shock ([place]) 06/17/2016  . Acute blood loss anemia 06/17/2016  . Septic shock ([place]) 06/16/2016  . Protein-calorie malnutrition, severe 06/15/2016  . Abdominal pain   . Dyspnea 06/13/2016  . Pressure injury of skin 06/13/2016    Orientation RESPIRATION BLADDER Height & Weight     Self, Time, Place  Normal Continent Weight: 202 lb (91.6 kg) Height:  5\' 6"  (167.6 cm)  BEHAVIORAL SYMPTOMS/MOOD NEUROLOGICAL BOWEL NUTRITION STATUS      Continent Diet (Cardiac)  AMBULATORY STATUS COMMUNICATION OF NEEDS Skin   Supervision Verbally Normal                       Personal Care Assistance Level of Assistance  Bathing, Feeding, Dressing Bathing Assistance: Limited assistance Feeding assistance: Independent Dressing Assistance: Limited assistance     Functional Limitations Info  Sight, Hearing, Speech Sight Info: Adequate Hearing Info: Adequate Speech Info: Adequate    SPECIAL CARE FACTORS FREQUENCY  PT (By licensed PT)     PT Frequency: 5x a week              Contractures Contractures Info: Not present    Additional Factors Info  Code Status,  Allergies Code Status Info: DNR Allergies Info: Ciprofloxacin           Current Medications (06/20/2016):  This is the current hospital active medication list Current Facility-Administered Medications  Medication Dose Route Frequency Provider Last Rate Last Dose  . [MAR Hold] 0.9 %  sodium chloride infusion  250 mL Intravenous PRN Victor Shelling, MD      . Doug Sou Hold] 0.9 %  sodium chloride infusion   Intravenous Once [employer], DO      . [MAR Hold] acetaminophen (TYLENOL) tablet 650 mg  650 mg Oral Q6H PRN Victor Shelling, MD       Or  . Doug Sou Hold] acetaminophen (TYLENOL) suppository 650 mg  650 mg Rectal Q6H PRN Victor Shelling, MD      . Doug Sou Hold] aspirin EC tablet 81 mg  81 mg Oral Once Victor Shelling, MD      . Doug Sou Hold] calcium-vitamin D (OSCAL WITH D) 500-200 MG-UNIT per tablet 2 tablet  2 tablet Oral Daily Victor Shelling, MD   2 tablet at 06/19/16 1016  . [MAR Hold] feeding supplement (BOOST / RESOURCE BREEZE) liquid 1 Container  1 Container Oral TID BM Victor Hugelmeyer, DO   1 Container at 06/19/16 2133  . [MAR Hold] ipratropium-albuterol (DUONEB) 0.5-2.5 (3) MG/3ML nebulizer solution 3 mL  3 mL Nebulization Q6H PRN Victor Spray, NP      . [MAR Hold] ipratropium-albuterol (DUONEB) 0.5-2.5 (3) MG/3ML nebulizer solution 3 mL  3 mL  Nebulization Q4H PRN Victor Bow, MD      . Doug Sou Hold] MEDLINE mouth rinse  15 mL Mouth Rinse BID Victor Bow, MD   15 mL at 06/20/16 1057  . [MAR Hold] mometasone-formoterol (DULERA) 100-5 MCG/ACT inhaler 2 puff  2 puff Inhalation BID Victor Bow, MD   2 puff at 06/20/16 0857  . [MAR Hold] multivitamin with minerals tablet 1 tablet  1 tablet Oral Daily Victor Shelling, MD   1 tablet at 06/19/16 1016  . [MAR Hold] ondansetron (ZOFRAN) injection 4 mg  4 mg Intravenous Q6H PRN Victor Shelling, MD   4 mg at 06/14/16 1221  . [MAR Hold] piperacillin-tazobactam (ZOSYN) IVPB 3.375 g  3.375 g Intravenous Q8H Victor Parks, RPH   3.375 g at 06/20/16 0857   . [MAR Hold] potassium citrate (UROCIT-K) SR tablet 10 mEq  10 mEq Oral BID Victor Shelling, MD   10 mEq at 06/19/16 2133  . [MAR Hold] senna-docusate (Senokot-S) tablet 1 tablet  1 tablet Oral QHS PRN Victor Shelling, MD      . Doug Sou Hold] simethicone (MYLICON) chewable tablet 80 mg  80 mg Oral Q6H PRN Victor Shelling, MD   80 mg at 06/19/16 2322  . [MAR Hold] sodium chloride flush (NS) 0.9 % injection 3 mL  3 mL Intravenous Q12H Victor Pyreddy, MD   3 mL at 06/19/16 2200  . [MAR Hold] sodium chloride flush (NS) 0.9 % injection 3 mL  3 mL Intravenous PRN Victor Shelling, MD      . Doug Sou Hold] traMADol (ULTRAM) tablet 50 mg  50 mg Oral Q12H PRN Victor Parks, RPH   50 mg at 06/19/16 2323  . [MAR Hold] vitamin C (ASCORBIC ACID) tablet 1,000 mg  1,000 mg Oral Daily Victor Shelling, MD   1,000 mg at 06/19/16 1016     Discharge Medications: Please see discharge summary for a list of discharge medications.  Relevant Imaging Results:  Relevant Lab Results:   Additional Information SSN [id number]  Victor Parks

## 2016-06-20 NOTE — Clinical Social Work Note (Signed)
MSW attempted to contact patient's daughter to present bed offers.  MSW unable to get a hold of patient's daughter, voice mail has not been set up.  Patient's daughter did say she was interested in [facility], who offered but, MSW has not been able to confirm with daughter SNF placement.  MSW to continue to follow patient's progress throughout discharge planning.  Jones Broom. Norval Morton, MSW [phone]  Mon-Fri 8a-4:30p 06/20/2016 5:35 PM

## 2016-06-20 NOTE — Progress Notes (Signed)
Per night shift RN, pt's wife stayed the night with pt.  Pt's wife became very confused, c/o pain in her back so bad that she cannot answer any questions.  Staff concerned that pt's wife may fall and wife seen wondering the halls at times.  Pt is more alert this morning, knows that pt and herself lives at [facility] and that she is at the hospital with her husband. This nurse called and spoke with pt's daughter Vaughan Basta and made her aware of the situation.  Family made aware that pt's wife cannot stay the night alone and will be here within the hour.  Clarise Cruz, RN

## 2016-06-20 NOTE — Progress Notes (Signed)
Patient ID: Hillel Laitinen, male   DOB: 1923/10/30, 82 y.o.   MRN: [id number]   [place] [facility] [place] NAME: Victor Parks    MR#:  [id number]  DATE OF BIRTH:  March 13, 1924  SUBJECTIVE:  CHIEF COMPLAINT:   Chief Complaint  Patient presents with  . Cough   Transferred to ICU due to drop in hemoglobin and elevated lactic acid.  Today patient seems comfortable without any pain. Blood pressure normal. Received 2 units packed RBC this admission.  Had multiple bowel movements after an enema without any blood or black stools 2 days back But he did have black tarry stool yesterday.  REVIEW OF SYSTEMS:  ROS CONSTITUTIONAL: No fever, fatigue or weakness.  EYES: No blurred or double vision.  EARS, NOSE, AND THROAT: No tinnitus or ear pain.  RESPIRATORY: Negative cough denies shortness of breath, wheezing or hemoptysis.  CARDIOVASCULAR: No chest pain, orthopnea, edema.  GASTROINTESTINAL: No nausea, vomiting, diarrhea.No abdominal pain. Positive constipation. GENITOURINARY: No dysuria, hematuria.  ENDOCRINE: No polyuria, nocturia,  HEMATOLOGY: No anemia, easy bruising or bleeding SKIN: No rash or lesion. MUSCULOSKELETAL: No joint pain or arthritis.  NEUROLOGIC: No tingling, numbness, weakness.  PSYCHIATRY: No anxiety or depression.  DRUG ALLERGIES:   Allergies  Allergen Reactions  . Ciprofloxacin Anaphylaxis   VITALS:  Blood pressure (!) 89/43, pulse 67, temperature 98 F (36.7 C), temperature source Oral, resp. rate 16, height 5\' 6"  (1.676 m), weight 91.6 kg (202 lb), SpO2 95 %. PHYSICAL EXAMINATION:  Physical Exam  Physical Exam  GENERAL: 82 y.o.-year-old patient lying in the bed with mild acute distress.  EYES: Pupils equal, round, reactive to light and accommodation. No scleral icterus. Extraocular muscles intact.  HEENT: Head atraumatic, normocephalic. Oropharynx and nasopharynx clear.  NECK: Supple, no jugular venous  distention. No thyroid enlargement, no tenderness.  LUNGS: Normal breath sounds bilaterally, no wheezing, rales,rhonchi or crepitation. No use of accessory muscles of respiration.  CARDIOVASCULAR: S1, S2 normal. No murmurs, rubs, or gallops.  ABDOMEN: Soft, nontender, nondistended. Bowel sounds present. No organomegaly or mass.  EXTREMITIES: No pedal edema, cyanosis, or clubbing.  NEUROLOGIC: Cranial nerves II through XII are intact. Muscle strength 5/5 in all extremities. Sensation intact. Gait not checked.  PSYCHIATRIC: The patient is awake. Oriented to place but not time Bruising and edema of right upper extremity. LABORATORY PANEL:   CBC  Recent Labs Lab 06/20/16 1030  WBC 18.5*  HGB 7.8*  HCT 23.2*  PLT 151   ------------------------------------------------------------------------------------------------------------------ Chemistries   Recent Labs Lab 06/16/16 2024  06/19/16 0946 06/20/16 1030  NA 136  < > 138 137  K 4.1  < > 3.1* 4.0  CL 108  < > 108 105  CO2 19*  < > 26 27  GLUCOSE 177*  < > 104* 110*  BUN 99*  < > 20 14  CREATININE 1.41*  < > 0.90 0.84  CALCIUM 8.0*  < > 7.1* 7.6*  MG 2.2  --   --   --   AST  --   < > 19  --   ALT  --   < > 14*  --   ALKPHOS  --   < > 45  --   BILITOT  --   < > 0.6  --   < > = values in this interval not displayed. RADIOLOGY:  US Venous Img Upper Uni Right  Result Date: 06/20/2016 CLINICAL DATA:  Both right upper extremity edema. History  of bladder cancer and smoking. Evaluate for DVT. EXAM: RIGHT UPPER EXTREMITY VENOUS DOPPLER ULTRASOUND TECHNIQUE: Gray-scale sonography with graded compression, as well as color Doppler and duplex ultrasound were performed to evaluate the upper extremity deep venous system from the level of the subclavian vein and including the jugular, axillary, basilic, radial, ulnar and upper cephalic vein. Spectral Doppler was utilized to evaluate flow at rest and with distal augmentation maneuvers.  COMPARISON:  None. FINDINGS: Contralateral Subclavian Vein: Respiratory phasicity is normal and symmetric with the symptomatic side. No evidence of thrombus. Normal compressibility. Internal Jugular Vein: No evidence of thrombus. Normal compressibility, respiratory phasicity and response to augmentation. Subclavian Vein: No evidence of thrombus. Normal compressibility, respiratory phasicity and response to augmentation. Axillary Vein: No evidence of thrombus. Normal compressibility, respiratory phasicity and response to augmentation. Cephalic Vein: There is hypoechoic occlusive thrombus seen throughout the proximal (image 15) mid (image 16) and distal (image 17) aspects of the right cephalic vein. There is no definitive extension of this occlusive superficial thrombophlebitis to the deep venous system of the right upper extremity. New Basilic Vein: No evidence of thrombus. Normal compressibility, respiratory phasicity and response to augmentation. Brachial Veins: No evidence of thrombus. Normal compressibility, respiratory phasicity and response to augmentation. Radial Veins: No evidence of thrombus. Normal compressibility, respiratory phasicity and response to augmentation. Ulnar Veins: No evidence of thrombus. Normal compressibility, respiratory phasicity and response to augmentation. Venous Reflux:  None visualized. Other Findings: There is a moderate amount of subcutaneous edema noted at the patient's area of swelling within in the right antecubital fossa (images 35 and 36) IMPRESSION: 1. No evidence DVT within right upper extremity. 2. Examination positive for occlusive superficial thrombophlebitis seen throughout the imaged course of the right cephalic vein. 3. Moderate amount of subcutaneous edema at the level of the antecubital fossa correlating with the patient's area of swelling. Electronically Signed   By: Sandi Mariscal M.D.   On: 06/20/2016 08:17   ASSESSMENT AND PLAN:   82 year old elderly male  patient with history of bladder cancer, hypertension, decubitus ulcer, UTI Admitted on 06/13/2016 for bronchitis and AKI.  1. Abdominal pain, intermittent- Resolved  due to constipation. CT scan of the abdomen and pelvis showed nothing acute.  2. Sepsis Thought to be intra-abdominal source. CT scan of the abdomen and pelvis normal. On IV Zosyn. Cultures negative. WBC slowly trending down. Appreciate infectious disease input Cultures no growth to date. Plan is to discharge patient home on Augmentin for 1 more week.  3. Anemia, significant decrease in hemoglobin and hematocrit - due to GI bleed 2 units blood transfused. Initially stool did not show any blood but had black tarry stools yesterday. EGD today. Hemoglobin stable.  5. Acute kidney injury - resolved IV fluids stopped  6..Decubitus ulcer  7.Hypertension-stable. Continue close monitoring  8. Acute encephalopathy likely due to acute illness.  9. RUE swelling - improved venous dopplers. Showed superficial thrombophlebitis. No DVT  All the records are reviewed and case discussed with Care Management/Social Worker. Management plans discussed with the patient, family and they are in agreement.  CODE STATUS: DO NOT RESUSCITATE  TOTAL TIME TAKING CARE OF THIS PATIENT: 81minutes.   POSSIBLE D/C IN 1-2DAYS, DEPENDING ON CLINICAL CONDITION. May need skilled nursing facility. Physical therapy consult.  [name] R M.D. on 06/20/2016 at 11:40 AM  Between 7am to 6pm - Pager - [phone]  After 6pm go to [email or web address] - Proofreader  [employer]  [phone]  CC: Primary care physician; [name], MD  Note: This dictation was prepared with Dragon dictation along with smaller phrase technology. Any transcriptional errors that result from this process are unintentional.

## 2016-06-20 NOTE — Progress Notes (Signed)
EGD performed today    No blood anywhere in upper GI tract. Long segment in the esophagus 11 cm suspicious for Barrettes. Has a large hiatal hernia. He had a long deep ulcer with a clean base within the segment of suspected Barrettes. No therapy performed, not bleeding. Biopsy taken from edge.   He may have bled from the large ulcer in the esophagus.  Plan  1. Stop Potassium chloride tablets- it may at times cause an ulcer due to pill esophagitis 2. Omeprazole 40 mg once daily before breakfast 3. Carafate 1 gm QID for 6 weeks 4. Repeat EGD in 6 weeks to check for healing , will follow up bx to rule out malignancy  5. If Hb stable and no overt GI bleeding can be fed and d.c with OP GI follow up 6. Keep head end elevated to 45 degrees at night to reduce damage from reflux.   Dr Jonathon Bellows  Gastroenterology/Hepatology Pager: [phone]

## 2016-06-20 NOTE — Plan of Care (Signed)
Problem: Education: Goal: Knowledge of [facility] Education information/materials will improve Outcome: Progressing Per son in law  Problem: Safety: Goal: Ability to remain free from injury will improve Outcome: Progressing Specialty low bed  Problem: Health Behavior/Discharge Planning: Goal: Ability to manage health-related needs will improve Outcome: Progressing Per family  Problem: Pain Managment: Goal: General experience of comfort will improve Outcome: Progressing Pain medication x 1 this shift  Problem: Activity: Goal: Risk for activity intolerance will decrease Outcome: Not Progressing Low hgb hindering mobility  Problem: Nutrition: Goal: Adequate nutrition will be maintained Outcome: Progressing Tolerating boost suppliment  Problem: Bowel/Gastric: Goal: Will not experience complications related to bowel motility Outcome: Not Progressing Suspected GIB with 3 loose black stools this shift.

## 2016-06-20 NOTE — Transfer of Care (Signed)
Immediate Anesthesia Transfer of Care Note  Patient: Victor Parks  Procedure(s) Performed: Procedure(s): ESOPHAGOGASTRODUODENOSCOPY (EGD) WITH PROPOFOL (N/A)  Patient Location: PACU  Anesthesia Type:General  Level of Consciousness: sedated and responds to stimulation  Airway & Oxygen Therapy: Patient Spontanous Breathing and Patient connected to nasal cannula oxygen  Post-op Assessment: Report given to RN and Post -op Vital signs reviewed and stable  Post vital signs: Reviewed and stable  Last Vitals:  Vitals:   06/20/16 1117 06/20/16 1202  BP: (!) 89/43 (!) 89/48  Pulse:  82  Resp: 16 (!) 21  Temp:      Last Pain:  Vitals:   06/20/16 0400  TempSrc: Oral  PainSc:       Patients Stated Pain Goal: 0 ([id number])  Complications: No apparent anesthesia complications

## 2016-06-21 LAB — CBC WITH DIFFERENTIAL/PLATELET
BASOS PCT: 0 %
Basophils Absolute: 0 10*3/uL (ref 0–0.1)
EOS PCT: 1 %
Eosinophils Absolute: 0.2 10*3/uL (ref 0–0.7)
HCT: 22.1 % — ABNORMAL LOW (ref 40.0–52.0)
HEMOGLOBIN: 7.5 g/dL — AB (ref 13.0–18.0)
LYMPHS ABS: 2.6 10*3/uL (ref 1.0–3.6)
Lymphocytes Relative: 17 %
MCH: 31.7 pg (ref 26.0–34.0)
MCHC: 34.1 g/dL (ref 32.0–36.0)
MCV: 93.1 fL (ref 80.0–100.0)
MONO ABS: 0.9 10*3/uL (ref 0.2–1.0)
MONOS PCT: 6 %
NEUTROS PCT: 76 %
Neutro Abs: 11.5 10*3/uL — ABNORMAL HIGH (ref 1.4–6.5)
Platelets: 173 10*3/uL (ref 150–440)
RBC: 2.37 MIL/uL — AB (ref 4.40–5.90)
RDW: 15.9 % — ABNORMAL HIGH (ref 11.5–14.5)
WBC: 15.2 10*3/uL — AB (ref 3.8–10.6)

## 2016-06-21 NOTE — Progress Notes (Signed)
[place] [facility] [place] NAME: Victor Parks    MR#:  [id number]  DATE OF BIRTH:  1924-06-14  SUBJECTIVE:   No Melanotic stools last night  REVIEW OF SYSTEMS:    Review of Systems  Constitutional: Negative.  Negative for chills, fever and malaise/fatigue.  HENT: Negative.  Negative for ear discharge, ear pain, hearing loss, nosebleeds and sore throat.   Eyes: Negative.  Negative for blurred vision and pain.  Respiratory: Negative.  Negative for cough, hemoptysis, shortness of breath and wheezing.   Cardiovascular: Negative.  Negative for chest pain, palpitations and leg swelling.  Gastrointestinal: Negative.  Negative for abdominal pain, blood in stool, diarrhea, nausea and vomiting.  Genitourinary: Negative.  Negative for dysuria.  Musculoskeletal: Negative.  Negative for back pain.  Skin:       Stage 2 pressure ulcer  Neurological: Negative for dizziness, tremors, speech change, focal weakness, seizures and headaches.  Endo/Heme/Allergies: Negative.  Does not bruise/bleed easily.  Psychiatric/Behavioral: Negative.  Negative for depression, hallucinations and suicidal ideas.    Tolerating Diet: yes      DRUG ALLERGIES:   Allergies  Allergen Reactions  . Ciprofloxacin Anaphylaxis    VITALS:  Blood pressure (!) 109/48, pulse 76, temperature 98 F (36.7 C), temperature source Oral, resp. rate 20, height 5\' 6"  (1.676 m), weight 91.6 kg (202 lb), SpO2 96 %.  PHYSICAL EXAMINATION:   Physical Exam  Constitutional: He is oriented to person, place, and time and well-developed, well-nourished, and in no distress. No distress.  HENT:  Head: Normocephalic.  Eyes: No scleral icterus.  Neck: Normal range of motion. Neck supple. No JVD present. No tracheal deviation present.  Cardiovascular: Normal rate, regular rhythm and normal heart sounds.  Exam reveals no gallop and no friction rub.   No murmur heard. Pulmonary/Chest: Effort  normal and breath sounds normal. No respiratory distress. He has no wheezes. He has no rales. He exhibits no tenderness.  Abdominal: Soft. Bowel sounds are normal. He exhibits no distension and no mass. There is no tenderness. There is no rebound and no guarding.  urostomy  Musculoskeletal: Normal range of motion. He exhibits no edema.  Neurological: He is alert and oriented to person, place, and time.  Skin: Skin is warm. No rash noted.  RUE swelling redness no pain Stage 2 sacral ulcer  Psychiatric: Affect and judgment normal.      LABORATORY PANEL:   CBC  Recent Labs Lab 06/21/16 0716  WBC 15.2*  HGB 7.5*  HCT 22.1*  PLT 173   ------------------------------------------------------------------------------------------------------------------  Chemistries   Recent Labs Lab 06/16/16 2024  06/19/16 0946 06/20/16 1030  NA 136  < > 138 137  K 4.1  < > 3.1* 4.0  CL 108  < > 108 105  CO2 19*  < > 26 27  GLUCOSE 177*  < > 104* 110*  BUN 99*  < > 20 14  CREATININE 1.41*  < > 0.90 0.84  CALCIUM 8.0*  < > 7.1* 7.6*  MG 2.2  --   --   --   AST  --   < > 19  --   ALT  --   < > 14*  --   ALKPHOS  --   < > 45  --   BILITOT  --   < > 0.6  --   < > = values in this interval not displayed. ------------------------------------------------------------------------------------------------------------------  Cardiac Enzymes  Recent Labs Lab 06/16/16 2024  06/17/16 0809  TROPONINI <0.03 <0.03   ------------------------------------------------------------------------------------------------------------------  RADIOLOGY:  US Venous Img Upper Uni Right  Result Date: 06/20/2016 CLINICAL DATA:  Both right upper extremity edema. History of bladder cancer and smoking. Evaluate for DVT. EXAM: RIGHT UPPER EXTREMITY VENOUS DOPPLER ULTRASOUND TECHNIQUE: Gray-scale sonography with graded compression, as well as color Doppler and duplex ultrasound were performed to evaluate the upper  extremity deep venous system from the level of the subclavian vein and including the jugular, axillary, basilic, radial, ulnar and upper cephalic vein. Spectral Doppler was utilized to evaluate flow at rest and with distal augmentation maneuvers. COMPARISON:  None. FINDINGS: Contralateral Subclavian Vein: Respiratory phasicity is normal and symmetric with the symptomatic side. No evidence of thrombus. Normal compressibility. Internal Jugular Vein: No evidence of thrombus. Normal compressibility, respiratory phasicity and response to augmentation. Subclavian Vein: No evidence of thrombus. Normal compressibility, respiratory phasicity and response to augmentation. Axillary Vein: No evidence of thrombus. Normal compressibility, respiratory phasicity and response to augmentation. Cephalic Vein: There is hypoechoic occlusive thrombus seen throughout the proximal (image 15) mid (image 16) and distal (image 17) aspects of the right cephalic vein. There is no definitive extension of this occlusive superficial thrombophlebitis to the deep venous system of the right upper extremity. New Basilic Vein: No evidence of thrombus. Normal compressibility, respiratory phasicity and response to augmentation. Brachial Veins: No evidence of thrombus. Normal compressibility, respiratory phasicity and response to augmentation. Radial Veins: No evidence of thrombus. Normal compressibility, respiratory phasicity and response to augmentation. Ulnar Veins: No evidence of thrombus. Normal compressibility, respiratory phasicity and response to augmentation. Venous Reflux:  None visualized. Other Findings: There is a moderate amount of subcutaneous edema noted at the patient's area of swelling within in the right antecubital fossa (images 35 and 36) IMPRESSION: 1. No evidence DVT within right upper extremity. 2. Examination positive for occlusive superficial thrombophlebitis seen throughout the imaged course of the right cephalic vein. 3.  Moderate amount of subcutaneous edema at the level of the antecubital fossa correlating with the patient's area of swelling. Electronically Signed   By: Sandi Mariscal M.D.   On: 06/20/2016 08:17     ASSESSMENT AND PLAN:    82 year old male with a history of bladder cancer, essential hypertension admitted for abdominal pain and acute kidney injury  1. Sepsis from translocation of bacteria across bowel wall given severe impaction: With urine culture positive for enterococcus and Citrobacter Appreciate ID consult Patient will continue on Zosyn and I discharged changed to oral Augmentin  2. Intermittent abdominal pain which is resolved due to constipation CT on admission showed no acute etiology  3. Acute on chronic anemia status post 2 unit PRBC EGD on November 22 shows Long segment in the esophagus 11 cm suspicious for Barrettes. Has a large hiatal hernia. He had a long deep ulcer with a clean base within the segment of suspected Barrettes. He may have bled from large ulcer and esophagitis Continue PPI daily and Carafate for 6 weeks Repeat EGD in 6 weeks to check for healing Follow hemoglobin   4. Acute kidney injury which is resolved  5. Stage II sacral ulcer present on admission: Cleanse buttocks with soap and water daily and PRN soilage.  No disposable briefs or underpads.  Barrier cream twice daily.   6. Acute encephalopathy resolved  7. Right upper extremity swelling with superficial thrombophlebitis no DVT on venous Dopplers  Management plans discussed with the patient and he  is in agreement.  CODE STATUS: DNR  TOTAL TIME TAKING CARE OF THIS PATIENT: 22 minutes.     POSSIBLE D/C tomorrow to SNF, DEPENDING ON CLINICAL CONDITION.   Lacheryl Niesen M.D on 06/21/2016 at 9:48 AM  Between 7am to 6pm - Pager - [phone] After 6pm go to [email or web address] - password [email or web address] [facility]  Office  [phone]  CC: Primary care physician; [name],  MD  Note: This dictation was prepared with Dragon dictation along with smaller phrase technology. Any transcriptional errors that result from this process are unintentional.

## 2016-06-21 NOTE — Evaluation (Signed)
Physical Therapy Re-Evaluation Patient Details Name: Victor Parks MRN: [id number] DOB: March 16, 1924 Today's Date: 06/21/2016   History of Present Illness  Patient is an 82 y/o male that presents after shortness of breath, chest pain, cough. While admitted he developed increased WBC, increased lactate, and acute onset Hgb drop requiring transfusions.   Clinical Impression  Patient seen for re-evaluation after transfer to CCU. He underwent EGD yesterday. Patient appears to have improved his strength somewhat from previous PT session, though still quite limited. He is able to provide good amount of assistance with bed mobility and transfers, however impulsively sits in chair set up next to bed rather than ambulating to hallway due to fatigue. Given his prolonged bedrest, he most certainly requires therapy to improve his strength and endurance with mobility.     Follow Up Recommendations SNF    Equipment Recommendations  Rolling walker with 5" wheels    Recommendations for Other Services       Precautions / Restrictions Precautions Precautions: Fall Restrictions Weight Bearing Restrictions: No      Mobility  Bed Mobility Overal bed mobility: Needs Assistance Bed Mobility: Supine to Sit     Supine to sit: Min guard;Min assist;HOB elevated     General bed mobility comments: Patient requires assistance to bring LEs off the edge of the bed and requires assistance to bring trunk off the bed surface.   Transfers Overall transfer level: Needs assistance Equipment used: Rolling walker (2 wheeled) Transfers: Sit to/from Stand Sit to Stand: Min assist;Mod assist         General transfer comment: Patient is able to provide much improved transfer this date, though still leaning forward and requiring PT assistance to complete.  Ambulation/Gait Ambulation/Gait assistance: Min assist Ambulation Distance (Feet): 2 Feet Assistive device: Rolling walker (2 wheeled)       General  Gait Details: Patient impulsively decides to sit after 1 step, transfers self to chair. Unable to truly assess gait any further.   Stairs            Wheelchair Mobility    Modified Rankin (Stroke Patients Only)       Balance Overall balance assessment: Needs assistance Sitting-balance support: Bilateral upper extremity supported;Feet supported Sitting balance-Leahy Scale: Fair     Standing balance support: Bilateral upper extremity supported Standing balance-Leahy Scale: Poor                               Pertinent Vitals/Pain Pain Assessment: No/denies pain    Home Living Family/patient expects to be discharged to:: Assisted living               Home Equipment: Gilford Rile - 2 wheels Additional Comments: Lives at [facility] with his wife.    Prior Function Level of Independence: Needs assistance   Gait / Transfers Assistance Needed: Independent with ambulation using RW  ADL's / Homemaking Assistance Needed: Assist for meals, medications, laundry, and cleaning        Hand Dominance        Extremity/Trunk Assessment   Upper Extremity Assessment: Generalized weakness           Lower Extremity Assessment: Generalized weakness         Communication   Communication: No difficulties  Cognition Arousal/Alertness: Awake/alert Behavior During Therapy: WFL for tasks assessed/performed Overall Cognitive Status: Within Functional Limits for tasks assessed (Appears to have some baseline cognitive deficits, but appears at baseline)  General Comments      Exercises     Assessment/Plan    PT Assessment Patient needs continued PT services  PT Problem List Decreased strength;Decreased activity tolerance;Decreased balance;Decreased mobility          PT Treatment Interventions DME instruction;Gait training;Functional mobility training;Therapeutic activities;Therapeutic exercise;Balance training;Patient/family  education    PT Goals (Current goals can be found in the Care Plan section)  Acute Rehab PT Goals Patient Stated Goal: To improve his mobility.  PT Goal Formulation: With patient Time For Goal Achievement: 07/05/16 Potential to Achieve Goals: Good    Frequency Min 2X/week   Barriers to discharge Decreased caregiver support      Co-evaluation               End of Session Equipment Utilized During Treatment: Gait belt Activity Tolerance: Patient limited by fatigue Patient left: in chair;with call bell/phone within reach;with chair alarm set;with family/visitor present Nurse Communication: Mobility status         Time: 1350-1407 PT Time Calculation (min) (ACUTE ONLY): 17 min   Charges:   PT Evaluation $PT Re-evaluation: 1 Procedure     PT G Codes:       Kerman Passey, PT, DPT    06/21/2016, 5:03 PM

## 2016-06-22 LAB — CBC
HCT: 22.6 % — ABNORMAL LOW (ref 40.0–52.0)
Hemoglobin: 7.5 g/dL — ABNORMAL LOW (ref 13.0–18.0)
MCH: 31.3 pg (ref 26.0–34.0)
MCHC: 33 g/dL (ref 32.0–36.0)
MCV: 94.7 fL (ref 80.0–100.0)
PLATELETS: 199 10*3/uL (ref 150–440)
RBC: 2.38 MIL/uL — ABNORMAL LOW (ref 4.40–5.90)
RDW: 16.1 % — AB (ref 11.5–14.5)
WBC: 12 10*3/uL — AB (ref 3.8–10.6)

## 2016-06-22 LAB — COMP PANEL: LEUKEMIA/LYMPHOMA

## 2016-06-22 MED ORDER — TRAMADOL HCL 50 MG PO TABS: 50.0000 mg | ORAL_TABLET | Freq: Two times a day (BID) | ORAL | 0 refills | Status: DC | PRN

## 2016-06-22 MED ORDER — AMOXICILLIN-POT CLAVULANATE 875-125 MG PO TABS: 1.0000 | ORAL_TABLET | Freq: Two times a day (BID) | ORAL | 0 refills | Status: DC

## 2016-06-22 MED ORDER — SENNOSIDES-DOCUSATE SODIUM 8.6-50 MG PO TABS: 1.0000 | ORAL_TABLET | Freq: Every evening | ORAL | 0 refills | Status: DC | PRN

## 2016-06-22 MED ORDER — PANTOPRAZOLE SODIUM 40 MG PO TBEC: 40.0000 mg | DELAYED_RELEASE_TABLET | Freq: Every day | ORAL | 0 refills | Status: DC

## 2016-06-22 MED ORDER — SUCRALFATE 1 G PO TABS: 1.0000 g | ORAL_TABLET | Freq: Three times a day (TID) | ORAL | 0 refills | Status: DC

## 2016-06-22 NOTE — Progress Notes (Signed)
EMS called for transport. Malaia Buchta S, RN  

## 2016-06-22 NOTE — Progress Notes (Signed)
Patient discharged via EMS. Mollee Neer S, RN  

## 2016-06-22 NOTE — Progress Notes (Signed)
Clinical Social Worker (CSW) contacted patient's daughter Vaughan Basta to present bed offers. Daughter chose [facility]. Patient is medically stable for D/C to [facility] today. Per [facility] admissions coordinator at [facility] patient will go to room 701. RN will call report and arrange EMS for transport. CSW sent D/C orders to [facility] via [place]. Please reconsult if future social work needs arise. CSW signing off.   [employer], LCSW [phone]

## 2016-06-22 NOTE — Clinical Social Work Placement (Signed)
   CLINICAL SOCIAL WORK PLACEMENT  NOTE  Date:  06/22/2016  Patient Details  Name: Victor Parks MRN: [id number] Date of Birth: May 27, 1924  Clinical Social Work is seeking post-discharge placement for this patient at the [place] level of care (*CSW will initial, date and re-position this form in  chart as items are completed):  Yes   Patient/family provided with [facility] Work Department's list of facilities offering this level of care within the geographic area requested by the patient (or if unable, by the patient's family).  Yes   Patient/family informed of their freedom to choose among providers that offer the needed level of care, that participate in Medicare, Medicaid or managed care program needed by the patient, have an available bed and are willing to accept the patient.  Yes   Patient/family informed of [facility]'s ownership interest in [facility] and [facility], as well as of the fact that they are under no obligation to receive care at these facilities.  PASRR submitted to EDS on 06/20/16     PASRR number received on 06/20/16     Existing PASRR number confirmed on       FL2 transmitted to all facilities in geographic area requested by pt/family on 06/20/16     FL2 transmitted to all facilities within larger geographic area on       Patient informed that his/her managed care company has contracts with or will negotiate with certain facilities, including the following:        Yes   Patient/family informed of bed offers received.  Patient chooses bed at  [facility] )     Physician recommends and patient chooses bed at      Patient to be transferred to  [facility] ) on 06/22/16.  Patient to be transferred to facility by  [facility] )     Patient family notified on 06/22/16 of transfer.  Name of family member notified:   (Patient's daughter Vaughan Basta is aware of D/C today. )     PHYSICIAN        Additional Comment:    _______________________________________________ Jatavion Peaster, Veronia Beets, LCSW 06/22/2016, 11:54 AM

## 2016-06-22 NOTE — Plan of Care (Signed)
Problem: Health Behavior/Discharge Planning: Goal: Ability to manage health-related needs will improve Outcome: Progressing Discharge order has been placed for today.   Problem: Nutrition: Goal: Adequate nutrition will be maintained Outcome: Not Progressing Patient refused to eat breakfast.

## 2016-06-22 NOTE — Care Management Important Message (Signed)
Important Message  Patient Details  Name: Victor Parks MRN: [id number] Date of Birth: 1923-09-26   Medicare Important Message Given:  Yes    Shelbie Ammons, RN 06/22/2016, 8:32 AM

## 2016-06-22 NOTE — Progress Notes (Signed)
Pharmacy Antibiotic Note - Day 7  Victor Parks is a 82 y.o. male admitted on 06/13/2016 with sepsis from possible GI source. Pharmacy consulted for Zosyn dosing.    Plan: Continue Zosyn EI 3.375g IV Q8hr for 10 days.   Height: 5\' 6"  (167.6 cm) Weight: 202 lb (91.6 kg) IBW/kg (Calculated) : 63.8  Temp (24hrs), Avg:97.9 F (36.6 C), Min:97.4 F (36.3 C), Max:98.5 F (36.9 C)   Recent Labs Lab 06/16/16 1656 06/16/16 2017  06/16/16 2348 06/17/16 0809 06/17/16 1225 06/18/16 0828 06/19/16 0946 06/20/16 1030 06/21/16 0716  WBC 45.1*  --   --  40.8* 37.4* 37.8* 28.1* 21.0* 18.5* 15.2*  CREATININE  --   --   < > 1.22 1.13  --  0.87 0.90 0.84  --   LATICACIDVEN 6.1* 5.1*  --  4.8*  --  3.1* 1.5  --   --   --   < > = values in this interval not displayed.  Estimated Creatinine Clearance: 63.2 mL/min (by C-G formula based on SCr of 0.84 mg/dL).    Allergies  Allergen Reactions  . Ciprofloxacin Anaphylaxis    Antimicrobials this admission: Azithromycin  11/15 >> 11/18 Zosyn 11/17 >>  Vancomycin 11/17 >>11/20  Microbiology results: 11/17 BCx: NGTD x2, x1 11/17 UCx: >100k E faecalis (amp sens), >100k citrobacter freundii 11/18 MRSA PCR: neg  Pharmacy will continue to monitor and adjust per consult.   Madellyn Denio L 06/22/2016 8:43 AM

## 2016-06-22 NOTE — Progress Notes (Signed)
Report called to [facility]. Awaiting transportation. Madlyn Frankel, RN

## 2016-06-22 NOTE — Discharge Summary (Signed)
[place] [facility] [place] NAME: Victor Parks    MR#:  [id number]  DATE OF BIRTH:  Mar 12, 1924  DATE OF ADMISSION:  06/13/2016 ADMITTING PHYSICIAN: Saundra Shelling, MD  DATE OF DISCHARGE: 06/22/2016  PRIMARY CARE PHYSICIAN: PATERSON,ROBERT, MD    ADMISSION DIAGNOSIS:  Acute bronchitis, unspecified organism [J20.9]  DISCHARGE DIAGNOSIS:  Principal Problem:   Dyspnea Active Problems:   Pressure injury of skin   Protein-calorie malnutrition, severe   Abdominal pain   Septic shock (HCC)   Severe sepsis with septic shock (HCC)   Acute blood loss anemia   SECONDARY DIAGNOSIS:   Past Medical History:  Diagnosis Date  . Bladder cancer ([place])   . Decubitus ulcer   . Hypertension   . Muscle weakness   . UTI (urinary tract infection)     HOSPITAL COURSE:   82 year old male with a history of bladder cancer, essential hypertension admitted for abdominal pain and acute kidney injury  1. Sepsis from translocation of bacteria across bowel wall given severe impaction and urine culture positive for enterococcus and Citrobacter He was on Zosyn while in the hospital and as per ID consult will be discharged on Augmentin to complete 10 day course 2. Intermittent abdominal pain which is resolved due to constipation CT on admission showed no acute etiology  3. Acute on chronic anemia status post 2 unit PRBC EGD on November 22 shows Long segment in the esophagus 11 cm suspicious for Barrettes. Has a large hiatal hernia. He had a long deep ulcer with a clean base within the segment of suspected Barrettes. He may have bled from large ulcer and esophagitis Continue PPI daily and Carafate for 6 weeks Repeat EGD in 6 weeks to check for healing Hemoglobin stable at 7.5 this am   4. Acute kidney injury which has resolved and was from prerenal azotemia  5. Stage II sacral ulcer present on admission: 82 year old male with a history of bladder cancer,  essential hypertension admitted for abdominal pain and acute kidney injury  1. Sepsis from translocation of bacteria across bowel wall given severe impaction: With urine culture positive for enterococcus and Citrobacter Appreciate ID consult Patient will continue on Zosyn and I discharged changed to oral Augmentin  2. Intermittent abdominal pain which is resolved due to constipation CT on admission showed no acute etiology  3. Acute on chronic anemia status post 2 unit PRBC EGD on November 22 shows Long segment in the esophagus 11 cm suspicious for Barrettes. Has a large hiatal hernia. He had a long deep ulcer with a clean base within the segment of suspected Barrettes. He may have bled from large ulcer and esophagitis Continue PPI daily and Carafate for 6 weeks Repeat EGD in 6 weeks to check for healing Follow hemoglobin   4. Acute kidney injury which is resolved  5. Stage II sacral ulcer present on admission: Cleanse buttocks with soap and water daily and PRN soilage. No disposable briefs or underpads. Barrier cream twice daily.    5. Stage II sacral ulcer present on admission: Cleanse buttocks with soap and water daily and PRN soilage. No disposable briefs or underpads. Barrier cream twice daily.   6. Acute encephalopathy resolved  7. Right upper extremity swelling with superficial thrombophlebitis no DVT on venous Dopplers   DISCHARGE CONDITIONS AND DIET:   Stable Heart healthy diet  CONSULTS OBTAINED:  Treatment Team:  Jules Husbands, MD Kristian Covey, FNP Thayer Headings, MD [name], MD Christia Reading  Bridget Hartshorn, MD Creola Corn, MD Leonel Ramsay, MD  DRUG ALLERGIES:   Allergies  Allergen Reactions  . Ciprofloxacin Anaphylaxis    DISCHARGE MEDICATIONS:   Current Discharge Medication List    START taking these medications   Details  pantoprazole (PROTONIX) 40 MG tablet Take 1 tablet (40 mg total) by mouth daily. Qty: 30 tablet,  Refills: 0    senna-docusate (SENOKOT-S) 8.6-50 MG tablet Take 1 tablet by mouth at bedtime as needed for mild constipation. Qty: 30 tablet, Refills: 0    sucralfate (CARAFATE) 1 g tablet Take 1 tablet (1 g total) by mouth 4 (four) times daily -  with meals and at bedtime. Qty: 120 tablet, Refills: 0      CONTINUE these medications which have CHANGED   Details  traMADol (ULTRAM) 50 MG tablet Take 1 tablet (50 mg total) by mouth every 12 (twelve) hours as needed. Qty: 30 tablet, Refills: 0      CONTINUE these medications which have NOT CHANGED   Details  acetaminophen (TYLENOL) 325 MG tablet Take 650 mg by mouth every 6 (six) hours as needed.    Ascorbic Acid (VITAMIN C) 1000 MG tablet Take 1,000 mg by mouth daily.    aspirin EC 81 MG tablet Take 81 mg by mouth once. tuesday    Calcium Carb-Cholecalciferol (CALCIUM 1000 + D) 1000-800 MG-UNIT TABS Take 1 tablet by mouth daily.    cholestyramine light (PREVALITE) 4 g packet Take 4 g by mouth daily.    Fluticasone-Salmeterol (ADVAIR) 250-50 MCG/DOSE AEPB Inhale 1 puff into the lungs 2 (two) times daily.    lactose free nutrition (BOOST) LIQD Take 1 Container by mouth daily.    loperamide (IMODIUM A-D) 2 MG tablet Take 2 mg by mouth every 6 (six) hours as needed for diarrhea or loose stools.    Menthol-Zinc Oxide (CALMOSEPTINE) 0.44-20.6 % OINT Apply 1 application topically every 12 (twelve) hours as needed. To buttocks for rash    Multiple Vitamin (DAILY VITE) TABS Take 1 tablet by mouth daily.    potassium citrate (UROCIT-K) 10 MEQ (1080 MG) SR tablet Take 10 mEq by mouth 2 (two) times daily.    simethicone (MYLICON) 80 MG chewable tablet Chew 80 mg by mouth every 6 (six) hours as needed for flatulence.    vitamin B-12 (CYANOCOBALAMIN) 1000 MCG tablet Take 1,000 mcg by mouth daily.              Today   CHIEF COMPLAINT:  Doing well this am no SOB had some old blood in stool last night No abdominal pain  VITAL  SIGNS:  Blood pressure (!) 102/52, pulse 71, temperature 98.5 F (36.9 C), temperature source Oral, resp. rate 20, height 5\' 6"  (1.676 m), weight 91.6 kg (202 lb), SpO2 97 %.   REVIEW OF SYSTEMS:  Review of Systems  Constitutional: Negative.  Negative for chills, fever and malaise/fatigue.  HENT: Negative.  Negative for ear discharge, ear pain, hearing loss, nosebleeds and sore throat.   Eyes: Negative.  Negative for blurred vision and pain.  Respiratory: Negative.  Negative for cough, hemoptysis, shortness of breath and wheezing.   Cardiovascular: Negative.  Negative for chest pain, palpitations and leg swelling.  Gastrointestinal: Negative.  Negative for abdominal pain, blood in stool, diarrhea, nausea and vomiting.  Genitourinary: Negative.  Negative for dysuria.  Musculoskeletal: Negative.  Negative for back pain.  Skin: Negative.   Neurological: Negative for dizziness, tremors, speech change, focal weakness, seizures and headaches.  Endo/Heme/Allergies: Negative.  Does not bruise/bleed easily.  Psychiatric/Behavioral: Negative.  Negative for depression, hallucinations and suicidal ideas.     PHYSICAL EXAMINATION:  GENERAL:  82 y.o.-year-old patient lying in the bed with no acute distress.  NECK:  Supple, no jugular venous distention. No thyroid enlargement, no tenderness.  LUNGS: Normal breath sounds bilaterally, no wheezing, rales,rhonchi  No use of accessory muscles of respiration.  CARDIOVASCULAR: S1, S2 normal. No murmurs, rubs, or gallops.  ABDOMEN: Soft, non-tender, non-distended. Bowel sounds present. No organomegaly or mass. urosomy bag EXTREMITIES: No pedal edema, cyanosis, or clubbing.  PSYCHIATRIC: The patient is alert and oriented x 3.  SKIN: No obvious rash, lesion, ++sacral stage 2 ulcer.   DATA REVIEW:   CBC  Recent Labs Lab 06/21/16 0716  WBC 15.2*  HGB 7.5*  HCT 22.1*  PLT 173    Chemistries   Recent Labs Lab 06/16/16 2024  06/19/16 0946  06/20/16 1030  NA 136  < > 138 137  K 4.1  < > 3.1* 4.0  CL 108  < > 108 105  CO2 19*  < > 26 27  GLUCOSE 177*  < > 104* 110*  BUN 99*  < > 20 14  CREATININE 1.41*  < > 0.90 0.84  CALCIUM 8.0*  < > 7.1* 7.6*  MG 2.2  --   --   --   AST  --   < > 19  --   ALT  --   < > 14*  --   ALKPHOS  --   < > 45  --   BILITOT  --   < > 0.6  --   < > = values in this interval not displayed.  Cardiac Enzymes  Recent Labs Lab 06/16/16 2024 06/17/16 0809  TROPONINI <0.03 <0.03    Microbiology Results  @[email or web address] @  RADIOLOGY:  No results found.    Management plans discussed with the patient and he is in agreement. Stable for discharge SNF  Patient should follow up with pcp and GI  CODE STATUS:     Code Status Orders        Start     Ordered   06/13/16 0532  Do not attempt resuscitation (DNR)  Continuous    Question Answer Comment  In the event of cardiac or respiratory ARREST Do not call a "code blue"   In the event of cardiac or respiratory ARREST Do not perform Intubation, CPR, defibrillation or ACLS   In the event of cardiac or respiratory ARREST Use medication by any route, position, wound care, and other measures to relive pain and suffering. May use oxygen, suction and manual treatment of airway obstruction as needed for comfort.      06/13/16 0531    Code Status History    Date Active Date Inactive Code Status Order ID Comments User Context   05/29/2016  8:18 AM 05/29/2016  5:01 PM DNR [id number]  Merlyn Lot, MD ED    Advance Directive Documentation   Flowsheet Row Most Recent Value  Type of Advance Directive  Living will  Pre-existing out of facility DNR order (yellow form or pink MOST form)  Yellow form placed in chart (order not valid for inpatient use)  "MOST" Form in Place?  No data      TOTAL TIME TAKING CARE OF THIS PATIENT: 36 minutes.    Note: This dictation was prepared with Dragon dictation along with smaller phrase technology. Any  transcriptional errors that result from this  process are unintentional.  Jerren Flinchbaugh M.D on 06/22/2016 at 8:51 AM  Between 7am to 6pm - Pager - [phone] After 6pm go to [email or web address] - password [email or web address]  Office  [phone]  CC: Primary care physician; [name], MD

## 2016-06-23 LAB — SURGICAL PATHOLOGY

## 2016-06-25 ENCOUNTER — Encounter: Payer: Self-pay | Admitting: Gastroenterology

## 2016-06-25 ENCOUNTER — Non-Acute Institutional Stay (SKILLED_NURSING_FACILITY): Payer: Medicare Other | Admitting: Internal Medicine

## 2016-06-25 DIAGNOSIS — L89152 Pressure ulcer of sacral region, stage 2: Secondary | ICD-10-CM | POA: Diagnosis not present

## 2016-06-25 DIAGNOSIS — R531 Weakness: Secondary | ICD-10-CM | POA: Diagnosis not present

## 2016-06-25 DIAGNOSIS — L98429 Non-pressure chronic ulcer of back with unspecified severity: Secondary | ICD-10-CM

## 2016-06-25 DIAGNOSIS — Z8551 Personal history of malignant neoplasm of bladder: Secondary | ICD-10-CM | POA: Diagnosis not present

## 2016-06-25 DIAGNOSIS — D62 Acute posthemorrhagic anemia: Secondary | ICD-10-CM

## 2016-06-25 DIAGNOSIS — N3000 Acute cystitis without hematuria: Secondary | ICD-10-CM

## 2016-06-25 DIAGNOSIS — K922 Gastrointestinal hemorrhage, unspecified: Secondary | ICD-10-CM | POA: Diagnosis not present

## 2016-06-25 DIAGNOSIS — E440 Moderate protein-calorie malnutrition: Secondary | ICD-10-CM

## 2016-06-25 DIAGNOSIS — K209 Esophagitis, unspecified without bleeding: Secondary | ICD-10-CM

## 2016-06-25 DIAGNOSIS — D72828 Other elevated white blood cell count: Secondary | ICD-10-CM

## 2016-06-25 DIAGNOSIS — I10 Essential (primary) hypertension: Secondary | ICD-10-CM

## 2016-06-25 LAB — BASIC METABOLIC PANEL
BUN: 20 mg/dL (ref 4–21)
Creatinine: 0.9 mg/dL (ref 0.6–1.3)
Glucose: 101 mg/dL
POTASSIUM: 3.9 mmol/L (ref 3.4–5.3)
SODIUM: 138 mmol/L (ref 137–147)

## 2016-06-25 LAB — CBC AND DIFFERENTIAL
HEMATOCRIT: 25 % — AB (ref 41–53)
HEMOGLOBIN: 8.1 g/dL — AB (ref 13.5–17.5)
Platelets: 302 10*3/uL (ref 150–399)
WBC: 14.3 10^3/mL

## 2016-06-25 LAB — HEPATIC FUNCTION PANEL
ALK PHOS: 50 U/L (ref 25–125)
ALT: 11 U/L (ref 10–40)
AST: 9 U/L — AB (ref 14–40)
BILIRUBIN, TOTAL: 0.4 mg/dL

## 2016-06-25 NOTE — Progress Notes (Signed)
LOCATION: [facility]  PCP: [name], MD   Code Status: DNR  Goals of care: Advanced Directive information Advanced Directives 06/25/2016  Does Patient Have a Medical Advance Directive? Yes  Type of Advance Directive Out of facility DNR (pink MOST or yellow form)  Does patient want to make changes to medical advance directive? No - Patient declined  Would patient like information on creating a medical advance directive? -  Pre-existing out of facility DNR order (yellow form or pink MOST form) -       Extended Emergency Contact Information Primary Emergency Contact: [name] Address: [address] [place]          [place], [place] [address] [place] Phone: [phone] Work Phone: [phone] Mobile Phone: [phone] Relation: Daughter Secondary Emergency Contact: [name] Address: [address]          [place], [place] [address] [name] Phone: [phone] Mobile Phone: [phone] Relation: Daughter   Allergies  Allergen Reactions  . Ciprofloxacin Anaphylaxis    Chief Complaint  Patient presents with  . New Admit To SNF    New Admission Visit     HPI:  Patient is a 82 y.o. male seen today for short term rehabilitation post hospital admission from 06/13/16-06/22/16 with sepsis and bacteremia. He was treated with antibiotics and ID was on board. He had GI bleed with esophagitis confirmed on EGD and clean base ulcer. He was seen by GI team and placed on PPI and sucralfate. He received 2 u prbc transfusion. He has medical history of bladder cancer, HTN. He is seen in his room today with his wife at bedside.   Review of Systems:  Constitutional: Negative for fever, chills, diaphoresis.  HENT: Negative for headache, congestion, nasal discharge, difficulty swallowing.   Eyes: Negative for double vision and discharge.  Respiratory: Negative for cough, shortness of breath and wheezing.     Cardiovascular: Negative for chest pain, palpitations, leg swelling.  Gastrointestinal: Negative for heartburn, nausea, vomiting, abdominal pain. Last bowel movement was today. Genitourinary: s/p urostomy Musculoskeletal: Negative for back pain, fall in the facility.  Skin: Negative for itching, rash.  Neurological: Negative for dizziness. Psychiatric/Behavioral: Negative for depression.   Past Medical History:  Diagnosis Date  . Bladder cancer ([place])   . Decubitus ulcer   . Hypertension   . Muscle weakness   . UTI (urinary tract infection)    Past Surgical History:  Procedure Laterality Date  . ESOPHAGOGASTRODUODENOSCOPY (EGD) WITH PROPOFOL N/A 06/20/2016   Procedure: ESOPHAGOGASTRODUODENOSCOPY (EGD) WITH PROPOFOL;  Surgeon: Jonathon Bellows, MD;  Location: [facility] ENDOSCOPY;  Service: Endoscopy;  Laterality: N/A;  . ILEAL POUCH     s/p bladder cancer  . none     Social History:   reports that he has been smoking.  He has never used smokeless tobacco. He reports that he drinks alcohol. He reports that he does not use drugs.  No family history on file.  Medications:   Medication List       Accurate as of 06/25/16 11:48 AM. Always use your most recent med list.          acetaminophen 325 MG tablet Commonly known as:  TYLENOL Take 650 mg by mouth every 6 (six) hours as needed.   amoxicillin-clavulanate 875-125 MG tablet Commonly known as:  AUGMENTIN Take 1 tablet by mouth 2 (two) times daily.   aspirin EC  81 MG tablet Take 81 mg by mouth once. tuesday   CALCIUM 1000 + D 1000-800 MG-UNIT Tabs Generic drug:  Calcium Carb-Cholecalciferol Take 1 tablet by mouth daily.   CALMOSEPTINE 0.44-20.6 % Oint Generic drug:  Menthol-Zinc Oxide Apply 1 application topically every 12 (twelve) hours as needed. To buttocks for rash   cholestyramine light 4 g packet Commonly known as:  PREVALITE Take 4 g by mouth daily.   DAILY VITE Tabs Take 1 tablet by mouth daily.    Fluticasone-Salmeterol 250-50 MCG/DOSE Aepb Commonly known as:  ADVAIR Inhale 1 puff into the lungs 2 (two) times daily.   lactose free nutrition Liqd Take 1 Container by mouth daily.   loperamide 2 MG tablet Commonly known as:  IMODIUM A-D Take 2 mg by mouth every 6 (six) hours as needed for diarrhea or loose stools.   pantoprazole 40 MG tablet Commonly known as:  PROTONIX Take 1 tablet (40 mg total) by mouth daily.   potassium citrate 10 MEQ (1080 MG) SR tablet Commonly known as:  UROCIT-K Take 10 mEq by mouth 2 (two) times daily.   senna-docusate 8.6-50 MG tablet Commonly known as:  Senokot-S Take 1 tablet by mouth at bedtime as needed for mild constipation.   simethicone 80 MG chewable tablet Commonly known as:  MYLICON Chew 80 mg by mouth every 6 (six) hours as needed for flatulence.   sucralfate 1 g tablet Commonly known as:  CARAFATE Take 1 tablet (1 g total) by mouth 4 (four) times daily -  with meals and at bedtime.   traMADol 50 MG tablet Commonly known as:  ULTRAM Take 1 tablet (50 mg total) by mouth every 12 (twelve) hours as needed.   vitamin B-12 1000 MCG tablet Commonly known as:  CYANOCOBALAMIN Take 1,000 mcg by mouth daily.   vitamin C 1000 MG tablet Take 1,000 mg by mouth daily.       Immunizations:  There is no immunization history on file for this patient.   Physical Exam:  Vitals:   06/25/16 1141  BP: 134/83  Pulse: 84  Resp: 18  Temp: 97.8 F (36.6 C)  TempSrc: Oral  SpO2: 97%  Weight: 202 lb (91.6 kg)  Height: 5\' 6"  (1.676 m)   Body mass index is 32.6 kg/m.  General- elderly male, frail, in no acute distress Head- normocephalic, atraumatic Nose- no nasal discharge Throat- moist mucus membrane Eyes- PERRLA, EOMI, no pallor, no icterus Neck- no cervical lymphadenopathy Cardiovascular- normal s1,s2, no murmur, no leg edema Respiratory- bilateral clear to auscultation, no wheeze, no rhonchi, no crackles, no use of  accessory muscles Abdomen- bowel sounds present, soft, non tender, urostomy bag in place with clear urine Musculoskeletal- able to move all 4 extremities, generalized weakness, arthritis changes to his fingers Neurological- alert and oriented to person and place but not to time Skin- warm and dry, stage 2 pressure ulcer to sacrum Psychiatry- normal mood and affect    Labs reviewed: Basic Metabolic Panel:  Recent Labs  06/16/16 2024  06/18/16 0828 06/19/16 0946 06/20/16 1030  NA 136  < > 139 138 137  K 4.1  < > 3.0* 3.1* 4.0  CL 108  < > 113* 108 105  CO2 19*  < > 23 26 27   GLUCOSE 177*  < > 96 104* 110*  BUN 99*  < > 33* 20 14  CREATININE 1.41*  < > 0.87 0.90 0.84  CALCIUM 8.0*  < > 7.1* 7.1* 7.6*  MG 2.2  --   --   --   --  PHOS 2.7  --   --   --   --   < > = values in this interval not displayed. Liver Function Tests:  Recent Labs  06/17/16 0809 06/18/16 0828 06/19/16 0946  AST 26 24 19   ALT 13* 15* 14*  ALKPHOS 34* 37* 45  BILITOT 0.7 0.7 0.6  PROT 4.2* 4.2* 4.4*  ALBUMIN 2.4* 2.3* 2.3*   No results for input(s): LIPASE, AMYLASE in the last 8760 hours.  Recent Labs  06/16/16 2017  AMMONIA 20   CBC:  Recent Labs  06/19/16 0946  06/20/16 1030 06/21/16 0716 06/22/16 1031  WBC 21.0*  --  18.5* 15.2* 12.0*  NEUTROABS 17.3*  --  13.8* 11.5*  --   HGB 7.3*  < > 7.8* 7.5* 7.5*  HCT 21.5*  --  23.2* 22.1* 22.6*  MCV 92.2  --  93.5 93.1 94.7  PLT 133*  --  151 173 199  < > = values in this interval not displayed. Cardiac Enzymes:  Recent Labs  06/13/16 0254 06/16/16 2024 06/17/16 0809  TROPONINI <0.03 <0.03 <0.03   BNP: Invalid input(s): POCBNP CBG:  Recent Labs  06/17/16 1154  GLUCAP 103*    Radiological Exams: Ct Abdomen Pelvis Wo Contrast  Result Date: 06/15/2016 CLINICAL DATA:  Abdominal pain.  Relieve by lying on the right side. EXAM: CT ABDOMEN AND PELVIS WITHOUT CONTRAST TECHNIQUE: Multidetector CT imaging of the abdomen and  pelvis was performed following the standard protocol without IV contrast. COMPARISON:  None. FINDINGS: Lower chest: Nodular airspace disease of the left lung base. Hepatobiliary: No focal liver abnormality is seen. No gallstones, gallbladder wall thickening, or biliary dilatation. Pancreas: Unremarkable. No pancreatic ductal dilatation or surrounding inflammatory changes. Spleen: Normal in size without focal abnormality. Adrenals/Urinary Tract: Normal adrenal glands. Nonobstructing right renal calculus. Kidneys otherwise normal. No obstructive uropathy. Prior cystectomy with a right lower quadrant urostomy. Stomach/Bowel: No bowel wall thickening or bowel dilatation. No pneumatosis, pneumoperitoneum or portal venous gas. Rectal fecal impaction. Small hiatal hernia. Vascular/Lymphatic: Normal caliber abdominal aorta with atherosclerosis. No lymphadenopathy. Reproductive: Prior prostatectomy with surgical clips along the pelvic sidewalls. Other: No fluid collection or hematoma.  No abdominal wall hernia. Musculoskeletal: Chronic L1 vertebral body compression fracture. Mild chronic L3 vertebral body compression fracture. Degenerative disc disease with disc height loss at L3-4, L4-5 and L5-S1 with bilateral facet arthropathy. ORIF baby right intertrochanteric fracture. IMPRESSION: 1. No acute abdominal or pelvic pathology. 2. Prior cystectomy with in right lower quadrant urostomy. 3. Nonobstructing right renal calculus. Electronically Signed   By: Kathreen Devoid   On: 06/15/2016 16:39   Dg Chest 1 View  Result Date: 06/13/2016 CLINICAL DATA:  Acute onset dyspnea tonight. EXAM: CHEST 1 VIEW COMPARISON:  05/29/2016 FINDINGS: There is stable mild left hemidiaphragm elevation and minimal linear basilar scarring or atelectasis. No confluent airspace consolidation. No effusions. Normal pulmonary vasculature. Hilar, mediastinal and cardiac contours are unremarkable and unchanged. IMPRESSION: No active disease.  Electronically Signed   By: Andreas Newport M.D.   On: 06/13/2016 03:14   Dg Abd 1 View  Result Date: 06/16/2016 CLINICAL DATA:  Pain, inability to give further history EXAM: ABDOMEN - 1 VIEW COMPARISON:  CT abdomen and pelvis performed 06/15/2016. FINDINGS: No bowel obstruction is evident. Enteric contrast fills the colon. Within limits for assessment on portable supine radiograph, no visible free air. Prior cystectomy for bladder cancer with RIGHT lower quadrant urostomy. Osteopenia. RIGHT femoral neck ORIF prosthesis, stable. IMPRESSION: No acute findings. Electronically  Signed   By: Staci Righter M.D.   On: 06/16/2016 20:55   Ct Abdomen Pelvis W Contrast  Result Date: 05/29/2016 CLINICAL DATA:  RIGHT chest, RIGHT upper quadrant, epigastric abdominal pain/ discomfort beginning last night, history of bladder cancer post cystectomy and urostomy, leukocytosis, anemia EXAM: CT ABDOMEN AND PELVIS WITH CONTRAST TECHNIQUE: Multidetector CT imaging of the abdomen and pelvis was performed using the standard protocol following bolus administration of intravenous contrast. Sagittal and coronal MPR images reconstructed from axial data set. CONTRAST:  23mL ISOVUE-300 IOPAMIDOL (ISOVUE-300) INJECTION 61% IV. Dilute oral contrast. COMPARISON:  None FINDINGS: Lower chest: Bibasilar emphysematous changes Hepatobiliary: Contracted gallbladder. Nonspecific 5 mm low-attenuation lesion RIGHT lobe liver image 22. Remainder of liver unremarkable. Pancreas: Normal appearance Spleen: Normal appearance Adrenals/Urinary Tract: Small BILATERAL intermediate attenuation renal lesions as well as a small probable RIGHT renal cyst. Unremarkable adrenal glands. Fullness of BILATERAL renal collecting systems and ureters extending to RIGHT lower quadrant urostomy which abuts and indents the RIGHT colon. Post cystectomy. Stomach/Bowel: Nonvisualization of appendix. Small bowel anastomosis in RIGHT mid abdomen without bowel obstruction.  Increased stool in rectum. Distended stomach. Remaining bowel loops unremarkable. Vascular/Lymphatic: Atherosclerotic calcifications aorta and iliac arteries. Plaque at origins of the celiac artery and SMA with less than 50% diameter narrowing. No adenopathy. Reproductive: N/A Other: No free air or free fluid. Musculoskeletal: Diffuse osseous demineralization. Orthopedic hardware proximal RIGHT femur. Compression fractures of the superior endplates of L1 and L3, appear old. Multilevel disc space narrowing and endplate spur formation with vacuum phenomenon. RIGHT paracentral disc protrusion L5-S1 likely exerting mass effect upon the RIGHT S1 root. RIGHT neural foraminal stenosis L3-L4 with milder degrees of neural foraminal narrowing at additional levels bilaterally. IMPRESSION: RIGHT lower quadrant urostomy without acute abnormality. Increased stool in rectum. Nonspecific small lesions within the liver and kidneys ; if patient has any prior outside exams these would be of benefit in establishing stability. In the absence of prior exams either MR characterization or followup imaging to assess stability would be recommended. Question COPD. Aortic atherosclerosis. No definite acute intra-abdominal or intrapelvic abnormalities. Significant degenerative disc disease changes of the lumbar spine as above with compression fractures of L1 and L3 as well as RIGHT paracentral disc herniation at L5-S1 likely exerting mass effect upon the RIGHT S1 root. Electronically Signed   By: Lavonia Dana M.D.   On: 05/29/2016 08:05   US Venous Img Upper Uni Right  Result Date: 06/20/2016 CLINICAL DATA:  Both right upper extremity edema. History of bladder cancer and smoking. Evaluate for DVT. EXAM: RIGHT UPPER EXTREMITY VENOUS DOPPLER ULTRASOUND TECHNIQUE: Gray-scale sonography with graded compression, as well as color Doppler and duplex ultrasound were performed to evaluate the upper extremity deep venous system from the level of the  subclavian vein and including the jugular, axillary, basilic, radial, ulnar and upper cephalic vein. Spectral Doppler was utilized to evaluate flow at rest and with distal augmentation maneuvers. COMPARISON:  None. FINDINGS: Contralateral Subclavian Vein: Respiratory phasicity is normal and symmetric with the symptomatic side. No evidence of thrombus. Normal compressibility. Internal Jugular Vein: No evidence of thrombus. Normal compressibility, respiratory phasicity and response to augmentation. Subclavian Vein: No evidence of thrombus. Normal compressibility, respiratory phasicity and response to augmentation. Axillary Vein: No evidence of thrombus. Normal compressibility, respiratory phasicity and response to augmentation. Cephalic Vein: There is hypoechoic occlusive thrombus seen throughout the proximal (image 15) mid (image 16) and distal (image 17) aspects of the right cephalic vein. There is no definitive extension  of this occlusive superficial thrombophlebitis to the deep venous system of the right upper extremity. New Basilic Vein: No evidence of thrombus. Normal compressibility, respiratory phasicity and response to augmentation. Brachial Veins: No evidence of thrombus. Normal compressibility, respiratory phasicity and response to augmentation. Radial Veins: No evidence of thrombus. Normal compressibility, respiratory phasicity and response to augmentation. Ulnar Veins: No evidence of thrombus. Normal compressibility, respiratory phasicity and response to augmentation. Venous Reflux:  None visualized. Other Findings: There is a moderate amount of subcutaneous edema noted at the patient's area of swelling within in the right antecubital fossa (images 35 and 36) IMPRESSION: 1. No evidence DVT within right upper extremity. 2. Examination positive for occlusive superficial thrombophlebitis seen throughout the imaged course of the right cephalic vein. 3. Moderate amount of subcutaneous edema at the level of the  antecubital fossa correlating with the patient's area of swelling. Electronically Signed   By: Sandi Mariscal M.D.   On: 06/20/2016 08:17   Dg Chest Port 1 View  Result Date: 06/16/2016 CLINICAL DATA:  Pain.  Hypertension EXAM: PORTABLE CHEST 1 VIEW COMPARISON:  June 13, 2016 FINDINGS: There is no edema or consolidation. Heart size and pulmonary vascularity are normal. No adenopathy. There is atherosclerotic calcification aorta. No bone lesions. IMPRESSION: Aortic atherosclerosis.  No edema or consolidation. Electronically Signed   By: Lowella Grip III M.D.   On: 06/16/2016 20:57   Dg Chest Portable 1 View  Result Date: 05/29/2016 CLINICAL DATA:  Right-sided chest pain, onset last night. EXAM: PORTABLE CHEST 1 VIEW COMPARISON:  None. FINDINGS: A single AP portable view of the chest demonstrates no focal airspace consolidation or alveolar edema. The lungs are grossly clear. There is no large effusion or pneumothorax. Cardiac and mediastinal contours appear unremarkable. IMPRESSION: No active disease. Electronically Signed   By: Andreas Newport M.D.   On: 05/29/2016 06:03   Dg Abd 2 Views  Result Date: 06/14/2016 CLINICAL DATA:  Cough, abdominal pain EXAM: ABDOMEN - 2 VIEW COMPARISON:  Abdominal and pelvic CT scan of May 29, 2016 FINDINGS: The colonic stool burden is moderate. There are loops of mildly distended small bowel in the midline and to the left of midline. There is distention of the stomach with fluid and gas. No free extraluminal gas collections are observed. The lung bases are clear. There are numerous surgical clips in the pelvis. The bones are osteopenic and there degenerative changes of the lumbar spine with partial compressions at L1 and L4. IMPRESSION: Findings consistent with a partial mid to distal small bowel obstruction or ileus. Moderate distention of the stomach with air in fluid may benefit from the nasogastric suction. Partial compressions of the bodies of L1 and L4.  The L1 compression is old. The L4 compression appears new since May 29, 2016. Electronically Signed   By: David  Martinique M.D.   On: 06/14/2016 07:55   Ct Angio Abd/pel W/ And/or W/o  Result Date: 06/17/2016 CLINICAL DATA:  Hypotensive and tachycardia.  Worsening confusion. EXAM: CTA ABDOMEN AND PELVIS wITHOUT AND WITH CONTRAST TECHNIQUE: Multidetector CT imaging of the abdomen and pelvis was performed using the standard protocol during bolus administration of intravenous contrast. Multiplanar reconstructed images and MIPs were obtained and reviewed to evaluate the vascular anatomy. CONTRAST:  100 mL Isovue 370 intravenous COMPARISON:  06/15/2016 FINDINGS: VASCULAR Aorta: Normal caliber with moderate atherosclerotic calcification. No aneurysm. No dissection. No stenosis. Celiac: Patent without evidence of aneurysm, dissection, vasculitis or significant stenosis. SMA: Patent without evidence of aneurysm, dissection,  vasculitis or significant stenosis. Renals: Both renal arteries are patent without evidence of aneurysm, dissection, vasculitis, fibromuscular dysplasia or significant stenosis. Each kidney has an accessory renal artery, also patent. IMA: Patent without evidence of aneurysm, dissection, vasculitis or significant stenosis. Inflow: Patent without evidence of aneurysm, dissection, vasculitis or significant stenosis. Proximal Outflow: Bilateral common femoral and visualized portions of the superficial and profunda femoral arteries are patent without evidence of aneurysm, dissection, vasculitis or significant stenosis. Veins: No obvious venous abnormality. Review of the MIP images confirms the above findings. NON-VASCULAR Lower chest: No acute abnormality. Hepatobiliary: No focal liver abnormality is seen. No gallstones, gallbladder wall thickening, or biliary dilatation. Pancreas: Unremarkable. No pancreatic ductal dilatation or surrounding inflammatory changes. Spleen: Normal in size without focal  abnormality. Adrenals/Urinary Tract: Both adrenals are unremarkable. No suspicious renal parenchymal lesions. Ureters are unremarkable. Urostomy in the right lower quadrant, unremarkable. Prior cystectomy. Stomach/Bowel: Hiatal hernia. Stomach, small bowel and colon are otherwise remarkable only for large volume of stool distending the rectum, likely fecal impaction. Lymphatic: No pathologic adenopathy in the abdomen or pelvis. Reproductive: Unremarkable Other: No focal inflammatory changes are evident. No ascites. Small fat containing umbilical hernia. Musculoskeletal: Chronic unchanged compression of L1. Unchanged superior endplate impaction of L3. No significant skeletal lesions. IMPRESSION: VASCULAR Atherosclerotic calcification. No evidence of aneurysm, stenosis, dissection. NON-VASCULAR Hiatal hernia. Fecal impaction. No bowel obstruction or perforation. No evidence of bowel ischemia. Electronically Signed   By: Andreas Newport M.D.   On: 06/17/2016 03:32    Assessment/Plan  Generalized weakness Will have him work with physical therapy and occupational therapy team to help with gait training and muscle strengthening exercises.fall precautions. Skin care. Encourage to be out of bed.   GI bleed From esophagitis and ulcer. S/p 2 u prbc and is currently on PPI and carafate. Monitor clinically  Blood loss anemia From gi bleed. S/p 2 u prbc transfusion. Monitor cbc  Acute esophagitis Continue pantoprazole 40 mg daily and carafate 1 g tid with meals and at bedtime for now for 6 weeks. Gi follow up  Stage 2 pressure ulcer To provide wound care. Start decubivite to promote wound healing. Pressure ulcer prophylaxis. Currently on tylenol 650 mg q6h prn pain. Change this to 500 mg tid for pain. Continue tramadol 50 mg q12h prn pain.   UTI Continue and complete course of augmentin 875 mg q12h until 06/30/16. Hydration to be maintained.   Leukocytosis Currently on antibiotic for uti related sepsis.  Monitor wbc and temp curve  Acute encephalopathy With ongoing sepsis and deconditioning. Monitor wbc and temp curve. 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 Will need feeding supplement. Monitor weekly weight.   HTN Stable BP. Off antihypertensives. Continue aspirin ec 81 mg po daily  History of bladder cancer S/p urostomy, continue urostomy site care   Goals of care: short term rehabilitation   Labs/tests ordered: cbc, cmp 06/26/16   Family/ staff Communication: reviewed care plan with patient, his wife and nursing supervisor    Blanchie Serve, MD Internal Medicine [place], [place] [address] Cell Phone (Monday-Friday 8 am - 5 pm): [phone] On Call: [phone] and follow prompts after 5 pm and on weekends Office Phone: [phone] Office Fax: [phone]

## 2016-06-26 ENCOUNTER — Non-Acute Institutional Stay (SKILLED_NURSING_FACILITY): Payer: Medicare Other | Admitting: Family

## 2016-06-26 ENCOUNTER — Encounter: Payer: Self-pay | Admitting: Family

## 2016-06-26 DIAGNOSIS — D72829 Elevated white blood cell count, unspecified: Secondary | ICD-10-CM

## 2016-06-26 DIAGNOSIS — D509 Iron deficiency anemia, unspecified: Secondary | ICD-10-CM

## 2016-06-26 DIAGNOSIS — E440 Moderate protein-calorie malnutrition: Secondary | ICD-10-CM | POA: Diagnosis not present

## 2016-06-26 LAB — HEPATIC FUNCTION PANEL
ALT: 10 U/L (ref 10–40)
AST: 7 U/L — AB (ref 14–40)
Alkaline Phosphatase: 54 U/L (ref 25–125)
Bilirubin, Total: 0.4 mg/dL

## 2016-06-26 LAB — CBC AND DIFFERENTIAL
HEMATOCRIT: 24 % — AB (ref 41–53)
HEMOGLOBIN: 7.5 g/dL — AB (ref 13.5–17.5)
Neutrophils Absolute: 7 /uL
Platelets: 301 10*3/uL (ref 150–399)
WBC: 10.5 10^3/mL

## 2016-06-26 LAB — BASIC METABOLIC PANEL
BUN: 19 mg/dL (ref 4–21)
CREATININE: 0.9 mg/dL (ref 0.6–1.3)
GLUCOSE: 97 mg/dL
Potassium: 4 mmol/L (ref 3.4–5.3)
Sodium: 139 mmol/L (ref 137–147)

## 2016-06-26 MED ORDER — FERROUS SULFATE 325 (65 FE) MG PO TABS: 325.0000 mg | ORAL_TABLET | Freq: Every day | ORAL | Status: DC

## 2016-06-26 NOTE — Progress Notes (Addendum)
Location:  [facility] Room Number: [id number] Place of Service:  SNF (31) Provider: Deontre Allsup FNP-C   Victor Parks,ROBERT, MD  Patient Care Team: [name], MD as PCP - General (Internal Medicine)  Extended Emergency Contact Information Primary Emergency Contact: [name] Address: [address] [place]          [place], [place] [address] [name] of [place] Phone: [phone] Work Phone: [phone] Mobile Phone: [phone] Relation: Daughter Secondary Emergency Contact: [name] Address: [address]          [place], [place] [address] [name] of [place] Phone: [phone] Mobile Phone: [phone] Relation: Daughter  Code Status:  DNR  Goals of care: Advanced Directive information Advanced Directives 06/25/2016  Does Patient Have a Medical Advance Directive? Yes  Type of Advance Directive Out of facility DNR (pink MOST or yellow form)  Does patient want to make changes to medical advance directive? No - Patient declined  Would patient like information on creating a medical advance directive? -  Pre-existing out of facility DNR order (yellow form or pink MOST form) -     Chief Complaint  Patient presents with  . Acute Visit    Abnormal labs    HPI:  Pt is a 82 y.o. male seen today at [facility] for an acute visit for abnormal lab results. He is seen in his room with wife at bedside. He denies any acute issues this visit. His recent lab results showed Hgb 8.1, HCT 24.8, WBC 14.3, TP 5.0, Alb 2.76, Ca 8.0 ( 06/25/2016). He is current on Augmentin 875/125 mg Tablet twice daily until 06/30/2016 post hospital admission from 06/13/16-06/22/16 with sepsis and bacteremia. He was treated with Zosyn then discharge on Augmentin. He denies any fever, chills or cough. Facility staff reports no new concerns.     Past Medical History:  Diagnosis Date  . Bladder cancer ([facility])   .  Decubitus ulcer   . Hypertension   . Muscle weakness   . UTI (urinary tract infection)    Past Surgical History:  Procedure Laterality Date  . ESOPHAGOGASTRODUODENOSCOPY (EGD) WITH PROPOFOL N/A 06/20/2016   Procedure: ESOPHAGOGASTRODUODENOSCOPY (EGD) WITH PROPOFOL;  Surgeon: Jonathon Bellows, MD;  Location: [facility] ENDOSCOPY;  Service: Endoscopy;  Laterality: N/A;  . ILEAL POUCH     s/p bladder cancer  . none      Allergies  Allergen Reactions  . Ciprofloxacin Anaphylaxis      Medication List       Accurate as of 06/26/16  3:16 PM. Always use your most recent med list.          acetaminophen 500 MG tablet Commonly known as:  TYLENOL Take 500 mg by mouth every 8 (eight) hours as needed for mild pain.   acetaminophen 500 MG tablet Commonly known as:  TYLENOL Take 500 mg by mouth 3 (three) times daily. 6 am, 2 pm, 10 pm   amoxicillin-clavulanate 875-125 MG tablet Commonly known as:  AUGMENTIN Take 1 tablet by mouth 2 (two) times daily.   aspirin EC 81 MG tablet Take 81 mg by mouth once. tuesday   CALCIUM 1000 + D 1000-800 MG-UNIT Tabs Generic drug:  Calcium Carb-Cholecalciferol Take 1 tablet by mouth daily.   CALMOSEPTINE 0.44-20.6 % Oint Generic drug:  Menthol-Zinc Oxide Apply 1 application topically every 12 (twelve) hours as needed. To buttocks for rash  cholestyramine light 4 g packet Commonly known as:  PREVALITE Take 4 g by mouth daily.   DECUBI-VITE PO Take 1 tablet by mouth daily.   Fluticasone-Salmeterol 250-50 MCG/DOSE Aepb Commonly known as:  ADVAIR Inhale 1 puff into the lungs 2 (two) times daily.   lactose free nutrition Liqd Take 1 Container by mouth daily.   loperamide 2 MG tablet Commonly known as:  IMODIUM A-D Take 2 mg by mouth every 6 (six) hours as needed for diarrhea or loose stools.   pantoprazole 40 MG tablet Commonly known as:  PROTONIX Take 1 tablet (40 mg total) by mouth daily.   potassium citrate 10 MEQ (1080 MG) SR  tablet Commonly known as:  UROCIT-K Take 10 mEq by mouth 2 (two) times daily.   senna-docusate 8.6-50 MG tablet Commonly known as:  Senokot-S Take 1 tablet by mouth at bedtime as needed for mild constipation.   simethicone 80 MG chewable tablet Commonly known as:  MYLICON Chew 80 mg by mouth every 6 (six) hours as needed for flatulence.   sucralfate 1 g tablet Commonly known as:  CARAFATE Take 1 tablet (1 g total) by mouth 4 (four) times daily -  with meals and at bedtime.   traMADol 50 MG tablet Commonly known as:  ULTRAM Take 1 tablet (50 mg total) by mouth every 12 (twelve) hours as needed.   vitamin B-12 1000 MCG tablet Commonly known as:  CYANOCOBALAMIN Take 1,000 mcg by mouth daily.   vitamin C 1000 MG tablet Take 1,000 mg by mouth daily.       Review of Systems  Constitutional: Negative for activity change, appetite change, chills, fatigue and fever.  HENT: Negative for congestion, rhinorrhea, sinus pressure, sneezing and sore throat.   Eyes: Negative.   Respiratory: Negative for cough, chest tightness, shortness of breath and wheezing.   Cardiovascular: Negative for chest pain, palpitations and leg swelling.  Gastrointestinal: Negative for abdominal distention, abdominal pain, constipation, diarrhea, nausea and vomiting.  Genitourinary: Negative for dysuria, flank pain, frequency and urgency.  Musculoskeletal: Positive for gait problem.  Skin: Negative for color change, pallor and rash.       Sacral wound managed by wound care Nurse.   Neurological: Negative for dizziness, tremors, seizures, light-headedness and headaches.  Psychiatric/Behavioral: Negative for agitation, confusion, hallucinations and sleep disturbance. The patient is not nervous/anxious.      There is no immunization history on file for this patient. Pertinent  Health Maintenance Due  Topic Date Due  . PNA vac Low Risk Adult (1 of 2 - PCV13) 06/30/1991  . INFLUENZA VACCINE  02/28/2016   No  flowsheet data found. Functional Status Survey:    Vitals:   06/26/16 1056  BP: 105/60  Pulse: 83  Resp: 18  Temp: 99.6 F (37.6 C)  TempSrc: Oral  SpO2: 97%  Weight: 202 lb (91.6 kg)  Height: 5\' 6"  (1.676 m)   Body mass index is 32.6 kg/m. Physical Exam  Constitutional:  Thin Elder in no acute distress   HENT:  Head: Normocephalic.  Eyes: Conjunctivae and EOM are normal. Pupils are equal, round, and reactive to light. Right eye exhibits no discharge. Left eye exhibits no discharge. No scleral icterus.  Neck: Normal range of motion. No JVD present. No thyromegaly present.  Cardiovascular: Normal rate, regular rhythm, normal heart sounds and intact distal pulses.  Exam reveals no gallop and no friction rub.   No murmur heard. Pulmonary/Chest: Effort normal and breath sounds normal. No respiratory distress.  He has no wheezes. He has no rales.  Abdominal: Soft. Bowel sounds are normal. He exhibits no distension. There is no tenderness. There is no rebound and no guarding.  Musculoskeletal: He exhibits no edema, tenderness or deformity.  Moves x 4 extremities. Unsteady gait   Lymphadenopathy:    He has no cervical adenopathy.  Neurological: He is alert.  Skin: Skin is warm and dry. No rash noted. No erythema. No pallor.  Sacral drsg dry clean and intact. Surrounding skin without any signs of infections.   Psychiatric: He has a normal mood and affect.    Labs reviewed:  Recent Labs  06/16/16 2024  06/18/16 0828 06/19/16 0946 06/20/16 1030 06/25/16  NA 136  < > 139 138 137 138  K 4.1  < > 3.0* 3.1* 4.0 3.9  CL 108  < > 113* 108 105  --   CO2 19*  < > 23 26 27   --   GLUCOSE 177*  < > 96 104* 110*  --   BUN 99*  < > 33* 20 14 20   CREATININE 1.41*  < > 0.87 0.90 0.84 0.9  CALCIUM 8.0*  < > 7.1* 7.1* 7.6*  --   MG 2.2  --   --   --   --   --   PHOS 2.7  --   --   --   --   --   < > = values in this interval not displayed.  Recent Labs  06/17/16 0809 06/18/16 0828  06/19/16 0946 06/25/16  AST 26 24 19  9*  ALT 13* 15* 14* 11  ALKPHOS 34* 37* 45 50  BILITOT 0.7 0.7 0.6  --   PROT 4.2* 4.2* 4.4*  --   ALBUMIN 2.4* 2.3* 2.3*  --     Recent Labs  06/19/16 0946  06/20/16 1030 06/21/16 0716 06/22/16 1031 06/25/16  WBC 21.0*  --  18.5* 15.2* 12.0* 14.3  NEUTROABS 17.3*  --  13.8* 11.5*  --   --   HGB 7.3*  < > 7.8* 7.5* 7.5* 8.1*  HCT 21.5*  --  23.2* 22.1* 22.6* 25*  MCV 92.2  --  93.5 93.1 94.7  --   PLT 133*  --  151 173 199 302  < > = values in this interval not displayed.  Assessment/Plan 1. Leukocytosis, unspecified type  WBC 14.3 ( 06/25/2016).Afebrile. Continue on Augmentin 875/125 mg Tablet until  06/30/2016.  Will obtain urine specimen for U/A and C/S rule out UTI. Negative for cough bilateral Lung field CTA. Continue to monitor Temp curve. CBC/diff 07/02/2016.   2. Moderate protein malnutrition ([facility])  TP 5.0, Alb 2.76 ( 06/25/2016).RD consulted. Monitor BMP 07/02/2016   3. Iron deficiency anemia, unspecified iron deficiency anemia type Hgb 8.1, HCT 24.8 ( 06/25/2016) previous 7.5.Status post hospital admission 06/13/2016-06/22/2016 with sepsis and possible bleed from ulcer. Post PRBC transfusion.Add ferrous sulfate 325 mg Tablet daily with breakfast. Continue current stool softeners. CBC 07/02/2016.    4. Hypocalcemia Ca 8.0 ( 06/25/2016).continue with current supplement.Monitor BMP 07/02/2016      Family/ staff Communication: Reviewed plan of care with patient, Patient's wife and facility Nurse supervisor.   Labs/tests ordered: CBC/diff  07/02/2016.urine specimen for U/A and C/S rule out UTI.

## 2016-06-27 LAB — WBCS, STOOL: WBCS, STOOL: NONE SEEN

## 2016-06-29 ENCOUNTER — Telehealth: Payer: Self-pay

## 2016-06-29 NOTE — Telephone Encounter (Signed)
-----   Message from Jonathon Bellows, MD sent at 06/27/2016 10:24 AM EST ----- Victor Parks, This patient will need an appointment to see me to discuss further management options  . His son seems to be in charge of his care when I spoke to him .   I could not find a pcp to route these results to   Dr Jonathon Bellows  Gastroenterology/Hepatology Pager: [phone]

## 2016-06-29 NOTE — Telephone Encounter (Signed)
Dr. Vicente Males,  I spoke with pt's daughter, Vaughan Basta and she stated her father is in a home and its not easy getting him transported for appointments. She was wondering if her and her sister could come and discuss management options. She stated he is eating tums like crazy. Please advise if I can add them to your schedule and you discuss with them.

## 2016-07-02 LAB — BASIC METABOLIC PANEL
BUN: 20 mg/dL (ref 4–21)
Creatinine: 1 mg/dL (ref 0.6–1.3)
Glucose: 92 mg/dL
Potassium: 4 mmol/L (ref 3.4–5.3)
Sodium: 140 mmol/L (ref 137–147)

## 2016-07-02 LAB — CBC AND DIFFERENTIAL
HEMATOCRIT: 26 % — AB (ref 41–53)
HEMOGLOBIN: 8.2 g/dL — AB (ref 13.5–17.5)
PLATELETS: 301 10*3/uL (ref 150–399)
WBC: 8.5 10^3/mL

## 2016-07-04 NOTE — Telephone Encounter (Signed)
Yes, as long they have a power of attorney. Spoke with daughter, Vaughan Basta and she does have one. She will bring it with her to put on file for [place] for the appt on Monday. I advised her you will only talk with her if she has this.   This information was given to [place] by Dellie Burns from Risk management.

## 2016-07-04 NOTE — Telephone Encounter (Signed)
Can we do this legally ? Office visit without the patient?

## 2016-07-05 NOTE — Telephone Encounter (Signed)
Ok I can have a discussion with them at the office

## 2016-07-09 ENCOUNTER — Telehealth: Payer: Self-pay | Admitting: Gastroenterology

## 2016-07-09 ENCOUNTER — Ambulatory Visit: Payer: Medicare Other | Admitting: Gastroenterology

## 2016-07-09 NOTE — Telephone Encounter (Signed)
See message below °

## 2016-07-09 NOTE — Telephone Encounter (Signed)
Vaughan Basta, the patient's daughter and [place] is wondering if she can speak to Dr. Vicente Males in a phone call instead of having to come into the office. Dr. Georgeann Oppenheim next available appointment time (late afternoon due to the patient working in [place]) is on December 26th and she'd like to discuss her father's condition before then. Please give her a phone call.  Pt's ph# [phone]  Thank you.

## 2016-07-10 ENCOUNTER — Encounter: Payer: Self-pay | Admitting: Family

## 2016-07-10 ENCOUNTER — Non-Acute Institutional Stay (SKILLED_NURSING_FACILITY): Payer: Medicare Other | Admitting: Family

## 2016-07-10 DIAGNOSIS — R410 Disorientation, unspecified: Secondary | ICD-10-CM | POA: Diagnosis not present

## 2016-07-10 DIAGNOSIS — W19XXXA Unspecified fall, initial encounter: Secondary | ICD-10-CM

## 2016-07-10 DIAGNOSIS — Y92129 Unspecified place in nursing home as the place of occurrence of the external cause: Secondary | ICD-10-CM

## 2016-07-10 DIAGNOSIS — S51819A Laceration without foreign body of unspecified forearm, initial encounter: Secondary | ICD-10-CM | POA: Diagnosis not present

## 2016-07-10 NOTE — Telephone Encounter (Signed)
Vaughan Basta called back to see if Dr. Vicente Males would speak to her over the phone regarding Mr. Files. She said tomorrow the family will be going over his [place] at the Rehab facility to discuss his possible release. Depending on what information Dr. Vicente Males has for her, she's unsure that releasing her father to go back to the care of an Assisted Living facility is in his best interest. She'd like a phone call regarding this, especially since the meeting with the Rehab facility is tomorrow.  Linda's ph# [phone] Thank you.

## 2016-07-10 NOTE — Progress Notes (Signed)
Patient ID: Victor Parks, male   DOB: [birth year], 82 y.o.   MRN: [id number]

## 2016-07-10 NOTE — Progress Notes (Signed)
Patient ID: Victor Parks, male   DOB: [birth year], 82 y.o.   MRN: [id number]  Location:  [facility] Room Number: [id number]  Place of Service:  SNF (31) Provider:  Vennessa Affinito FNP-C   PATERSON,ROBERT, MD  Patient Care Team: [name], MD as PCP - General (Internal Medicine)  Extended Emergency Contact Information Primary Emergency Contact: [name] Address: [address] [place]          [place], [place] [address] [name] Phone: [phone] Work Phone: [phone] Mobile Phone: [phone] Relation: Daughter Secondary Emergency Contact: [name] Address: [address]          [place], [place] [address] [name] Phone: [phone] Mobile Phone: [phone] Relation: Daughter  Code Status: DNR  Goals of care: Advanced Directive information Advanced Directives 07/10/2016  Does Patient Have a Medical Advance Directive? Yes  Type of Advance Directive Out of facility DNR (pink MOST or yellow form)  Does patient want to make changes to medical advance directive? -  Would patient like information on creating a medical advance directive? -  Pre-existing out of facility DNR order (yellow form or pink MOST form) Yellow form placed in chart (order not valid for inpatient use)     Chief Complaint  Patient presents with  . Acute Visit    follow-up fall    HPI:  Pt is a 82 y.o. male seen today at [facility] for an acute visit for fall episode. He has a medical history of Anemia, GERD, Septic shock, unsteady gait among other conditions. He is seen in his room today. He denies any acute issues this visit though history limited due to altered mental status. He states does not recall falling off the bed. Facility Nurse reports patient's was found on the floor during the night. Left forearm skin tear noted. No other acute injuries. VSS. Of note he is status post oral  antibiotics 06/30/2016 due to UTI.    Past Medical History:  Diagnosis Date  . Bladder cancer ([place])   . Decubitus ulcer   . Hypertension   . Muscle weakness   . UTI (urinary tract infection)    Past Surgical History:  Procedure Laterality Date  . ESOPHAGOGASTRODUODENOSCOPY (EGD) WITH PROPOFOL N/A 06/20/2016   Procedure: ESOPHAGOGASTRODUODENOSCOPY (EGD) WITH PROPOFOL;  Surgeon: Jonathon Bellows, MD;  Location: [facility] ENDOSCOPY;  Service: Endoscopy;  Laterality: N/A;  . ILEAL POUCH     s/p bladder cancer  . none      Allergies  Allergen Reactions  . Ciprofloxacin Anaphylaxis      Medication List       Accurate as of 07/10/16 11:17 AM. Always use your most recent med list.          acetaminophen 500 MG tablet Commonly known as:  TYLENOL Take 500 mg by mouth 3 (three) times daily. 6 am, 2 pm, 10 pm   aspirin EC 81 MG tablet Take 81 mg by mouth once. tuesday   CALCIUM 1000 + D 1000-800 MG-UNIT Tabs Generic drug:  Calcium Carb-Cholecalciferol Take 1 tablet by mouth daily.   CALMOSEPTINE 0.44-20.6 % Oint Generic drug:  Menthol-Zinc Oxide Apply 1 application topically every 12 (twelve) hours as needed. To buttocks for rash   cholestyramine light 4 g packet Commonly known as:  PREVALITE Take 4 g by mouth daily.   ferrous sulfate 325 (65 FE)  MG tablet Take 325 mg by mouth daily with breakfast.   Fluticasone-Salmeterol 250-50 MCG/DOSE Aepb Commonly known as:  ADVAIR Inhale 1 puff into the lungs 2 (two) times daily.   loperamide 2 MG tablet Commonly known as:  IMODIUM A-D Take 2 mg by mouth every 6 (six) hours as needed for diarrhea or loose stools.   multivitamin tablet Take 1 tablet by mouth daily.   NUTRITIONAL DRINK Liqd Take 60 mLs by mouth 2 (two) times daily. MED PASS   pantoprazole 40 MG tablet Commonly known as:  PROTONIX Take 1 tablet (40 mg total) by mouth daily.   potassium citrate 10 MEQ (1080 MG) SR tablet Commonly known as:  UROCIT-K Take 10 mEq  by mouth 2 (two) times daily.   senna-docusate 8.6-50 MG tablet Commonly known as:  Senokot-S Take 1 tablet by mouth at bedtime as needed for mild constipation.   simethicone 80 MG chewable tablet Commonly known as:  MYLICON Chew 80 mg by mouth every 6 (six) hours as needed for flatulence.   sucralfate 1 g tablet Commonly known as:  CARAFATE Take 1 tablet (1 g total) by mouth 4 (four) times daily -  with meals and at bedtime.   traMADol 50 MG tablet Commonly known as:  ULTRAM Take 1 tablet (50 mg total) by mouth every 12 (twelve) hours as needed.   vitamin B-12 1000 MCG tablet Commonly known as:  CYANOCOBALAMIN Take 1,000 mcg by mouth daily.   vitamin C 1000 MG tablet Take 1,000 mg by mouth daily.       Review of Systems  Constitutional: Negative for activity change, appetite change, chills, fatigue and fever.  HENT: Negative for congestion, rhinorrhea, sinus pressure, sneezing and sore throat.   Eyes: Negative.   Respiratory: Negative for cough, chest tightness, shortness of breath and wheezing.   Cardiovascular: Negative for chest pain, palpitations and leg swelling.  Gastrointestinal: Negative for abdominal distention, abdominal pain, constipation, diarrhea, nausea and vomiting.  Endocrine: Negative for polydipsia, polyphagia and polyuria.  Genitourinary: Negative for dysuria, flank pain, frequency and urgency.  Musculoskeletal: Positive for gait problem.  Skin: Negative for color change, pallor and rash.       Sacral wound managed by wound care Nurse.   Neurological: Negative for dizziness, tremors, seizures, light-headedness and headaches.  Psychiatric/Behavioral: Negative for agitation, confusion, hallucinations and sleep disturbance. The patient is not nervous/anxious.      There is no immunization history on file for this patient. Pertinent  Health Maintenance Due  Topic Date Due  . PNA vac Low Risk Adult (1 of 2 - PCV13) 06/30/1991  . INFLUENZA VACCINE   02/28/2016      Vitals:   07/10/16 1054  BP: 115/61  Pulse: 76  Resp: 18  Temp: 98.5 F (36.9 C)  TempSrc: Oral  SpO2: 93%   There is no height or weight on file to calculate BMI. Physical Exam  Constitutional:  Thin elderly in no acute distress   HENT:  Head: Normocephalic.  Eyes: Conjunctivae and EOM are normal. Pupils are equal, round, and reactive to light. Right eye exhibits no discharge. Left eye exhibits no discharge. No scleral icterus.  Neck: Normal range of motion. No JVD present. No thyromegaly present.  Cardiovascular: Normal rate, regular rhythm, normal heart sounds and intact distal pulses.  Exam reveals no gallop and no friction rub.   No murmur heard. Pulmonary/Chest: Effort normal and breath sounds normal. No respiratory distress. He has no wheezes. He has no rales.  Abdominal: Soft. Bowel sounds are normal. He exhibits no distension. There is no tenderness. There is no rebound and no guarding.  Musculoskeletal: He exhibits no edema, tenderness or deformity.  Moves x 4 extremities. Unsteady gait   Lymphadenopathy:    He has no cervical adenopathy.  Neurological: He is alert.  Skin: Skin is warm and dry. No rash noted. No erythema. No pallor.  Sacral drsg dry clean and intact. Left fore arm skin tear without any signs of infections.   Psychiatric: He has a normal mood and affect.    Labs reviewed:  Recent Labs  06/16/16 2024  06/18/16 0828 06/19/16 0946 06/20/16 1030 06/25/16 07/02/16 0120  NA 136  < > 139 138 137 138 140  K 4.1  < > 3.0* 3.1* 4.0 3.9 4.0  CL 108  < > 113* 108 105  --   --   CO2 19*  < > 23 26 27   --   --   GLUCOSE 177*  < > 96 104* 110*  --   --   BUN 99*  < > 33* 20 14 20 20   CREATININE 1.41*  < > 0.87 0.90 0.84 0.9 1.0  CALCIUM 8.0*  < > 7.1* 7.1* 7.6*  --   --   MG 2.2  --   --   --   --   --   --   PHOS 2.7  --   --   --   --   --   --   < > = values in this interval not displayed.  Recent Labs  06/17/16 0809  06/18/16 0828 06/19/16 0946 06/25/16  AST 26 24 19  9*  ALT 13* 15* 14* 11  ALKPHOS 34* 37* 45 50  BILITOT 0.7 0.7 0.6  --   PROT 4.2* 4.2* 4.4*  --   ALBUMIN 2.4* 2.3* 2.3*  --     Recent Labs  06/19/16 0946  06/20/16 1030 06/21/16 0716 06/22/16 1031 06/25/16 07/02/16 0120  WBC 21.0*  --  18.5* 15.2* 12.0* 14.3 8.5  NEUTROABS 17.3*  --  13.8* 11.5*  --   --   --   HGB 7.3*  < > 7.8* 7.5* 7.5* 8.1* 8.2*  HCT 21.5*  --  23.2* 22.1* 22.6* 25* 26*  MCV 92.2  --  93.5 93.1 94.7  --   --   PLT 133*  --  151 173 199 302 301  < > = values in this interval not displayed.   Assessment/Plan Skin Tear  Left forearm.Wound care Nurse to continue to monitor.   AMS Fell during the night but states does not remember falling. Alert to self. Will obtain Urine specimen for U/A and C/S rule out UTI. CBC/diff, BMP 07/11/2016  Fall encounter Facility Nurse reports fall during the night. No acute injuries noted. Continue to monitor. Fall and safety precautions per facility protocol.   Family/ staff Communication: Reviewed plan of care with patient and facility Nurse supervisor.   Labs/tests ordered: Urine specimen for U/A and C/S. CBC/diff, BMP 07/11/2016

## 2016-07-11 NOTE — Telephone Encounter (Signed)
It would be better to discuss in person at their convenience , so that I can explain things better and ensure that I have conveyed the message appropriately and has been understood.   Am willing to meet them at their convinience at any time of their choice  Dr Jonathon Bellows  Gastroenterology/Hepatology Pager: [phone]

## 2016-07-16 ENCOUNTER — Non-Acute Institutional Stay (SKILLED_NURSING_FACILITY): Payer: Medicare Other | Admitting: Family

## 2016-07-16 DIAGNOSIS — N3000 Acute cystitis without hematuria: Secondary | ICD-10-CM | POA: Diagnosis not present

## 2016-07-16 MED ORDER — SACCHAROMYCES BOULARDII 250 MG PO CAPS: 250.0000 mg | ORAL_CAPSULE | Freq: Two times a day (BID) | ORAL | 0 refills | Status: AC

## 2016-07-16 MED ORDER — NITROFURANTOIN MONOHYD MACRO 100 MG PO CAPS: 100.0000 mg | ORAL_CAPSULE | Freq: Two times a day (BID) | ORAL | Status: DC

## 2016-07-16 NOTE — Progress Notes (Signed)
Location:  [place] Room Number: [id number]  Place of Service:  SNF [phone]) Provider: Dinah Ngetich FNP-C   PATERSON,ROBERT, MD  Patient Care Team: [name], MD as PCP - General (Internal Medicine)  Extended Emergency Contact Information Primary Emergency Contact: [name] Address: [address] [place]          [place], [place] [address] [name] of [place] Phone: [phone] Work Phone: [phone] Mobile Phone: [phone] Relation: Daughter Secondary Emergency Contact: [name] Address: [address]          [place], [place] [address] [name] of [place] Phone: [phone] Mobile Phone: [phone] Relation: Daughter  Code Status:  DNR  Goals of care: Advanced Directive information Advanced Directives 07/10/2016  Does Patient Have a Medical Advance Directive? Yes  Type of Advance Directive Out of facility DNR (pink MOST or yellow form)  Does patient want to make changes to medical advance directive? -  Would patient like information on creating a medical advance directive? -  Pre-existing out of facility DNR order (yellow form or pink MOST form) Yellow form placed in chart (order not valid for inpatient use)     Chief Complaint  Patient presents with  . Acute Visit    abnormal lab results     HPI:  Pt is a 82 y.o. male seen today at [facility] for an acute visit for evaluation of abnormal lab results. He is seen in his room today. He denies any acute issues.Facility Nurse reports patient continue to be confused. His recent urine analysis results showed cloudy urine with trace leukocytes. Urine cultures positive for > 100,000 colonies of citrobacter freundii. He denies any fever or chills.    Past Medical History:  Diagnosis Date  . Bladder cancer ([place])   . Decubitus ulcer   . Hypertension   . Muscle weakness   . UTI (urinary tract infection)    Past Surgical  History:  Procedure Laterality Date  . ESOPHAGOGASTRODUODENOSCOPY (EGD) WITH PROPOFOL N/A 06/20/2016   Procedure: ESOPHAGOGASTRODUODENOSCOPY (EGD) WITH PROPOFOL;  Surgeon: Jonathon Bellows, MD;  Location: [facility] ENDOSCOPY;  Service: Endoscopy;  Laterality: N/A;  . ILEAL POUCH     s/p bladder cancer  . none      Allergies  Allergen Reactions  . Ciprofloxacin Anaphylaxis    Allergies as of 07/16/2016      Reactions   Ciprofloxacin Anaphylaxis      Medication List       Accurate as of 07/16/16  2:25 PM. Always use your most recent med list.          acetaminophen 500 MG tablet Commonly known as:  TYLENOL Take 500 mg by mouth 3 (three) times daily. 6 am, 2 pm, 10 pm   aspirin EC 81 MG tablet Take 81 mg by mouth once. tuesday   CALCIUM 1000 + D 1000-800 MG-UNIT Tabs Generic drug:  Calcium Carb-Cholecalciferol Take 1 tablet by mouth daily.   CALMOSEPTINE 0.44-20.6 % Oint Generic drug:  Menthol-Zinc Oxide Apply 1 application topically every 12 (twelve) hours as needed. To buttocks for rash   cholestyramine light 4 g packet Commonly known as:  PREVALITE Take 4 g by mouth daily.   ferrous sulfate 325 (65 FE) MG tablet Take 325 mg by mouth daily with breakfast.   Fluticasone-Salmeterol 250-50 MCG/DOSE Aepb Commonly known as:  ADVAIR Inhale 1 puff into the lungs  2 (two) times daily.   loperamide 2 MG tablet Commonly known as:  IMODIUM A-D Take 2 mg by mouth every 6 (six) hours as needed for diarrhea or loose stools.   multivitamin tablet Take 1 tablet by mouth daily.   NUTRITIONAL DRINK Liqd Take 60 mLs by mouth 2 (two) times daily. MED PASS   pantoprazole 40 MG tablet Commonly known as:  PROTONIX Take 1 tablet (40 mg total) by mouth daily.   potassium citrate 10 MEQ (1080 MG) SR tablet Commonly known as:  UROCIT-K Take 10 mEq by mouth 2 (two) times daily.   saccharomyces boulardii 250 MG capsule Commonly known as:  FLORASTOR Take 1 capsule (250 mg total) by  mouth 2 (two) times daily.   senna-docusate 8.6-50 MG tablet Commonly known as:  Senokot-S Take 1 tablet by mouth at bedtime as needed for mild constipation.   simethicone 80 MG chewable tablet Commonly known as:  MYLICON Chew 80 mg by mouth every 6 (six) hours as needed for flatulence.   sucralfate 1 g tablet Commonly known as:  CARAFATE Take 1 tablet (1 g total) by mouth 4 (four) times daily -  with meals and at bedtime.   traMADol 50 MG tablet Commonly known as:  ULTRAM Take 1 tablet (50 mg total) by mouth every 12 (twelve) hours as needed.   vitamin B-12 1000 MCG tablet Commonly known as:  CYANOCOBALAMIN Take 1,000 mcg by mouth daily.   vitamin C 1000 MG tablet Take 1,000 mg by mouth daily.       Review of Systems  Constitutional: Negative for activity change, appetite change, chills, fatigue and fever.  HENT: Negative for congestion, rhinorrhea, sinus pressure, sneezing and sore throat.   Eyes: Negative.   Respiratory: Negative for cough, chest tightness, shortness of breath and wheezing.   Cardiovascular: Negative for chest pain, palpitations and leg swelling.  Gastrointestinal: Negative for abdominal distention, abdominal pain, constipation, diarrhea, nausea and vomiting.  Genitourinary: Negative for dysuria, flank pain, frequency and urgency.  Musculoskeletal: Positive for gait problem.  Skin: Negative for color change, pallor and rash.  Psychiatric/Behavioral: Positive for confusion. Negative for agitation, hallucinations and sleep disturbance. The patient is not nervous/anxious.      There is no immunization history on file for this patient. Pertinent  Health Maintenance Due  Topic Date Due  . PNA vac Low Risk Adult (1 of 2 - PCV13) 06/30/1991  . INFLUENZA VACCINE  02/28/2016      Vitals:   07/16/16 1100  BP: (!) 117/56  Pulse: 70  Resp: 18  Temp: 97.9 F (36.6 C)  SpO2: 96%  Weight: 139 lb 6.4 oz (63.2 kg)  Height: 5\' 6"  (1.676 m)   Body mass  index is 22.5 kg/m. Physical Exam  Constitutional:  Thin elderly in no acute distress   HENT:  Head: Normocephalic.  Eyes: Conjunctivae and EOM are normal. Pupils are equal, round, and reactive to light. Right eye exhibits no discharge. Left eye exhibits no discharge. No scleral icterus.  Neck: Normal range of motion. No JVD present. No thyromegaly present.  Cardiovascular: Normal rate, regular rhythm, normal heart sounds and intact distal pulses.  Exam reveals no gallop and no friction rub.   No murmur heard. Pulmonary/Chest: Effort normal and breath sounds normal. No respiratory distress. He has no wheezes. He has no rales.  Abdominal: Soft. Bowel sounds are normal. He exhibits no distension. There is no tenderness. There is no rebound and no guarding.  Musculoskeletal: He exhibits  no edema, tenderness or deformity.  Moves x 4 extremities. Unsteady gait   Lymphadenopathy:    He has no cervical adenopathy.  Neurological: He is alert.  Skin: Skin is warm and dry. No rash noted. No erythema. No pallor.  Psychiatric: He has a normal mood and affect.    Labs reviewed:  Recent Labs  06/16/16 2024  06/18/16 0828 06/19/16 0946 06/20/16 1030 06/25/16 07/02/16 0120  NA 136  < > 139 138 137 138 140  K 4.1  < > 3.0* 3.1* 4.0 3.9 4.0  CL 108  < > 113* 108 105  --   --   CO2 19*  < > 23 26 27   --   --   GLUCOSE 177*  < > 96 104* 110*  --   --   BUN 99*  < > 33* 20 14 20 20   CREATININE 1.41*  < > 0.87 0.90 0.84 0.9 1.0  CALCIUM 8.0*  < > 7.1* 7.1* 7.6*  --   --   MG 2.2  --   --   --   --   --   --   PHOS 2.7  --   --   --   --   --   --   < > = values in this interval not displayed.  Recent Labs  06/17/16 0809 06/18/16 0828 06/19/16 0946 06/25/16  AST 26 24 19  9*  ALT 13* 15* 14* 11  ALKPHOS 34* 37* 45 50  BILITOT 0.7 0.7 0.6  --   PROT 4.2* 4.2* 4.4*  --   ALBUMIN 2.4* 2.3* 2.3*  --     Recent Labs  06/19/16 0946  06/20/16 1030 06/21/16 0716 06/22/16 1031 06/25/16  07/02/16 0120  WBC 21.0*  --  18.5* 15.2* 12.0* 14.3 8.5  NEUTROABS 17.3*  --  13.8* 11.5*  --   --   --   HGB 7.3*  < > 7.8* 7.5* 7.5* 8.1* 8.2*  HCT 21.5*  --  23.2* 22.1* 22.6* 25* 26*  MCV 92.2  --  93.5 93.1 94.7  --   --   PLT 133*  --  151 173 199 302 301  < > = values in this interval not displayed.  Assessment/Plan Acute cystitis without hematuria  urine analysis results showed cloudy urine with trace leukocytes. Urine cultures positive for > 100,000 colonies of citrobacter freundii.Start Nitrofurantoin 100 mg Capsule one by mouth every 12 hours X 7 days. Florastor 250 mg Capsule one by mouth every 12 Hours for antibiotics associated diarrhea prophylaxis.     Family/ staff Communication: Reviewed plan of care with patient and facility Nurse supervisor.   Labs/tests ordered:  None

## 2016-07-18 NOTE — Telephone Encounter (Signed)
Tried leaving a vm for daughter, Vaughan Basta to return my call. No vm box set up.

## 2016-07-24 ENCOUNTER — Ambulatory Visit (INDEPENDENT_AMBULATORY_CARE_PROVIDER_SITE_OTHER): Payer: Medicare Other | Admitting: Gastroenterology

## 2016-07-24 DIAGNOSIS — K2210 Ulcer of esophagus without bleeding: Secondary | ICD-10-CM | POA: Diagnosis not present

## 2016-07-24 DIAGNOSIS — K2270 Barrett's esophagus without dysplasia: Secondary | ICD-10-CM

## 2016-07-24 NOTE — Progress Notes (Signed)
He was recently admitted 06/13/16 with acute bronchitis , septic shock . There was concern for GI bleed and he underwent an EGD on 06/20/16 . A large 6-7 long hiatal hernia was noted, a large esophageal ulcer within a segment of barretes which was atleast 11 cm long was seen . No active bleeding was noted.The edge of the ulcer was biopsied which showed no malignancy , the mucosa bx was positive for barrettes with no dysplasia. I commenced him on a  high dose PPI, Carafate with plan to repeat EGD in 6 weeks to ensure healing .   Today his family( patients daughter Izora Gala, son in Sports coach , another daughter Vaughan Basta, 3rd daughter Amy was on speaker phone) who are health care POA holders are here to discuss the next steps as it is very hard for the patient to come in .   I explained that the ulcer within the barrettes segment at times may be secondary to maliganancy or may  be not in which case it is due to reflux. The only way to confirm would be to repeat EGD. In view of age and comorbidities  , I do understand the options are limited .  Provided options of doing nothing vs repeat EGD vs barium swallow. They will discuss as a family and come up with a decision. They were clear that if it were to be cancer they would not want any chemotherapy/radiation or surgery .   Dr Jonathon Bellows  Gastroenterology/Hepatology Pager: [phone]

## 2016-07-24 NOTE — Telephone Encounter (Signed)
Pt's daughter came in today, 07/24/16 to discuss results.

## 2016-07-25 ENCOUNTER — Encounter: Payer: Self-pay | Admitting: Family

## 2016-07-25 ENCOUNTER — Non-Acute Institutional Stay (SKILLED_NURSING_FACILITY): Payer: Medicare Other | Admitting: Family

## 2016-07-25 DIAGNOSIS — K219 Gastro-esophageal reflux disease without esophagitis: Secondary | ICD-10-CM

## 2016-07-25 DIAGNOSIS — E43 Unspecified severe protein-calorie malnutrition: Secondary | ICD-10-CM

## 2016-07-25 DIAGNOSIS — R2681 Unsteadiness on feet: Secondary | ICD-10-CM | POA: Diagnosis not present

## 2016-08-25 ENCOUNTER — Emergency Department
Admission: EM | Admit: 2016-08-25 | Discharge: 2016-08-25 | Disposition: A | Discharge status: Home / Self Care | Payer: Medicare Other | Attending: Emergency Medicine | Admitting: Emergency Medicine

## 2016-08-25 ENCOUNTER — Encounter: Payer: Self-pay | Admitting: Emergency Medicine

## 2016-08-25 DIAGNOSIS — I10 Essential (primary) hypertension: Secondary | ICD-10-CM | POA: Diagnosis not present

## 2016-08-25 DIAGNOSIS — N390 Urinary tract infection, site not specified: Secondary | ICD-10-CM | POA: Diagnosis not present

## 2016-08-25 DIAGNOSIS — F918 Other conduct disorders: Secondary | ICD-10-CM | POA: Diagnosis present

## 2016-08-25 DIAGNOSIS — Z79899 Other long term (current) drug therapy: Secondary | ICD-10-CM | POA: Insufficient documentation

## 2016-08-25 DIAGNOSIS — Z7982 Long term (current) use of aspirin: Secondary | ICD-10-CM | POA: Insufficient documentation

## 2016-08-25 DIAGNOSIS — Z87891 Personal history of nicotine dependence: Secondary | ICD-10-CM | POA: Insufficient documentation

## 2016-08-25 DIAGNOSIS — R4689 Other symptoms and signs involving appearance and behavior: Secondary | ICD-10-CM

## 2016-08-25 LAB — URINALYSIS, COMPLETE (UACMP) WITH MICROSCOPIC
BILIRUBIN URINE: NEGATIVE
GLUCOSE, UA: NEGATIVE mg/dL
HGB URINE DIPSTICK: NEGATIVE
Ketones, ur: NEGATIVE mg/dL
NITRITE: POSITIVE — AB
Protein, ur: NEGATIVE mg/dL
SPECIFIC GRAVITY, URINE: 1.01 (ref 1.005–1.030)
SQUAMOUS EPITHELIAL / LPF: NONE SEEN
pH: 8 (ref 5.0–8.0)

## 2016-08-25 LAB — CBC
HCT: 32.9 % — ABNORMAL LOW (ref 40.0–52.0)
Hemoglobin: 10.8 g/dL — ABNORMAL LOW (ref 13.0–18.0)
MCH: 28.3 pg (ref 26.0–34.0)
MCHC: 32.9 g/dL (ref 32.0–36.0)
MCV: 86.2 fL (ref 80.0–100.0)
PLATELETS: 236 10*3/uL (ref 150–440)
RBC: 3.82 MIL/uL — ABNORMAL LOW (ref 4.40–5.90)
RDW: 16.7 % — AB (ref 11.5–14.5)
WBC: 6.6 10*3/uL (ref 3.8–10.6)

## 2016-08-25 LAB — BASIC METABOLIC PANEL
Anion gap: 7 (ref 5–15)
BUN: 19 mg/dL (ref 6–20)
CHLORIDE: 101 mmol/L (ref 101–111)
CO2: 27 mmol/L (ref 22–32)
CREATININE: 1.16 mg/dL (ref 0.61–1.24)
Calcium: 8.7 mg/dL — ABNORMAL LOW (ref 8.9–10.3)
GFR calc Af Amer: >60 mL/min (ref >60)
GFR calc non Af Amer: 54 mL/min — ABNORMAL LOW (ref >60)
GLUCOSE: 95 mg/dL (ref 65–99)
Potassium: 3.8 mmol/L (ref 3.5–5.1)
SODIUM: 135 mmol/L (ref 135–145)

## 2016-08-25 MED ORDER — CEPHALEXIN 500 MG PO CAPS
500.0000 mg | ORAL_CAPSULE | Freq: Once | ORAL | Status: AC
  Administered 2016-08-25: 500 mg via ORAL
  Filled 2016-08-25: qty 1

## 2016-08-25 MED ORDER — CEPHALEXIN 500 MG PO CAPS: 500.0000 mg | ORAL_CAPSULE | Freq: Two times a day (BID) | ORAL | 0 refills | Status: DC

## 2016-08-25 NOTE — ED Notes (Signed)
Pt sleeping,vss
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
## 2016-08-25 NOTE — ED Notes (Signed)
Spoke with daughter and let her know pt had a uti and we would be sending an rx

## 2016-08-25 NOTE — Discharge Instructions (Signed)
Please take antibiotic for the entire course. Please follow-up with your primary care doctor in 2-3 days for recheck/reevaluation. Return to the emergency department for any worsening symptoms.
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
## 2016-08-25 NOTE — ED Notes (Signed)
Ems called for transport. Medical necessity on chart

## 2016-08-26 NOTE — Progress Notes (Signed)
Location:  [facility] Room Number: [id number] Place of Service:  SNF [id number])  Provider: Marlowe Sax FNP-C   PCP: [name], MD Patient Care Team: [name], MD as PCP - General (Internal Medicine)  Extended Emergency Contact Information Primary Emergency Contact: [name] Address: [address] [place]          [place], [place] [address] [name] of [place] Phone: [phone] Work Phone: [phone] Mobile Phone: [phone] Relation: Daughter Secondary Emergency Contact: [name] Address: [address]          [place], [place] [address] [name] of [place] Phone: [phone] Mobile Phone: [phone] Relation: Daughter  Code Status: DNR  Goals of care:  Advanced Directive information Advanced Directives 08/25/2016  Does Patient Have a Medical Advance Directive? Yes  Type of Advance Directive Out of facility DNR (pink MOST or yellow form)  Does patient want to make changes to medical advance directive? -  Would patient like information on creating a medical advance directive? -  Pre-existing out of facility DNR order (yellow form or pink MOST form) -     Allergies  Allergen Reactions  . Ciprofloxacin Anaphylaxis    Chief Complaint  Patient presents with  . Discharge Note    HPI:  83 y.o. male seen today at [facility] for discharge. He was here for short term rehabilitation post hospital admission from 06/13/16-06/22/16 with sepsis and bacteremia. He was treated with antibiotics. He was seen by ID. He had GI bleed with esophagitis confirmed on EGD and clean base ulcer. He was seen by GI team and placed on PPI and sucralfate. He received 2 units PRBC transfusion.He has a medical history HTN, Bladder cancer among other conditions. He is seen in his room today.During his stay here in rehab he had unwitnessed fall episode had left forearm skin tear .His Urine cultures  was positive for > 100,000 colonies of citrobacter freundii. He was treated with nitrofurantoin twice daily X 7 days. He completed antibiotics 07/16/2016. He denies any acute issues this visit.He has worked well with PT/OT now stable for discharge.He will be discharged with Home health PT/OT to continue with ROM, Exercise, Gait stability and muscle strengthening. He does not require any DME. Home health services will be arranged by facility social worker prior to discharge. Prescription medication will be written x 1 month then patient to follow up with PCP in 1-2 weeks. Facility staff report no new concerns.     Past Medical History:  Diagnosis Date  . Bladder cancer ([place])   . Decubitus ulcer   . Hypertension   . Muscle weakness   . UTI (urinary tract infection)     Past Surgical History:  Procedure Laterality Date  . ESOPHAGOGASTRODUODENOSCOPY (EGD) WITH PROPOFOL N/A 06/20/2016   Procedure: ESOPHAGOGASTRODUODENOSCOPY (EGD) WITH PROPOFOL;  Surgeon: Jonathon Bellows, MD;  Location: [facility] ENDOSCOPY;  Service: Endoscopy;  Laterality: N/A;  . ILEAL POUCH     s/p bladder cancer  . none        reports that he has quit smoking. He has never used smokeless tobacco. He reports that he does not drink alcohol or use drugs. Social History   Social History  . Marital status: Married    Spouse name: N/A  . Number of children: N/A  . Years of education: N/A   Occupational History  . retired    Social History  Main Topics  . Smoking status: Former Research scientist (life sciences)  . Smokeless tobacco: Never Used  . Alcohol use No  . Drug use: No  . Sexual activity: Not on file   Other Topics Concern  . Not on file   Social History Narrative  . No narrative on file    Allergies  Allergen Reactions  . Ciprofloxacin Anaphylaxis    Pertinent  Health Maintenance Due  Topic Date Due  . PNA vac Low Risk Adult (2 of 2 - PPSV23) 10/15/2014  . INFLUENZA VACCINE  02/28/2016    Medications: Allergies as of 07/25/2016       Reactions   Ciprofloxacin Anaphylaxis      Medication List       Accurate as of 07/25/16 11:59 PM. Always use your most recent med list.          acetaminophen 500 MG tablet Commonly known as:  TYLENOL Take 500 mg by mouth 3 (three) times daily. 6 am, 2 pm, 10 pm   aspirin EC 81 MG tablet Take 81 mg by mouth once. Tuesday   CALCIUM 1000 + D 1000-800 MG-UNIT Tabs Generic drug:  Calcium Carb-Cholecalciferol Take 1 tablet by mouth daily.   CALMOSEPTINE 0.44-20.6 % Oint Generic drug:  Menthol-Zinc Oxide Apply 1 application topically every 12 (twelve) hours as needed. To buttocks for rash   cholestyramine light 4 g packet Commonly known as:  PREVALITE Take 4 g by mouth daily.   ferrous sulfate 325 (65 FE) MG tablet Take 325 mg by mouth daily with breakfast.   Fluticasone-Salmeterol 250-50 MCG/DOSE Aepb Commonly known as:  ADVAIR Inhale 1 puff into the lungs 2 (two) times daily.   loperamide 2 MG tablet Commonly known as:  IMODIUM A-D Take 2 mg by mouth every 6 (six) hours as needed for diarrhea or loose stools.   multivitamin tablet Take 1 tablet by mouth daily.   NUTRITIONAL DRINK Liqd Take 60 mLs by mouth 2 (two) times daily. MED PASS   pantoprazole 40 MG tablet Commonly known as:  PROTONIX Take 1 tablet (40 mg total) by mouth daily.   potassium citrate 10 MEQ (1080 MG) SR tablet Commonly known as:  UROCIT-K Take 10 mEq by mouth 2 (two) times daily.   saccharomyces boulardii 250 MG capsule Commonly known as:  FLORASTOR Take 1 capsule (250 mg total) by mouth 2 (two) times daily.   senna-docusate 8.6-50 MG tablet Commonly known as:  Senokot-S Take 1 tablet by mouth at bedtime as needed for mild constipation.   simethicone 80 MG chewable tablet Commonly known as:  MYLICON Chew 80 mg by mouth every 6 (six) hours as needed for flatulence.   sucralfate 1 g tablet Commonly known as:  CARAFATE Take 1 tablet (1 g total) by mouth 4 (four) times daily -   with meals and at bedtime.   traMADol 50 MG tablet Commonly known as:  ULTRAM Take 1 tablet (50 mg total) by mouth every 12 (twelve) hours as needed.   vitamin B-12 1000 MCG tablet Commonly known as:  CYANOCOBALAMIN Take 1,000 mcg by mouth daily.   vitamin C 1000 MG tablet Take 1,000 mg by mouth daily.       Review of Systems  Constitutional: Negative for activity change, appetite change, chills, fatigue and fever.  HENT: Negative for congestion, rhinorrhea, sinus pressure, sneezing and sore throat.   Eyes: Negative.   Respiratory: Negative for cough, chest tightness, shortness of breath and wheezing.   Cardiovascular: Negative for chest  pain, palpitations and leg swelling.  Gastrointestinal: Negative for abdominal distention, abdominal pain, constipation, diarrhea, nausea and vomiting.  Endocrine: Negative for cold intolerance, heat intolerance, polydipsia, polyphagia and polyuria.  Genitourinary: Negative for dysuria, flank pain, frequency and urgency.  Musculoskeletal: Positive for gait problem.  Skin: Negative for color change, pallor and rash.  Neurological: Negative for dizziness, tremors, seizures, syncope and headaches.  Hematological: Does not bruise/bleed easily.  Psychiatric/Behavioral: Negative for agitation, confusion, hallucinations and sleep disturbance. The patient is not nervous/anxious.     Vitals:   07/25/16 1134  BP: 103/60  Pulse: 80  Resp: 20  Temp: 98.9 F (37.2 C)  TempSrc: Oral  SpO2: 95%  Weight: 139 lb 6.4 oz (63.2 kg)  Height: 5\' 6"  (1.676 m)   Body mass index is 22.5 kg/m. Physical Exam  Constitutional:  Thin elderly in no acute distress   HENT:  Head: Normocephalic.  Eyes: Conjunctivae and EOM are normal. Pupils are equal, round, and reactive to light. Right eye exhibits no discharge. Left eye exhibits no discharge. No scleral icterus.  Neck: Normal range of motion. No JVD present. No thyromegaly present.  Cardiovascular: Normal rate,  regular rhythm, normal heart sounds and intact distal pulses.  Exam reveals no gallop and no friction rub.   No murmur heard. Pulmonary/Chest: Effort normal and breath sounds normal. No respiratory distress. He has no wheezes. He has no rales.  Abdominal: Soft. Bowel sounds are normal. He exhibits no distension. There is no tenderness. There is no rebound and no guarding.  Musculoskeletal: He exhibits no edema, tenderness or deformity.  Moves x 4 extremities. Unsteady gait   Lymphadenopathy:    He has no cervical adenopathy.  Neurological: He is alert.  Skin: Skin is warm and dry. No rash noted. No erythema. No pallor.  Psychiatric: He has a normal mood and affect.    Labs reviewed: Basic Metabolic Panel:  Recent Labs  06/16/16 2024  06/19/16 0946 06/20/16 1030  06/26/16 07/02/16 0120 08/25/16 1207  NA 136  < > 138 137  < > 139 140 135  K 4.1  < > 3.1* 4.0  < > 4.0 4.0 3.8  CL 108  < > 108 105  --   --   --  101  CO2 19*  < > 26 27  --   --   --  27  GLUCOSE 177*  < > 104* 110*  --   --   --  95  BUN 99*  < > 20 14  < > 19 20 19   CREATININE 1.41*  < > 0.90 0.84  < > 0.9 1.0 1.16  CALCIUM 8.0*  < > 7.1* 7.6*  --   --   --  8.7*  MG 2.2  --   --   --   --   --   --   --   PHOS 2.7  --   --   --   --   --   --   --   < > = values in this interval not displayed. Liver Function Tests:  Recent Labs  06/17/16 0809 06/18/16 0828 06/19/16 0946 06/25/16 06/26/16  AST 26 24 19  9* 7*  ALT 13* 15* 14* 11 10  ALKPHOS 34* 37* 45 50 54  BILITOT 0.7 0.7 0.6  --   --   PROT 4.2* 4.2* 4.4*  --   --   ALBUMIN 2.4* 2.3* 2.3*  --   --  Recent Labs  06/16/16 2017  AMMONIA 20   CBC:  Recent Labs  06/20/16 1030 06/21/16 0716 06/22/16 1031  06/26/16 07/02/16 0120 08/25/16 1207  WBC 18.5* 15.2* 12.0*  < > 10.5 8.5 6.6  NEUTROABS 13.8* 11.5*  --   --  7  --   --   HGB 7.8* 7.5* 7.5*  < > 7.5* 8.2* 10.8*  HCT 23.2* 22.1* 22.6*  < > 24* 26* 32.9*  MCV 93.5 93.1 94.7  --   --    --  86.2  PLT 151 173 199  < > 301 301 236  < > = values in this interval not displayed. Cardiac Enzymes:  Recent Labs  06/13/16 0254 06/16/16 2024 06/17/16 0809  TROPONINI <0.03 <0.03 <0.03     Recent Labs  06/17/16 1154  GLUCAP 103*   Assessment/Plan:   1. Unsteady gait Has worked well with PT/ OT. Will discharge home PT/OT to continue with ROM, Exercise, Gait stability and muscle strengthening.No DME required has own. Fall and safety precautions.   2. Protein-calorie malnutrition, severe Continue current protein supplements. BMP in 1-2 weeks with PCP.   3. Gastroesophageal reflux disease without esophagitis Status post hospital admission post hospital admission from 06/13/16-06/22/16 with sepsis, bacteremia and GI bleed with esophagitis confirmed on EGD and clean base ulcer. He was seen by GI team and placed on PPI and sucralfate. He received 2 units PRBC transfusion.Continue on Protonix 40 mg Tablet daily. Continue to monitor.   Patient is being discharged with the following home health services:   -  PT/OT to continue with ROM, Exercise, Gait stability and muscle strengthening  Patient is being discharged with the following durable medical equipment:   -None required.    Patient has been advised to f/u with their PCP in 1-2 weeks to for a transitions of care visit.Social services at their facility was responsible for arranging this appointment.Pt was provided with adequate prescriptions of noncontrolled medications to reach the scheduled appointment.For controlled substances, a limited supply was provided as appropriate for the individual patient.  If the pt normally receives these medications from a pain clinic or has a contract with another physician, these medications should be received from that clinic or physician only).    Future labs/tests needed:  CBC, BMP in 1-2 weeks with PCP

## 2016-08-27 LAB — URINE CULTURE

## 2016-12-17 ENCOUNTER — Encounter: Payer: Self-pay | Admitting: Emergency Medicine

## 2016-12-17 ENCOUNTER — Emergency Department
Admission: EM | Admit: 2016-12-17 | Discharge: 2016-12-18 | Disposition: A | Discharge status: Home / Self Care | Payer: Medicare Other | Attending: Emergency Medicine | Admitting: Emergency Medicine

## 2016-12-17 DIAGNOSIS — Z7982 Long term (current) use of aspirin: Secondary | ICD-10-CM | POA: Insufficient documentation

## 2016-12-17 DIAGNOSIS — K921 Melena: Secondary | ICD-10-CM | POA: Diagnosis not present

## 2016-12-17 DIAGNOSIS — Z87891 Personal history of nicotine dependence: Secondary | ICD-10-CM | POA: Diagnosis not present

## 2016-12-17 DIAGNOSIS — I10 Essential (primary) hypertension: Secondary | ICD-10-CM | POA: Insufficient documentation

## 2016-12-17 DIAGNOSIS — Z8551 Personal history of malignant neoplasm of bladder: Secondary | ICD-10-CM | POA: Insufficient documentation

## 2016-12-17 DIAGNOSIS — K625 Hemorrhage of anus and rectum: Secondary | ICD-10-CM | POA: Diagnosis present

## 2016-12-17 DIAGNOSIS — R197 Diarrhea, unspecified: Secondary | ICD-10-CM

## 2016-12-17 LAB — COMPREHENSIVE METABOLIC PANEL
ALK PHOS: 78 U/L (ref 38–126)
ALT: 13 U/L — ABNORMAL LOW (ref 17–63)
ANION GAP: 8 (ref 5–15)
AST: 22 U/L (ref 15–41)
Albumin: 4 g/dL (ref 3.5–5.0)
BILIRUBIN TOTAL: 0.4 mg/dL (ref 0.3–1.2)
BUN: 33 mg/dL — ABNORMAL HIGH (ref 6–20)
CALCIUM: 9.2 mg/dL (ref 8.9–10.3)
CO2: 27 mmol/L (ref 22–32)
Chloride: 103 mmol/L (ref 101–111)
Creatinine, Ser: 1.37 mg/dL — ABNORMAL HIGH (ref 0.61–1.24)
GFR calc non Af Amer: 44 mL/min — ABNORMAL LOW (ref >60)
GFR, EST AFRICAN AMERICAN: 51 mL/min — AB (ref >60)
Glucose, Bld: 148 mg/dL — ABNORMAL HIGH (ref 65–99)
POTASSIUM: 3.9 mmol/L (ref 3.5–5.1)
SODIUM: 138 mmol/L (ref 135–145)
TOTAL PROTEIN: 7.1 g/dL (ref 6.5–8.1)

## 2016-12-17 LAB — CBC
HCT: 39.1 % — ABNORMAL LOW (ref 40.0–52.0)
HEMOGLOBIN: 13.2 g/dL (ref 13.0–18.0)
MCH: 30.5 pg (ref 26.0–34.0)
MCHC: 33.9 g/dL (ref 32.0–36.0)
MCV: 90 fL (ref 80.0–100.0)
Platelets: 218 10*3/uL (ref 150–440)
RBC: 4.34 MIL/uL — AB (ref 4.40–5.90)
RDW: 16.7 % — ABNORMAL HIGH (ref 11.5–14.5)
WBC: 12.1 10*3/uL — ABNORMAL HIGH (ref 3.8–10.6)

## 2016-12-17 NOTE — ED Triage Notes (Addendum)
Pt presents to ED 11 c/o rectal bleeding that started today; pt is not complaining of any pain; pt states he was not aware he was bleeding; pt is alert, unaware of where he is, why he is here, who the president is, or what year it is; pt is calm and cooperative; pt has a urine bag;

## 2016-12-18 ENCOUNTER — Emergency Department: Payer: Medicare Other

## 2016-12-18 DIAGNOSIS — K921 Melena: Secondary | ICD-10-CM | POA: Diagnosis not present

## 2016-12-18 LAB — GASTROINTESTINAL PANEL BY PCR, STOOL (REPLACES STOOL CULTURE)
ADENOVIRUS F40/41: NOT DETECTED
Astrovirus: NOT DETECTED
CAMPYLOBACTER SPECIES: NOT DETECTED
CRYPTOSPORIDIUM: NOT DETECTED
Cyclospora cayetanensis: NOT DETECTED
ENTEROPATHOGENIC E COLI (EPEC): NOT DETECTED
Entamoeba histolytica: NOT DETECTED
Enteroaggregative E coli (EAEC): NOT DETECTED
Enterotoxigenic E coli (ETEC): NOT DETECTED
Giardia lamblia: NOT DETECTED
Norovirus GI/GII: NOT DETECTED
PLESIMONAS SHIGELLOIDES: NOT DETECTED
ROTAVIRUS A: NOT DETECTED
SALMONELLA SPECIES: NOT DETECTED
SHIGELLA/ENTEROINVASIVE E COLI (EIEC): NOT DETECTED
Sapovirus (I, II, IV, and V): NOT DETECTED
Shiga like toxin producing E coli (STEC): NOT DETECTED
Vibrio cholerae: NOT DETECTED
Vibrio species: NOT DETECTED
YERSINIA ENTEROCOLITICA: NOT DETECTED

## 2016-12-18 LAB — C DIFFICILE QUICK SCREEN W PCR REFLEX
C DIFFICILE (CDIFF) INTERP: NOT DETECTED
C DIFFICILE (CDIFF) TOXIN: NEGATIVE
C DIFFICLE (CDIFF) ANTIGEN: NEGATIVE

## 2016-12-18 LAB — TYPE AND SCREEN
ABO/RH(D): A POS
Antibody Screen: NEGATIVE

## 2016-12-18 MED ORDER — SODIUM CHLORIDE 0.9 % IV BOLUS (SEPSIS)
500.0000 mL | Freq: Once | INTRAVENOUS | Status: AC
  Administered 2016-12-18: 500 mL via INTRAVENOUS

## 2016-12-18 MED ORDER — LOPERAMIDE HCL 2 MG PO CAPS
2.0000 mg | ORAL_CAPSULE | Freq: Once | ORAL | Status: AC
  Administered 2016-12-18: 2 mg via ORAL
  Filled 2016-12-18: qty 1

## 2016-12-18 MED ORDER — IOPAMIDOL (ISOVUE-300) INJECTION 61%
75.0000 mL | Freq: Once | INTRAVENOUS | Status: AC | PRN
  Administered 2016-12-18: 75 mL via INTRAVENOUS

## 2016-12-18 NOTE — ED Notes (Signed)
Pt experienced a diarrheal BM; pt had to assisted to the bathroom, placed on the toilet, bed stripped and cleaned with clorox wipes; full linen change; pt was cleaned using wet wipes; new brief and gown put on patient; pt assisted back to bed; placed back on monitor; MD asked for medication for diarrhea, MD denied request.

## 2016-12-18 NOTE — ED Notes (Signed)

## 2016-12-18 NOTE — ED Notes (Signed)
Patient left by EMS for [facility].

## 2016-12-18 NOTE — Discharge Instructions (Signed)
Please follow up with your primary care physician.

## 2016-12-18 NOTE — ED Notes (Signed)
Called [facility] to inquire about the pt's baseline mentally, and how much bleeding they witnessed; spoke with Med Abran Richard (no RN available), she stated that the previous shift tech informed her of 2-3 bright red bloody stools, unknown amount of blood in each stool; per Thea Silversmith, pt's baseline mentally is that "he's forgetful sometimes" but has no diagnosis of dementia or alzheimer's.

## 2016-12-18 NOTE — ED Notes (Addendum)
Pt experienced a diarrheal BM; pt had to assisted to the bathroom, placed on the toilet, bed stripped and cleaned with clorox wipes; full linen change; pt was cleaned using wet wipes; new brief and gown put on patient; pt assisted back to bed; placed back on monitor;

## 2016-12-18 NOTE — ED Provider Notes (Signed)
[facility] Emergency Department Provider Note   ____________________________________________   First MD Initiated Contact with Patient 12/17/16 2339     (approximate)  I have reviewed the triage vital signs and the nursing notes.   HISTORY  Chief Complaint Rectal Bleeding    HPI Victor Parks is a 83 y.o. male who was sent into the hospital today from his nursing home with blood in his stool. According to EMS the bleeding started today. The patient is not on blood thinners. We did contact the nursing home and they state that the patient had 2 episodes earlier this evening with blood in his stool. They could not give [place] a determined amount. There was a concern to the patient was sent here. The patient reports that he does not know why he is here. He denies any pain in his abdomen at this time or any other concerns. The patient also does not know the date, where he is the president is. According to the nursing home the patient does not have a diagnosis of dementia but he has been forgetful in the past. The patient reports that he feels well. He is here today for evaluation.   Past Medical History:  Diagnosis Date  . Bladder cancer ([place])   . Decubitus ulcer   . Hypertension   . Muscle weakness   . UTI (urinary tract infection)     Patient Active Problem List   Diagnosis Date Noted  . Severe sepsis with septic shock ([place]) 06/17/2016  . Acute blood loss anemia 06/17/2016  . Septic shock ([place]) 06/16/2016  . Protein-calorie malnutrition, severe 06/15/2016  . Abdominal pain   . Dyspnea 06/13/2016  . Pressure injury of skin 06/13/2016    Past Surgical History:  Procedure Laterality Date  . ESOPHAGOGASTRODUODENOSCOPY (EGD) WITH PROPOFOL N/A 06/20/2016   Procedure: ESOPHAGOGASTRODUODENOSCOPY (EGD) WITH PROPOFOL;  Surgeon: Jonathon Bellows, MD;  Location: [facility] ENDOSCOPY;  Service: Endoscopy;  Laterality: N/A;  . ILEAL POUCH     s/p bladder cancer  . none       Prior to Admission medications   Medication Sig Start Date End Date Taking? Authorizing Provider  acetaminophen (TYLENOL) 500 MG tablet Take 500 mg by mouth 3 (three) times daily. 6 am, 2 pm, 10 pm    [provider]  Ascorbic Acid (VITAMIN C) 1000 MG tablet Take 1,000 mg by mouth daily.    [provider]  aspirin EC 81 MG tablet Take 81 mg by mouth once. Tuesday    [provider]  Calcium Carb-Cholecalciferol (CALCIUM 1000 + D) 1000-800 MG-UNIT TABS Take 1 tablet by mouth daily.    [provider]  cephALEXin (KEFLEX) 500 MG capsule Take 1 capsule (500 mg total) by mouth 2 (two) times daily. 08/25/16   Harvest Dark, MD  cholestyramine light (PREVALITE) 4 g packet Take 4 g by mouth daily.    [provider]  ferrous sulfate 325 (65 FE) MG tablet Take 325 mg by mouth daily with breakfast.    [provider]  Fluticasone-Salmeterol (ADVAIR) 250-50 MCG/DOSE AEPB Inhale 1 puff into the lungs 2 (two) times daily.    [provider]  loperamide (IMODIUM A-D) 2 MG tablet Take 2 mg by mouth every 6 (six) hours as needed for diarrhea or loose stools.    [provider]  Menthol-Zinc Oxide (CALMOSEPTINE) 0.44-20.6 % OINT Apply 1 application topically every 12 (twelve) hours as needed. To buttocks for rash    [provider]  Multiple Vitamin (MULTIVITAMIN) tablet Take 1 tablet by mouth daily.    [provider]  Nutritional Supplements (NUTRITIONAL DRINK) LIQD Take 60 mLs by mouth 2 (two) times daily. MED PASS    [provider]  pantoprazole (PROTONIX) 40 MG tablet Take 1 tablet (40 mg total) by mouth daily. 06/22/16   Bettey Costa, MD  potassium citrate (UROCIT-K) 10 MEQ (1080 MG) SR tablet Take 10 mEq by mouth 2 (two) times daily.    [provider]  senna-docusate (SENOKOT-S) 8.6-50 MG tablet Take 1 tablet by mouth at bedtime as needed for mild constipation. 06/22/16   Bettey Costa, MD    simethicone (MYLICON) 80 MG chewable tablet Chew 80 mg by mouth every 6 (six) hours as needed for flatulence.    [provider]  sucralfate (CARAFATE) 1 g tablet Take 1 tablet (1 g total) by mouth 4 (four) times daily -  with meals and at bedtime. 06/22/16 08/06/16  Bettey Costa, MD  traMADol (ULTRAM) 50 MG tablet Take 1 tablet (50 mg total) by mouth every 12 (twelve) hours as needed. 06/22/16   Bettey Costa, MD  vitamin B-12 (CYANOCOBALAMIN) 1000 MCG tablet Take 1,000 mcg by mouth daily.    [provider]    Allergies Ciprofloxacin  History reviewed. No pertinent family history.  Social History Social History  Substance Use Topics  . Smoking status: Former Research scientist (life sciences)  . Smokeless tobacco: Never Used  . Alcohol use No    Review of Systems  Constitutional: No fever/chills Eyes: No visual changes. ENT: No sore throat. Cardiovascular: Denies chest pain. Respiratory: Denies shortness of breath. Gastrointestinal: Blood in stool with No abdominal pain.  No nausea, no vomiting.  Genitourinary: Negative for dysuria. Musculoskeletal: Negative for back pain. Skin: Negative for rash. Neurological: Negative for headaches, focal weakness or numbness.   ____________________________________________   PHYSICAL EXAM:  VITAL SIGNS: ED Triage Vitals  Enc Vitals Group     BP 12/17/16 2310 108/66     Pulse Rate 12/17/16 2310 74     Resp 12/17/16 2310 19     Temp 12/17/16 2310 97.6 F (36.4 C)     Temp Source 12/17/16 2310 Oral     SpO2 12/17/16 2310 97 %     Weight 12/17/16 2311 126 lb 12.2 oz (57.5 kg)     Height 12/17/16 2311 5\' 10"  (1.778 m)     Head Circumference --      Peak Flow --      Pain Score --      Pain Loc --      Pain Edu? --      Excl. in [place]? --     Constitutional: Alert But not oriented to place or time. Well appearing and in no acute distress. Eyes: Conjunctivae are normal. PERRL. EOMI. Head: Atraumatic. Nose: No  congestion/rhinnorhea. Mouth/Throat: Mucous membranes are moist.  Oropharynx non-erythematous. Cardiovascular: Normal rate, regular rhythm. Grossly normal heart sounds.  Good peripheral circulation. Respiratory: Normal respiratory effort.  No retractions. Lungs CTAB. Gastrointestinal: Soft and nontender. No distention. Positive bowel sounds, urostomy in place Rectal: No gross blood on rectal exam, brown stool that is trace heme positive Musculoskeletal: No lower extremity tenderness nor edema.   Neurologic:  Normal speech and language.  Skin:  Skin is warm, dry and intact.  Psychiatric: Mood and affect are normal.   ____________________________________________   LABS (all labs ordered are listed, but only abnormal results are displayed)  Labs Reviewed  COMPREHENSIVE METABOLIC  PANEL - Abnormal; Notable for the following:       Result Value   Glucose, Bld 148 (*)    BUN 33 (*)    Creatinine, Ser 1.37 (*)    ALT 13 (*)    GFR calc non Af Amer 44 (*)    GFR calc Af Amer 51 (*)    All other components within normal limits  CBC - Abnormal; Notable for the following:    WBC 12.1 (*)    RBC 4.34 (*)    HCT 39.1 (*)    RDW 16.7 (*)    All other components within normal limits  GASTROINTESTINAL PANEL BY PCR, STOOL (REPLACES STOOL CULTURE)  C DIFFICILE QUICK SCREEN W PCR REFLEX  POC OCCULT BLOOD, ED  TYPE AND SCREEN   ____________________________________________  EKG  ED ECG REPORT I, Loney Hering, the attending physician, personally viewed and interpreted this ECG.   Date: 12/17/2016  EKG Time: 2313  Rate: 75  Rhythm: normal sinus rhythm, RBBB  Axis: normal  Intervals:right bundle branch block  ST&T Change: none  ____________________________________________  RADIOLOGY  CT abd and pelvis ____________________________________________   PROCEDURES  Procedure(s) performed: None  Procedures  Critical Care performed:  No  ____________________________________________   INITIAL IMPRESSION / ASSESSMENT AND PLAN / ED COURSE  Pertinent labs & imaging results that were available during my care of the patient were reviewed by me and considered in my medical decision making (see chart for details).  This is a 83 year old male who comes into the hospital today with a GI bleed according to the staff at his nursing home. The patient denies any pain and he does have some trace heme positive stool. I will send the patient for a CT scan to evaluate for possible diverticular disease versus colitis. The patient's blood work is unremarkable at this time.  Clinical Course as of Dec 19 646  Tue Dec 18, 2016  0244 No evidence of bowel obstruction or inflammation. Small esophageal hiatal hernia. Previous resection of bladder and prostate gland. Right lower quadrant urinary conduit. No evidence of ureteral obstruction. Multiple lumbar vertebral compression deformities.   CT Abdomen Pelvis W Contrast [AW]    Clinical Course User Index [AW] Loney Hering, MD   The patient has had some episodes of diarrhea here in the emergency department. It was sent for PCR and it was negative. I did add a C. difficile onto the patient's stool in the event the C. difficile to be causing his symptoms. The patient otherwise has no complaints. He will be discharged home to follow-up.  ____________________________________________   FINAL CLINICAL IMPRESSION(S) / ED DIAGNOSES  Final diagnoses:  Diarrhea, unspecified type  Hematochezia      NEW MEDICATIONS STARTED DURING THIS VISIT:  New Prescriptions   No medications on file     Note:  This document was prepared using Dragon voice recognition software and may include unintentional dictation errors.    Loney Hering, MD 12/18/16 [phone]

## 2017-02-10 ENCOUNTER — Emergency Department: Payer: Medicare Other

## 2017-02-10 ENCOUNTER — Emergency Department
Admission: EM | Admit: 2017-02-10 | Discharge: 2017-02-10 | Disposition: A | Discharge status: Home / Self Care | Payer: Medicare Other | Attending: Emergency Medicine | Admitting: Emergency Medicine

## 2017-02-10 DIAGNOSIS — Z8551 Personal history of malignant neoplasm of bladder: Secondary | ICD-10-CM | POA: Insufficient documentation

## 2017-02-10 DIAGNOSIS — Z87891 Personal history of nicotine dependence: Secondary | ICD-10-CM | POA: Diagnosis not present

## 2017-02-10 DIAGNOSIS — I10 Essential (primary) hypertension: Secondary | ICD-10-CM | POA: Diagnosis not present

## 2017-02-10 DIAGNOSIS — Z7982 Long term (current) use of aspirin: Secondary | ICD-10-CM | POA: Insufficient documentation

## 2017-02-10 DIAGNOSIS — Z79899 Other long term (current) drug therapy: Secondary | ICD-10-CM | POA: Diagnosis not present

## 2017-02-10 DIAGNOSIS — R55 Syncope and collapse: Secondary | ICD-10-CM | POA: Diagnosis not present

## 2017-02-10 LAB — CBC
HCT: 39.5 % — ABNORMAL LOW (ref 40.0–52.0)
HEMOGLOBIN: 13.5 g/dL (ref 13.0–18.0)
MCH: 31.4 pg (ref 26.0–34.0)
MCHC: 34.2 g/dL (ref 32.0–36.0)
MCV: 91.7 fL (ref 80.0–100.0)
Platelets: 204 10*3/uL (ref 150–440)
RBC: 4.3 MIL/uL — ABNORMAL LOW (ref 4.40–5.90)
RDW: 14.1 % (ref 11.5–14.5)
WBC: 7.6 10*3/uL (ref 3.8–10.6)

## 2017-02-10 LAB — BASIC METABOLIC PANEL
ANION GAP: 9 (ref 5–15)
BUN: 26 mg/dL — AB (ref 6–20)
CALCIUM: 9.1 mg/dL (ref 8.9–10.3)
CO2: 26 mmol/L (ref 22–32)
CREATININE: 1.23 mg/dL (ref 0.61–1.24)
Chloride: 105 mmol/L (ref 101–111)
GFR calc Af Amer: 58 mL/min — ABNORMAL LOW (ref >60)
GFR, EST NON AFRICAN AMERICAN: 50 mL/min — AB (ref >60)
GLUCOSE: 118 mg/dL — AB (ref 65–99)
Potassium: 4.2 mmol/L (ref 3.5–5.1)
Sodium: 140 mmol/L (ref 135–145)

## 2017-02-10 LAB — TROPONIN I

## 2017-02-10 MED ORDER — SODIUM CHLORIDE 0.9 % IV BOLUS (SEPSIS)
1000.0000 mL | Freq: Once | INTRAVENOUS | Status: AC
  Administered 2017-02-10: 1000 mL via INTRAVENOUS

## 2017-02-10 NOTE — ED Provider Notes (Signed)
[facility] Emergency Department Provider Note  ____________________________________________   First MD Initiated Contact with Patient 02/10/17 1222     (approximate)  I have reviewed the triage vital signs and the nursing notes.   HISTORY  Chief Complaint Loss of Consciousness   HPI Victor Parks is a 83 y.o. male with a history of syncope who is presenting to the hospital today after 6 upon arising on the toilet this morning. Per EMS report the patient was down for maybe several minutes when he was found. Patient does not recall the event exactly. However, he denies any complaints at this time. Denies any chest pain or shortness of breath. Denies any palpitations. His daughter is at the bedside and says that he has had several episodes similar to this in the past. No reports of the patient having any nausea or vomiting or decrease in appetite. Patient has a urostomy and is denying any abdominal pain.   Past Medical History:  Diagnosis Date  . Bladder cancer ([place])   . Decubitus ulcer   . Hypertension   . Muscle weakness   . UTI (urinary tract infection)     Patient Active Problem List   Diagnosis Date Noted  . Severe sepsis with septic shock ([place]) 06/17/2016  . Acute blood loss anemia 06/17/2016  . Septic shock ([place]) 06/16/2016  . Protein-calorie malnutrition, severe 06/15/2016  . Abdominal pain   . Dyspnea 06/13/2016  . Pressure injury of skin 06/13/2016    Past Surgical History:  Procedure Laterality Date  . ESOPHAGOGASTRODUODENOSCOPY (EGD) WITH PROPOFOL N/A 06/20/2016   Procedure: ESOPHAGOGASTRODUODENOSCOPY (EGD) WITH PROPOFOL;  Surgeon: Jonathon Bellows, MD;  Location: [facility] ENDOSCOPY;  Service: Endoscopy;  Laterality: N/A;  . ILEAL POUCH     s/p bladder cancer  . none      Prior to Admission medications   Medication Sig Start Date End Date Taking? Authorizing Provider  acetaminophen (TYLENOL) 500 MG tablet Take 500 mg by mouth 3 (three)  times daily. 6 am, 2 pm, 10 pm    [provider]  Ascorbic Acid (VITAMIN C) 1000 MG tablet Take 1,000 mg by mouth daily.    [provider]  aspirin EC 81 MG tablet Take 81 mg by mouth once. Tuesday    [provider]  Calcium Carb-Cholecalciferol (CALCIUM 1000 + D) 1000-800 MG-UNIT TABS Take 1 tablet by mouth daily.    [provider]  cephALEXin (KEFLEX) 500 MG capsule Take 1 capsule (500 mg total) by mouth 2 (two) times daily. 08/25/16   Harvest Dark, MD  cholestyramine light (PREVALITE) 4 g packet Take 4 g by mouth daily.    [provider]  ferrous sulfate 325 (65 FE) MG tablet Take 325 mg by mouth daily with breakfast.    [provider]  Fluticasone-Salmeterol (ADVAIR) 250-50 MCG/DOSE AEPB Inhale 1 puff into the lungs 2 (two) times daily.    [provider]  loperamide (IMODIUM A-D) 2 MG tablet Take 2 mg by mouth every 6 (six) hours as needed for diarrhea or loose stools.    [provider]  Menthol-Zinc Oxide (CALMOSEPTINE) 0.44-20.6 % OINT Apply 1 application topically every 12 (twelve) hours as needed. To buttocks for rash    [provider]  Multiple Vitamin (MULTIVITAMIN) tablet Take 1 tablet by mouth daily.    [provider]  Nutritional Supplements (NUTRITIONAL DRINK) LIQD Take 60 mLs by mouth 2 (two) times daily. MED PASS    [provider]  pantoprazole (PROTONIX) 40 MG tablet Take 1 tablet (40 mg total) by mouth daily. 06/22/16   Bettey Costa, MD  potassium citrate (UROCIT-K) 10 MEQ (1080 MG) SR tablet Take 10 mEq by mouth 2 (two) times daily.    [provider]  senna-docusate (SENOKOT-S) 8.6-50 MG tablet Take 1 tablet by mouth at bedtime as needed for mild constipation. 06/22/16   Bettey Costa, MD  simethicone (MYLICON) 80 MG chewable tablet Chew 80 mg by mouth every 6 (six) hours as needed for flatulence.    [provider]  sucralfate (CARAFATE) 1 g tablet  Take 1 tablet (1 g total) by mouth 4 (four) times daily -  with meals and at bedtime. 06/22/16 08/06/16  Bettey Costa, MD  traMADol (ULTRAM) 50 MG tablet Take 1 tablet (50 mg total) by mouth every 12 (twelve) hours as needed. 06/22/16   Bettey Costa, MD  vitamin B-12 (CYANOCOBALAMIN) 1000 MCG tablet Take 1,000 mcg by mouth daily.    [provider]    Allergies Ciprofloxacin  History reviewed. No pertinent family history.  Social History Social History  Substance Use Topics  . Smoking status: Former Research scientist (life sciences)  . Smokeless tobacco: Never Used  . Alcohol use No    Review of Systems  Constitutional: No fever/chills Eyes: No visual changes. ENT: No sore throat. Cardiovascular: Denies chest pain. Respiratory: Denies shortness of breath. Gastrointestinal: No abdominal pain.  No nausea, no vomiting.  No diarrhea.  No constipation. Genitourinary: Negative for dysuria. Musculoskeletal: Negative for back pain. Skin: Negative for rash. Neurological: Negative for headaches, focal weakness or numbness.   ____________________________________________   PHYSICAL EXAM:  VITAL SIGNS: ED Triage Vitals  Enc Vitals Group     BP 02/10/17 1217 105/78     Pulse --      Resp --      Temp --      Temp src --      SpO2 --      Weight --      Height 02/10/17 1218 5\' 8"  (1.727 m)     Head Circumference --      Peak Flow --      Pain Score --      Pain Loc --      Pain Edu? --      Excl. in [place]? --     Constitutional: Alert and oriented. Well appearing and in no acute distress. Eyes: Conjunctivae are normal.  Head: Atraumatic. Nose: No congestion/rhinnorhea. Mouth/Throat: Mucous membranes are moist.  Neck: No stridor.   Cardiovascular: Normal rate, regular rhythm. Grossly normal heart sounds.   Respiratory: Normal respiratory effort.  No retractions. Lungs CTAB. Gastrointestinal: Soft and nontender. No distention. Urostomy is in place with yellow urine in the bag. No surrounding  erythema, induration or tenderness. Musculoskeletal: No lower extremity tenderness nor edema.  No joint effusions. Neurologic:  Normal speech and language. No gross focal neurologic deficits are appreciated. Skin:  Skin is warm, dry and intact. No rash noted. Psychiatric: Mood and affect are normal. Speech and behavior are normal.  ____________________________________________   LABS (all labs ordered are listed, but only abnormal results are displayed)  Labs Reviewed  BASIC METABOLIC PANEL - Abnormal; Notable for the following:       Result Value   Glucose, Bld 118 (*)    BUN 26 (*)    GFR calc non Af Amer 50 (*)    GFR calc Af Amer 58 (*)    All other components within  normal limits  CBC - Abnormal; Notable for the following:    RBC 4.30 (*)    HCT 39.5 (*)    All other components within normal limits  TROPONIN I  URINALYSIS, COMPLETE (UACMP) WITH MICROSCOPIC  CBG MONITORING, ED   ____________________________________________  EKG  ED ECG REPORT I, Pilar Westergaard,  Youlanda Roys, the attending physician, personally viewed and interpreted this ECG.   Date: 02/10/2017  EKG Time: 1220  Rate: 56  Rhythm: sinus bradycardia  Axis: Normal  Intervals:first-degree A-V block , right bundle branch block  ST&T Change: No ST segment elevation or depression. No abnormal T-wave inversion. Right bundle branch block appears old and is unchanged from past EKGs on the record. ____________________________________________  RADIOLOGY  No acute finding on head CT ____________________________________________   PROCEDURES  Procedure(s) performed:   Procedures  Critical Care performed:   ____________________________________________   INITIAL IMPRESSION / ASSESSMENT AND PLAN / ED COURSE  Pertinent labs & imaging results that were available during my care of the patient were reviewed by me and considered in my medical decision making (see chart for  details).  ----------------------------------------- 2:34 PM on 02/10/2017 -----------------------------------------  Patient at this time is a symptomatic. Heart rate is in the 70s and his blood pressures in the 1 teens. Reassuring lab work. Likely vasovagal episode. I spent a results as well as the plan to the family as well as the patient and they're understanding of wine to comply. Patient will be discharged home.      ____________________________________________   FINAL CLINICAL IMPRESSION(S) / ED DIAGNOSES  Syncope    NEW MEDICATIONS STARTED DURING THIS VISIT:  New Prescriptions   No medications on file     Note:  This document was prepared using Dragon voice recognition software and may include unintentional dictation errors.     Orbie Pyo, MD 02/10/17 1435

## 2017-02-10 NOTE — ED Triage Notes (Signed)
Per EMS pt comes from [facility].  Pt was having a BM on the toilet this morning and was found down unconscious.  Staff states pt was out for a "few minutes".  Pt is A&Ox4 and reports no pain or dizziness.

## 2017-07-07 ENCOUNTER — Encounter
Admission: EM | Disposition: A | Discharge status: Skilled Nursing Facility | Payer: Self-pay | Source: Home / Self Care | Attending: Internal Medicine

## 2017-07-07 ENCOUNTER — Inpatient Hospital Stay
Admission: EM | Admit: 2017-07-07 | Discharge: 2017-07-10 | DRG: 482 | Disposition: A | Discharge status: Skilled Nursing Facility | Payer: Medicare Other | Attending: Internal Medicine | Admitting: Internal Medicine

## 2017-07-07 ENCOUNTER — Inpatient Hospital Stay: Payer: Medicare Other | Admitting: Certified Registered"

## 2017-07-07 ENCOUNTER — Other Ambulatory Visit: Payer: Self-pay

## 2017-07-07 ENCOUNTER — Inpatient Hospital Stay: Payer: Medicare Other

## 2017-07-07 ENCOUNTER — Emergency Department: Payer: Medicare Other

## 2017-07-07 DIAGNOSIS — S41112A Laceration without foreign body of left upper arm, initial encounter: Secondary | ICD-10-CM | POA: Diagnosis present

## 2017-07-07 DIAGNOSIS — F0280 Dementia in other diseases classified elsewhere without behavioral disturbance: Secondary | ICD-10-CM | POA: Diagnosis present

## 2017-07-07 DIAGNOSIS — S72009A Fracture of unspecified part of neck of unspecified femur, initial encounter for closed fracture: Secondary | ICD-10-CM

## 2017-07-07 DIAGNOSIS — Z881 Allergy status to other antibiotic agents status: Secondary | ICD-10-CM

## 2017-07-07 DIAGNOSIS — I10 Essential (primary) hypertension: Secondary | ICD-10-CM | POA: Diagnosis present

## 2017-07-07 DIAGNOSIS — R0600 Dyspnea, unspecified: Secondary | ICD-10-CM | POA: Diagnosis present

## 2017-07-07 DIAGNOSIS — Z87440 Personal history of urinary (tract) infections: Secondary | ICD-10-CM

## 2017-07-07 DIAGNOSIS — Z66 Do not resuscitate: Secondary | ICD-10-CM | POA: Diagnosis present

## 2017-07-07 DIAGNOSIS — S72002A Fracture of unspecified part of neck of left femur, initial encounter for closed fracture: Secondary | ICD-10-CM | POA: Diagnosis present

## 2017-07-07 DIAGNOSIS — W1830XA Fall on same level, unspecified, initial encounter: Secondary | ICD-10-CM | POA: Diagnosis present

## 2017-07-07 DIAGNOSIS — I959 Hypotension, unspecified: Secondary | ICD-10-CM | POA: Diagnosis present

## 2017-07-07 DIAGNOSIS — Z8551 Personal history of malignant neoplasm of bladder: Secondary | ICD-10-CM | POA: Diagnosis not present

## 2017-07-07 DIAGNOSIS — S72142A Displaced intertrochanteric fracture of left femur, initial encounter for closed fracture: Principal | ICD-10-CM | POA: Diagnosis present

## 2017-07-07 DIAGNOSIS — Z87891 Personal history of nicotine dependence: Secondary | ICD-10-CM | POA: Diagnosis not present

## 2017-07-07 DIAGNOSIS — K219 Gastro-esophageal reflux disease without esophagitis: Secondary | ICD-10-CM | POA: Diagnosis present

## 2017-07-07 DIAGNOSIS — Z7982 Long term (current) use of aspirin: Secondary | ICD-10-CM | POA: Diagnosis not present

## 2017-07-07 DIAGNOSIS — Y92121 Bathroom in nursing home as the place of occurrence of the external cause: Secondary | ICD-10-CM | POA: Diagnosis not present

## 2017-07-07 DIAGNOSIS — F0390 Unspecified dementia without behavioral disturbance: Secondary | ICD-10-CM

## 2017-07-07 HISTORY — PX: FEMUR IM NAIL: SHX1597

## 2017-07-07 LAB — CBC WITH DIFFERENTIAL/PLATELET
BASOS ABS: 0.1 10*3/uL (ref 0–0.1)
Basophils Relative: 1 %
EOS ABS: 0.2 10*3/uL (ref 0–0.7)
EOS PCT: 3 %
HCT: 41 % (ref 40.0–52.0)
HEMOGLOBIN: 13.6 g/dL (ref 13.0–18.0)
LYMPHS PCT: 31 %
Lymphs Abs: 2.9 10*3/uL (ref 1.0–3.6)
MCH: 30.9 pg (ref 26.0–34.0)
MCHC: 33.1 g/dL (ref 32.0–36.0)
MCV: 93.2 fL (ref 80.0–100.0)
MONOS PCT: 7 %
Monocytes Absolute: 0.7 10*3/uL (ref 0.2–1.0)
Neutro Abs: 5.4 10*3/uL (ref 1.4–6.5)
Neutrophils Relative %: 58 %
Platelets: 230 10*3/uL (ref 150–440)
RBC: 4.4 MIL/uL (ref 4.40–5.90)
RDW: 13.9 % (ref 11.5–14.5)
WBC: 9.3 10*3/uL (ref 3.8–10.6)

## 2017-07-07 LAB — COMPREHENSIVE METABOLIC PANEL
ALT: 14 U/L — ABNORMAL LOW (ref 17–63)
ANION GAP: 11 (ref 5–15)
AST: 22 U/L (ref 15–41)
Albumin: 3.5 g/dL (ref 3.5–5.0)
Alkaline Phosphatase: 76 U/L (ref 38–126)
BUN: 26 mg/dL — AB (ref 6–20)
CHLORIDE: 101 mmol/L (ref 101–111)
CO2: 26 mmol/L (ref 22–32)
Calcium: 9 mg/dL (ref 8.9–10.3)
Creatinine, Ser: 1.2 mg/dL (ref 0.61–1.24)
GFR calc Af Amer: 59 mL/min — ABNORMAL LOW (ref >60)
GFR calc non Af Amer: 51 mL/min — ABNORMAL LOW (ref >60)
GLUCOSE: 123 mg/dL — AB (ref 65–99)
POTASSIUM: 3.9 mmol/L (ref 3.5–5.1)
SODIUM: 138 mmol/L (ref 135–145)
Total Bilirubin: 0.6 mg/dL (ref 0.3–1.2)
Total Protein: 6.3 g/dL — ABNORMAL LOW (ref 6.5–8.1)

## 2017-07-07 LAB — CBC
HEMATOCRIT: 37.3 % — AB (ref 40.0–52.0)
HEMOGLOBIN: 12.6 g/dL — AB (ref 13.0–18.0)
MCH: 31.1 pg (ref 26.0–34.0)
MCHC: 33.9 g/dL (ref 32.0–36.0)
MCV: 91.6 fL (ref 80.0–100.0)
Platelets: 187 10*3/uL (ref 150–440)
RBC: 4.07 MIL/uL — ABNORMAL LOW (ref 4.40–5.90)
RDW: 13.5 % (ref 11.5–14.5)
WBC: 13.7 10*3/uL — AB (ref 3.8–10.6)

## 2017-07-07 LAB — TYPE AND SCREEN
ABO/RH(D): A POS
ANTIBODY SCREEN: NEGATIVE

## 2017-07-07 LAB — PROTIME-INR
INR: 1.02
PROTHROMBIN TIME: 13.3 s (ref 11.4–15.2)

## 2017-07-07 LAB — CREATININE, SERUM
Creatinine, Ser: 1.14 mg/dL (ref 0.61–1.24)
GFR calc Af Amer: >60 mL/min (ref >60)
GFR calc non Af Amer: 54 mL/min — ABNORMAL LOW (ref >60)

## 2017-07-07 LAB — APTT: aPTT: 26 seconds (ref 24–36)

## 2017-07-07 SURGERY — INSERTION, INTRAMEDULLARY ROD, FEMUR
Anesthesia: Spinal | Site: Hip | Laterality: Left | Wound class: Clean

## 2017-07-07 MED ORDER — SODIUM CHLORIDE 0.9 % IV SOLN
1000.0000 mL | Freq: Once | INTRAVENOUS | Status: AC
  Administered 2017-07-07: 1000 mL via INTRAVENOUS

## 2017-07-07 MED ORDER — BUPIVACAINE HCL (PF) 0.5 % IJ SOLN
INTRAMUSCULAR | Status: AC
  Filled 2017-07-07: qty 10

## 2017-07-07 MED ORDER — HYDROCODONE-ACETAMINOPHEN 5-325 MG PO TABS
1.0000 | ORAL_TABLET | Freq: Four times a day (QID) | ORAL | Status: DC | PRN
  Administered 2017-07-08: 2 via ORAL
  Filled 2017-07-07: qty 2

## 2017-07-07 MED ORDER — FENTANYL CITRATE (PF) 100 MCG/2ML IJ SOLN: 25.0000 ug | INTRAMUSCULAR | Status: DC | PRN

## 2017-07-07 MED ORDER — LIDOCAINE HCL (PF) 2 % IJ SOLN
INTRAMUSCULAR | Status: DC | PRN
  Administered 2017-07-07: 50 mg

## 2017-07-07 MED ORDER — ONDANSETRON HCL 4 MG/2ML IJ SOLN
4.0000 mg | Freq: Once | INTRAMUSCULAR | Status: AC
  Administered 2017-07-07: 4 mg via INTRAVENOUS
  Filled 2017-07-07: qty 2

## 2017-07-07 MED ORDER — SODIUM CHLORIDE 0.9 % IV SOLN
INTRAVENOUS | Status: DC | PRN
  Administered 2017-07-07: 30 ug/min via INTRAVENOUS

## 2017-07-07 MED ORDER — ONDANSETRON HCL 4 MG/2ML IJ SOLN
4.0000 mg | Freq: Four times a day (QID) | INTRAMUSCULAR | Status: DC | PRN
  Administered 2017-07-08 (×2): 4 mg via INTRAVENOUS
  Filled 2017-07-07 (×2): qty 2

## 2017-07-07 MED ORDER — PANTOPRAZOLE SODIUM 40 MG IV SOLR
40.0000 mg | Freq: Two times a day (BID) | INTRAVENOUS | Status: DC
  Administered 2017-07-07 – 2017-07-10 (×6): 40 mg via INTRAVENOUS
  Filled 2017-07-07 (×7): qty 40

## 2017-07-07 MED ORDER — KETAMINE HCL 50 MG/ML IJ SOLN
INTRAMUSCULAR | Status: DC | PRN
  Administered 2017-07-07: 25 mg via INTRAVENOUS

## 2017-07-07 MED ORDER — ONDANSETRON HCL 4 MG/2ML IJ SOLN: 4.0000 mg | Freq: Once | INTRAMUSCULAR | Status: DC | PRN

## 2017-07-07 MED ORDER — HEPARIN SODIUM (PORCINE) 5000 UNIT/ML IJ SOLN
5000.0000 [IU] | Freq: Three times a day (TID) | INTRAMUSCULAR | Status: DC
  Administered 2017-07-07: 5000 [IU] via SUBCUTANEOUS
  Filled 2017-07-07: qty 1

## 2017-07-07 MED ORDER — BISACODYL 10 MG RE SUPP
10.0000 mg | Freq: Every day | RECTAL | Status: DC | PRN
  Administered 2017-07-10: 10 mg via RECTAL
  Filled 2017-07-07: qty 1

## 2017-07-07 MED ORDER — PROPOFOL 500 MG/50ML IV EMUL
INTRAVENOUS | Status: DC | PRN
  Administered 2017-07-07: 20 ug/kg/min via INTRAVENOUS

## 2017-07-07 MED ORDER — BUPIVACAINE HCL (PF) 0.5 % IJ SOLN
INTRAMUSCULAR | Status: DC | PRN
  Administered 2017-07-07: 3 mL via INTRATHECAL

## 2017-07-07 MED ORDER — CEFAZOLIN SODIUM-DEXTROSE 2-3 GM-%(50ML) IV SOLR
INTRAVENOUS | Status: DC | PRN
  Administered 2017-07-07: 2 g via INTRAVENOUS

## 2017-07-07 MED ORDER — IPRATROPIUM-ALBUTEROL 0.5-2.5 (3) MG/3ML IN SOLN: 3.0000 mL | Freq: Four times a day (QID) | RESPIRATORY_TRACT | Status: DC | PRN

## 2017-07-07 MED ORDER — ACETAMINOPHEN 650 MG RE SUPP: 650.0000 mg | Freq: Four times a day (QID) | RECTAL | Status: DC | PRN

## 2017-07-07 MED ORDER — ASPIRIN EC 81 MG PO TBEC
81.0000 mg | DELAYED_RELEASE_TABLET | Freq: Once | ORAL | Status: AC
  Administered 2017-07-07: 81 mg via ORAL
  Filled 2017-07-07: qty 1

## 2017-07-07 MED ORDER — POTASSIUM CITRATE ER 10 MEQ (1080 MG) PO TBCR
10.0000 meq | EXTENDED_RELEASE_TABLET | Freq: Two times a day (BID) | ORAL | Status: DC
  Administered 2017-07-08 – 2017-07-10 (×5): 10 meq via ORAL
  Filled 2017-07-07 (×7): qty 1

## 2017-07-07 MED ORDER — DOCUSATE SODIUM 100 MG PO CAPS
100.0000 mg | ORAL_CAPSULE | Freq: Two times a day (BID) | ORAL | Status: DC
  Administered 2017-07-08 – 2017-07-09 (×5): 100 mg via ORAL
  Filled 2017-07-07 (×6): qty 1

## 2017-07-07 MED ORDER — FERROUS SULFATE 325 (65 FE) MG PO TABS
325.0000 mg | ORAL_TABLET | Freq: Every day | ORAL | Status: DC
  Administered 2017-07-08 – 2017-07-10 (×3): 325 mg via ORAL
  Filled 2017-07-07 (×3): qty 1

## 2017-07-07 MED ORDER — EPHEDRINE SULFATE 50 MG/ML IJ SOLN
INTRAMUSCULAR | Status: AC
  Filled 2017-07-07: qty 1

## 2017-07-07 MED ORDER — SODIUM CHLORIDE 0.9 % IV SOLN
INTRAVENOUS | Status: DC
Start: 2017-07-07 — End: 2017-07-08
  Administered 2017-07-07: 16:00:00 via INTRAVENOUS

## 2017-07-07 MED ORDER — CEFAZOLIN SODIUM 1 G IJ SOLR
INTRAMUSCULAR | Status: AC
  Filled 2017-07-07: qty 20

## 2017-07-07 MED ORDER — EPHEDRINE SULFATE 50 MG/ML IJ SOLN
INTRAMUSCULAR | Status: DC | PRN
  Administered 2017-07-07: 10 mg via INTRAVENOUS
  Administered 2017-07-07: 5 mg via INTRAVENOUS

## 2017-07-07 MED ORDER — IPRATROPIUM-ALBUTEROL 0.5-2.5 (3) MG/3ML IN SOLN
3.0000 mL | Freq: Four times a day (QID) | RESPIRATORY_TRACT | Status: DC
  Administered 2017-07-07: 3 mL via RESPIRATORY_TRACT
  Filled 2017-07-07: qty 3

## 2017-07-07 MED ORDER — FENTANYL CITRATE (PF) 100 MCG/2ML IJ SOLN
50.0000 ug | INTRAMUSCULAR | Status: DC | PRN
  Administered 2017-07-07: 50 ug via INTRAVENOUS
  Filled 2017-07-07: qty 2

## 2017-07-07 MED ORDER — ACETAMINOPHEN 325 MG PO TABS
650.0000 mg | ORAL_TABLET | Freq: Four times a day (QID) | ORAL | Status: DC | PRN
  Administered 2017-07-08 – 2017-07-09 (×3): 650 mg via ORAL
  Filled 2017-07-07 (×3): qty 2

## 2017-07-07 MED ORDER — LIDOCAINE HCL (PF) 2 % IJ SOLN
INTRAMUSCULAR | Status: AC
Start: 2017-07-07 — End: ?
  Filled 2017-07-07: qty 10

## 2017-07-07 MED ORDER — PROPOFOL 10 MG/ML IV BOLUS
INTRAVENOUS | Status: AC
Start: 2017-07-07 — End: ?
  Filled 2017-07-07: qty 20

## 2017-07-07 MED ORDER — PHENYLEPHRINE HCL 10 MG/ML IJ SOLN
INTRAMUSCULAR | Status: DC | PRN
  Administered 2017-07-07: 100 ug via INTRAVENOUS
  Administered 2017-07-07: 200 ug via INTRAVENOUS
  Administered 2017-07-07 (×6): 100 ug via INTRAVENOUS

## 2017-07-07 MED ORDER — DEXTROSE 5 % IV SOLN
1.0000 g | INTRAVENOUS | Status: DC
  Administered 2017-07-08: 1 g via INTRAVENOUS
  Filled 2017-07-07 (×3): qty 10

## 2017-07-07 MED ORDER — PROPOFOL 10 MG/ML IV BOLUS
INTRAVENOUS | Status: DC | PRN
  Administered 2017-07-07 (×3): 20 mg via INTRAVENOUS

## 2017-07-07 MED ORDER — CEFTRIAXONE SODIUM IN DEXTROSE 20 MG/ML IV SOLN
1.0000 g | INTRAVENOUS | Status: DC
  Filled 2017-07-07: qty 50

## 2017-07-07 MED ORDER — SODIUM CHLORIDE 0.9 % IV SOLN
INTRAVENOUS | Status: DC | PRN
  Administered 2017-07-07: 21:00:00 via INTRAVENOUS

## 2017-07-07 MED ORDER — ONDANSETRON HCL 4 MG PO TABS: 4.0000 mg | ORAL_TABLET | Freq: Four times a day (QID) | ORAL | Status: DC | PRN

## 2017-07-07 MED ORDER — MORPHINE SULFATE (PF) 2 MG/ML IV SOLN
2.0000 mg | INTRAVENOUS | Status: DC | PRN
  Administered 2017-07-07 – 2017-07-08 (×2): 2 mg via INTRAVENOUS
  Filled 2017-07-07 (×2): qty 1

## 2017-07-07 SURGICAL SUPPLY — 37 items
BIT DRILL FLUTED FEMUR 4.2/3 (BIT) ×3 IMPLANT
BLADE TFNA HELICAL 100 STRL (MISCELLANEOUS) ×3 IMPLANT
BNDG COHESIVE 4X5 TAN STRL (GAUZE/BANDAGES/DRESSINGS) IMPLANT
BNDG COHESIVE 6X5 TAN STRL LF (GAUZE/BANDAGES/DRESSINGS) ×6 IMPLANT
CANISTER SUCT 1200ML W/VALVE (MISCELLANEOUS) ×3 IMPLANT
CHLORAPREP W/TINT 26ML (MISCELLANEOUS) ×3 IMPLANT
DRAPE C-ARM XRAY 36X54 (DRAPES) ×3 IMPLANT
DRAPE C-ARMOR (DRAPES) ×3 IMPLANT
DRAPE POUCH INSTRU U-SHP 10X18 (DRAPES) ×3 IMPLANT
DRAPE SHEET LG 3/4 BI-LAMINATE (DRAPES) IMPLANT
DRAPE U-SHAPE 47X51 STRL (DRAPES) IMPLANT
DRSG OPSITE POSTOP 4X6 (GAUZE/BANDAGES/DRESSINGS) ×6 IMPLANT
ELECT REM PT RETURN 9FT ADLT (ELECTROSURGICAL) ×3
ELECTRODE REM PT RTRN 9FT ADLT (ELECTROSURGICAL) ×1 IMPLANT
GLOVE INDICATOR 8.0 STRL GRN (GLOVE) ×3 IMPLANT
GLOVE SURG ORTHO 8.0 STRL STRW (GLOVE) ×3 IMPLANT
GOWN STRL REUS W/ TWL LRG LVL3 (GOWN DISPOSABLE) ×1 IMPLANT
GOWN STRL REUS W/ TWL XL LVL3 (GOWN DISPOSABLE) ×2 IMPLANT
GOWN STRL REUS W/TWL LRG LVL3 (GOWN DISPOSABLE) ×2
GOWN STRL REUS W/TWL XL LVL3 (GOWN DISPOSABLE) ×4
GUIDEWIRE 3.2X400 (WIRE) ×3 IMPLANT
KIT RM TURNOVER CYSTO AR (KITS) ×3 IMPLANT
MAT ABSORB  FLUID 56X50 GRAY (MISCELLANEOUS) ×2
MAT ABSORB FLUID 56X50 GRAY (MISCELLANEOUS) ×1 IMPLANT
NAIL TROCH FIX 11X235 LT 130 (Nail) ×3 IMPLANT
NS IRRIG 1000ML POUR BTL (IV SOLUTION) IMPLANT
PACK HIP COMPR (MISCELLANEOUS) ×3 IMPLANT
PAD CAST CTTN 4X4 STRL (SOFTGOODS) ×2 IMPLANT
PADDING CAST COTTON 4X4 STRL (SOFTGOODS) ×4
SCREW LOCK T25 FT 36X5X4.3X (Screw) ×1 IMPLANT
SCREW LOCKING 5.0X36MM (Screw) ×2 IMPLANT
STAPLER SKIN PROX 35W (STAPLE) ×3 IMPLANT
SUT VIC AB 0 CT1 36 (SUTURE) IMPLANT
SUT VIC AB 1 CT1 36 (SUTURE) ×3 IMPLANT
SUT VIC AB 2-0 CT1 27 (SUTURE) ×4
SUT VIC AB 2-0 CT1 TAPERPNT 27 (SUTURE) ×2 IMPLANT
TOWEL OR 17X26 4PK STRL BLUE (TOWEL DISPOSABLE) IMPLANT

## 2017-07-07 NOTE — Progress Notes (Signed)
LCSW consulted with Dr Doy Hutching and due to time constraints will do patient assessment tomorrow. Patient from [place].  [employer] LCSW [phone]

## 2017-07-07 NOTE — Consult Note (Addendum)
Reason for Consult: Fracture left hip Referring Physician: Dr. Evelina Bucy Suess is an 83 y.o. male.  HPI: The patient was found down in the bathroom at his extended care facility today.  The patient does not recall the details of his fall.  He had immediate and severe pain in the left hip and was unable to bear weight.  He now has constant discomfort with episodes of spasm and then also severe pain with any attempts to move the left lower extremity.  He denies any prior problems with the left hip.  He does have a history of right hip fracture evidenced on x-ray.  The patient normally ambulates utilizing a Kymora Sciara.  He will now be admitted for surgical stabilization of his left intertrochanteric hip fracture.  Past Medical History:  Diagnosis Date  . Bladder cancer ([place])   . Decubitus ulcer   . Hypertension   . Muscle weakness   . UTI (urinary tract infection)     Past Surgical History:  Procedure Laterality Date  . ESOPHAGOGASTRODUODENOSCOPY (EGD) WITH PROPOFOL N/A 06/20/2016   Procedure: ESOPHAGOGASTRODUODENOSCOPY (EGD) WITH PROPOFOL;  Surgeon: Jonathon Bellows, MD;  Location: [facility] ENDOSCOPY;  Service: Endoscopy;  Laterality: N/A;  . ILEAL POUCH     s/p bladder cancer  . none      History reviewed. No pertinent family history.  Social History: The patient is a former smoker.  He denies use of alcohol or drugs.  The patient resides at [facility] [place].  The patient's wife lives there as well. Allergies:  Allergies  Allergen Reactions  . Ciprofloxacin Anaphylaxis    Medications: I have reviewed the patient's current medications.  Results for orders placed or performed during the hospital encounter of 07/07/17 (from the past 48 hour(s))  CBC WITH DIFFERENTIAL     Status: None   Collection Time: 07/07/17  9:45 AM  Result Value Ref Range   WBC 9.3 3.8 - 10.6 K/uL   RBC 4.40 4.40 - 5.90 MIL/uL   Hemoglobin 13.6 13.0 - 18.0 g/dL   HCT 41.0 40.0 - 52.0 %    MCV 93.2 80.0 - 100.0 fL   MCH 30.9 26.0 - 34.0 pg   MCHC 33.1 32.0 - 36.0 g/dL   RDW 13.9 11.5 - 14.5 %   Platelets 230 150 - 440 K/uL   Neutrophils Relative % 58 %   Neutro Abs 5.4 1.4 - 6.5 K/uL   Lymphocytes Relative 31 %   Lymphs Abs 2.9 1.0 - 3.6 K/uL   Monocytes Relative 7 %   Monocytes Absolute 0.7 0.2 - 1.0 K/uL   Eosinophils Relative 3 %   Eosinophils Absolute 0.2 0 - 0.7 K/uL   Basophils Relative 1 %   Basophils Absolute 0.1 0 - 0.1 K/uL  Protime-INR     Status: None   Collection Time: 07/07/17  9:45 AM  Result Value Ref Range   Prothrombin Time 13.3 11.4 - 15.2 seconds   INR 1.02   Type and screen [facility]     Status: None (Preliminary result)   Collection Time: 07/07/17  9:45 AM  Result Value Ref Range   ABO/RH(D) PENDING    Antibody Screen PENDING    Sample Expiration 07/10/2017   Comprehensive metabolic panel     Status: Abnormal   Collection Time: 07/07/17  9:45 AM  Result Value Ref Range   Sodium 138 135 - 145 mmol/L   Potassium 3.9 3.5 - 5.1 mmol/L   Chloride 101  101 - 111 mmol/L   CO2 26 22 - 32 mmol/L   Glucose, Bld 123 (H) 65 - 99 mg/dL   BUN 26 (H) 6 - 20 mg/dL   Creatinine, Ser 1.20 0.61 - 1.24 mg/dL   Calcium 9.0 8.9 - 10.3 mg/dL   Total Protein 6.3 (L) 6.5 - 8.1 g/dL   Albumin 3.5 3.5 - 5.0 g/dL   AST 22 15 - 41 U/L   ALT 14 (L) 17 - 63 U/L   Alkaline Phosphatase 76 38 - 126 U/L   Total Bilirubin 0.6 0.3 - 1.2 mg/dL   GFR calc non Af Amer 51 (L) >60 mL/min   GFR calc Af Amer 59 (L) >60 mL/min    Comment: (NOTE) The eGFR has been calculated using the CKD EPI equation. This calculation has not been validated in all clinical situations. eGFR's persistently <60 mL/min signify possible Chronic Kidney Disease.    Anion gap 11 5 - 15  APTT     Status: None   Collection Time: 07/07/17  9:45 AM  Result Value Ref Range   aPTT 26 24 - 36 seconds    Dg Chest 1 View  Result Date: 07/07/2017 CLINICAL DATA:  Patient found  down today after a fall. Left hip fracture. EXAM: CHEST 1 VIEW COMPARISON:  Single-view of the chest 06/16/2016. FINDINGS: Minimal atelectasis is seen in the left lung base. Lungs are otherwise clear. Heart size is normal. No pneumothorax or pleural fluid. Aortic atherosclerosis is seen. No acute bony abnormality. IMPRESSION: No acute disease. Atherosclerosis. Electronically Signed   By: Inge Rise M.D.   On: 07/07/2017 11:28   Dg Hip Unilat With Pelvis 2-3 Views Left  Result Date: 07/07/2017 CLINICAL DATA:  Deformity. EXAM: DG HIP (WITH OR WITHOUT PELVIS) 2-3V LEFT COMPARISON:  None. FINDINGS: There is a nondisplaced, non comminuted and nonangulated intertrochanteric fracture of the left proximal femur. No other acute fracture. Old right proximal femur fracture has been reduced with a compression screw and lateral fixation plate. Hip joints, SI joints and symphysis pubis are normally aligned. Bones are diffusely demineralized. There are numerous pelvis vascular clips consistent with a previous radical prostatectomy. IMPRESSION: Nondisplaced, non comminuted and nonangulated intertrochanteric proximal left femur fracture. Electronically Signed   By: Lajean Manes M.D.   On: 07/07/2017 11:05    ROS the patient denies any recent fever night sweats or unexplained weight loss.  He denies headache and dizziness.  He has not had chest pain or shortness of breath.  He has a history of abdominal surgeries as well as a history of carcinoma of the bladder. He does not report any musculoskeletal problems however he seems to have forgotten that he has had a right hip fracture treated in the past.  He denies numbness tingling and weakness.   Blood pressure 91/75, pulse 96, temperature 98.2 F (36.8 C), temperature source Rectal, resp. rate (!) 21, weight 58.1 kg (128 lb), SpO2 99 %. Physical Exam the patient appears well-nourished and developed  HEENT: The patient is normocephalic extraocular muscles are  intact. Neck: Supple and nontender Chest: Clear COR: Regular rate and rhythm ABD: Soft and nontender. Musculoskeletal: There is shortening and external rotation of the left lower extremity.  He is tender about the left hip as expected.  There is marked pain with any movement of his left hip. The skin about the left hip is intact. The patient has full range of motion of his right lower extremity as well as both upper  extremities. Neuro: Light touch sensation is intact throughout.  Motor function intact throughout with the exception of the limitations due to pain at the left hip. Psyche: It is apparent that he has short-term memory loss.  He is a bit cantankerous at this time.  He is oriented to person.  Assessment/Plan: Intertrochanteric fracture of the left hip.  Case discussed with the hospitalist service and it is felt that he is optimized for surgery.    I discussed the fracture and the recommended treatment with the patient in detail but his understanding is limited.  I then spoke by phone with the patient's daughter Lenna Sciara who is his POA.  We discussed the fracture and the recommended treatment.  We reviewed the risks complications and expected benefits of the procedure.  The informed consent process was completed by phone with 2 of the orthopedic nurses serving as witness.  Blood consent was completed as well.  The patient's daughter also called the patient's spouse who also lives at [place].  They decided they would like for me to proceed this evening with stabilization of the hip if feasible.  They would, of course, prefer to be here but with the weather as it is now and the uncertainty of what it will be tomorrow, they would like for me to proceed.  Stark Bray 07/07/2017, 12:35 PM

## 2017-07-07 NOTE — Anesthesia Post-op Follow-up Note (Signed)
Anesthesia QCDR form completed.        

## 2017-07-07 NOTE — Progress Notes (Signed)
PT Cancellation Note  Patient Details Name: Victor Parks MRN: [id number] DOB: 08-09-1923   Cancelled Treatment:    Reason Eval/Treat Not Completed: Medical issues which prohibited therapy(Consult received and chart reviewed.  Patient noted with acute L hip fracture, pending ortho consult.  Will hold evaluation until consult complete/patient cleared for activity.  If patient undergoes surgical repair, will require new PT order to resume services.  Will follow.)   Hedi Barkan H. Owens Shark, PT, DPT, NCS 07/07/17, 12:21 PM [phone]

## 2017-07-07 NOTE — ED Notes (Signed)
Lab called and stated that pt needed a recollect on type and screen from 2 hours ago - advised them that pt was on the way to floor at this time and I was not aware that recollect was needed

## 2017-07-07 NOTE — ED Provider Notes (Signed)
[facility] Emergency Department Provider Note   ____________________________________________    I have reviewed the triage vital signs and the nursing notes.   HISTORY  Chief Complaint Fall and Hip Pain  History limited by dementia   HPI Victor Parks is a 83 y.o. male presents via EMS with reported history of dementia from nursing home after being found down in the bathroom.  Patient does not remember what happened but complains of severe pain in his left hip.  EMS also notes skin tear to the left arm.  No head injury reported.   Past Medical History:  Diagnosis Date  . Bladder cancer ([place])   . Decubitus ulcer   . Hypertension   . Muscle weakness   . UTI (urinary tract infection)     Patient Active Problem List   Diagnosis Date Noted  . Closed left hip fracture ([place]) 07/07/2017  . Hypotension 07/07/2017  . 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 Severe sepsis with septic shock ([place]) 06/17/2016  . Acute blood loss anemia 06/17/2016  . Septic shock ([place]) 06/16/2016  . Protein-calorie malnutrition, severe 06/15/2016  . Abdominal pain   . Dyspnea 06/13/2016  . Pressure injury of skin 06/13/2016    Past Surgical History:  Procedure Laterality Date  . ESOPHAGOGASTRODUODENOSCOPY (EGD) WITH PROPOFOL N/A 06/20/2016   Procedure: ESOPHAGOGASTRODUODENOSCOPY (EGD) WITH PROPOFOL;  Surgeon: Jonathon Bellows, MD;  Location: [facility] ENDOSCOPY;  Service: Endoscopy;  Laterality: N/A;  . ILEAL POUCH     s/p bladder cancer  . none      Prior to Admission medications   Medication Sig Start Date End Date Taking? Authorizing Provider  Calcium Carb-Cholecalciferol (CALCIUM 1000 + D) 1000-800 MG-UNIT TABS Take 1 tablet by mouth daily.   Yes [provider]  ferrous sulfate 325 (65 FE) MG tablet Take 325 mg by mouth daily with breakfast.   Yes [provider]  folic acid-vitamin b complex-vitamin c-selenium-zinc (DIALYVITE) 3 MG TABS tablet Take 1  tablet by mouth daily.   Yes [provider]  pantoprazole (PROTONIX) 40 MG tablet Take 1 tablet (40 mg total) by mouth daily. Patient taking differently: Take 40 mg by mouth 2 (two) times daily.  06/22/16  Yes Mody, Ulice Bold, MD  potassium citrate (UROCIT-K) 10 MEQ (1080 MG) SR tablet Take 10 mEq by mouth 2 (two) times daily.   Yes [provider]  acetaminophen (TYLENOL) 500 MG tablet Take 500 mg by mouth 3 (three) times daily. 6 am, 2 pm, 10 pm    [provider]  aspirin EC 81 MG tablet Take 81 mg by mouth once. Tuesday    [provider]  loperamide (IMODIUM A-D) 2 MG tablet Take 2 mg by mouth every 6 (six) hours as needed for diarrhea or loose stools.    [provider]  Menthol-Zinc Oxide (CALMOSEPTINE) 0.44-20.6 % OINT Apply 1 application topically every 12 (twelve) hours as needed. To buttocks for rash    [provider]  Nutritional Supplements (NUTRITIONAL DRINK) LIQD Take 60 mLs by mouth 2 (two) times daily. MED PASS    [provider]  senna-docusate (SENOKOT-S) 8.6-50 MG tablet Take 1 tablet by mouth at bedtime as needed for mild constipation. 06/22/16   Bettey Costa, MD  simethicone (MYLICON) 80 MG chewable tablet Chew 80 mg by mouth every 6 (six) hours as needed for flatulence.    [provider]     Allergies Ciprofloxacin  History reviewed. No pertinent family history.  Social  History Social History   Tobacco Use  . Smoking status: Former Research scientist (life sciences)  . Smokeless tobacco: Never Used  Substance Use Topics  . Alcohol use: No  . Drug use: No    Review of Systems  Constitutional: No fever/chills Eyes: No visual changes.  ENT: No sore throat. Cardiovascular: Denies chest pain. Respiratory: Denies shortness of breath. Gastrointestinal: No abdominal pain.  No nausea, no vomiting.   Genitourinary: Negative for dysuria. Musculoskeletal: Denies back pain, left hip pain Skin: Negative for rash. Neurological:  Negative for headaches or weakness   ____________________________________________   PHYSICAL EXAM:  VITAL SIGNS: ED Triage Vitals  Enc Vitals Group     BP 07/07/17 0949 (!) 86/35     Pulse Rate 07/07/17 0949 (!) 52     Resp 07/07/17 0949 (!) 24     Temp 07/07/17 1013 98.2 F (36.8 C)     Temp Source 07/07/17 1013 Rectal     SpO2 07/07/17 0949 93 %     Weight 07/07/17 0949 58.1 kg (128 lb)     Height --      Head Circumference --      Peak Flow --      Pain Score --      Pain Loc --      Pain Edu? --      Excl. in [place]? --     Constitutional: Alert . No acute distress. Pleasant and interactive Eyes: Conjunctivae are normal.  Head: Atraumatic.  Mouth/Throat: Mucous membranes are moist.    Cardiovascular: Normal rate, regular rhythm. Grossly normal heart sounds.  Good peripheral circulation. Respiratory: Normal respiratory effort.  No retractions. Lungs CTAB. Gastrointestinal: Soft and nontender. No distention.  No CVA tenderness.  Ostomy, pink nontender Genitourinary: deferred Musculoskeletal:   Warm and well perfused Neurologic:  Normal speech and language. No gross focal neurologic deficits are appreciated.  Skin:  Skin is warm, dry and intact. No rash noted. Psychiatric: Mood and affect are normal. Speech and behavior are normal.  ____________________________________________   LABS (all labs ordered are listed, but only abnormal results are displayed)  Labs Reviewed  COMPREHENSIVE METABOLIC PANEL - Abnormal; Notable for the following components:      Result Value   Glucose, Bld 123 (*)    BUN 26 (*)    Total Protein 6.3 (*)    ALT 14 (*)    GFR calc non Af Amer 51 (*)    GFR calc Af Amer 59 (*)    All other components within normal limits  CBC WITH DIFFERENTIAL/PLATELET  PROTIME-INR  APTT  URINALYSIS, COMPLETE (UACMP) WITH MICROSCOPIC  TYPE AND SCREEN  TYPE AND SCREEN   ____________________________________________  EKG  ED ECG REPORT I, Lavonia Drafts, the attending physician, personally viewed and interpreted this ECG.  Date: 07/07/2017 EKG Time: 9:45 AM Rate: 52 Rhythm: Sinus bradycardia QRS Axis: normal Intervals: Right bundle branch block ST/T Wave abnormalities: normal Narrative Interpretation: no evidence of acute ischemia  ____________________________________________  RADIOLOGY  intertroch hip fx ____________________________________________   PROCEDURES  Procedure(s) performed: No  Procedures   Critical Care performed: No ____________________________________________   INITIAL IMPRESSION / ASSESSMENT AND PLAN / ED COURSE  Pertinent labs & imaging results that were available during my care of the patient were reviewed by me and considered in my medical decision making (see chart for details).  Patient presents after a fall.  He is hypotensive.  We will start IV fluids, his exam is consistent with left hip fracture.  IV fentanyl as needed for pain labs pending.  Xray c/w left intertrochanteric hip fx. Discussed with:  Patient's daughter and POA: [phone] (landline)  Consulted Dr. Gilford Rile of Orthopedics  Admitted to IM    ____________________________________________   FINAL CLINICAL IMPRESSION(S) / ED DIAGNOSES  Final diagnoses:  Closed fracture of left hip, initial encounter [facility])        Note:  This document was prepared using Dragon voice recognition software and may include unintentional dictation errors.    Lavonia Drafts, MD 07/07/17 1125

## 2017-07-07 NOTE — ED Notes (Signed)
EDP aware that foley catheter was unable to be placed at this time.

## 2017-07-07 NOTE — Anesthesia Preprocedure Evaluation (Signed)
Anesthesia Evaluation  Patient identified by MRN, date of birth, ID band Patient awake    Reviewed: Allergy & Precautions, NPO status , Patient's Chart, lab work & pertinent test results  History of Anesthesia Complications Negative for: history of anesthetic complications  Airway Mallampati: III  TM Distance: >3 FB Neck ROM: Full    Dental  (+) Poor Dentition   Pulmonary neg sleep apnea, neg COPD, former smoker,    breath sounds clear to auscultation- rhonchi (-) wheezing      Cardiovascular hypertension, Pt. on medications (-) CAD, (-) Past MI, (-) Cardiac Stents and (-) CABG  Rhythm:Regular Rate:Normal - Systolic murmurs and - Diastolic murmurs    Neuro/Psych Dementia  negative psych ROS   GI/Hepatic negative GI ROS, Neg liver ROS,   Endo/Other  negative endocrine ROSneg diabetes  Renal/GU negative Renal ROS     Musculoskeletal negative musculoskeletal ROS (+)   Abdominal (+) - obese,   Peds  Hematology  (+) anemia ,   Anesthesia Other Findings Past Medical History: No date: Bladder cancer (HCC) No date: Decubitus ulcer No date: Hypertension No date: Muscle weakness No date: UTI (urinary tract infection)   Reproductive/Obstetrics                             Lab Results  Component Value Date   WBC 13.7 (H) 07/07/2017   HGB 12.6 (L) 07/07/2017   HCT 37.3 (L) 07/07/2017   MCV 91.6 07/07/2017   PLT 187 07/07/2017    Anesthesia Physical Anesthesia Plan  ASA: III  Anesthesia Plan: Spinal   Post-op Pain Management:    Induction:   PONV Risk Score and Plan: 1 and Propofol infusion  Airway Management Planned: Natural Airway  Additional Equipment:   Intra-op Plan:   Post-operative Plan:   Informed Consent: I have reviewed the patients History and Physical, chart, labs and discussed the procedure including the risks, benefits and alternatives for the proposed  anesthesia with the patient or authorized representative who has indicated his/her understanding and acceptance.   Dental advisory given  Plan Discussed with: CRNA and Anesthesiologist  Anesthesia Plan Comments:         Anesthesia Quick Evaluation

## 2017-07-07 NOTE — Transfer of Care (Signed)
Immediate Anesthesia Transfer of Care Note  Patient: Victor Parks  Procedure(s) Performed: INTRAMEDULLARY (IM) NAIL FEMORAL (Left Hip)  Patient Location: PACU  Anesthesia Type:Spinal  Level of Consciousness: awake and alert   Airway & Oxygen Therapy: Patient Spontanous Breathing and Patient connected to nasal cannula oxygen  Post-op Assessment: Report given to RN and Post -op Vital signs reviewed and stable  Post vital signs: Reviewed  Last Vitals:  Vitals:   07/07/17 2000 07/07/17 2245  BP: 100/68 (!) 96/45  Pulse: 86 76  Resp: 20 14  Temp: 36.7 C   SpO2: 96% 95%    Last Pain:  Vitals:   07/07/17 2000  TempSrc: Axillary  PainSc: Asleep         Complications: No apparent anesthesia complications

## 2017-07-07 NOTE — Progress Notes (Signed)
07/07/2017 6:51 PM  Spoke with surgeon Dr. Gilford Rile about pt's planned surgery this evening.  MD thought pt was NPO, but pt received meal tray.  Did not appear to have eaten but pt has drank ~5oz of tea.  Dr. Gilford Rile will speak with anaesthesia to see if surgery mst be delayed.  Dola Argyle, RN

## 2017-07-07 NOTE — ED Triage Notes (Addendum)
Pt found on floor at [facility]. Pt does not remember fall. Pt hypotensive with EMS. C/o left hip pain, shortened at rotated upon arrival. Abrasion and skin tear to left forearm.

## 2017-07-07 NOTE — H&P (Signed)
History and Physical    Victor Parks RKY:[id number] DOB: Dec 05, 1923 DOA: 07/07/2017  Referring physician: Dr.Kinner PCP: [name], MD  Specialists: none  Chief Complaint: fall with hip pain  HPI: Victor Parks is a 83 y.o. male has a past medical history significant for recurrent UTI's, chronic DOE, HTN, and semile dementia now with mechanical fall and left hip pain. In ER, pt was initiaaly hypotensive. Hip fracture noted on x-ray. He is now admitted. Denies CP. No N/V/D. Denies syncope  Review of Systems: The patient denies anorexia, fever, weight loss,, vision loss, decreased hearing, hoarseness, chest pain, syncope, dyspnea on exertion, peripheral edema, balance deficits, hemoptysis, abdominal pain, melena, hematochezia, severe indigestion/heartburn, hematuria, incontinence, genital sores, muscle weakness, suspicious skin lesions, transient blindness,depression, unusual weight change, abnormal bleeding, enlarged lymph nodes, angioedema, and breast masses.   Past Medical History:  Diagnosis Date  . Bladder cancer ([place])   . Decubitus ulcer   . Hypertension   . Muscle weakness   . UTI (urinary tract infection)    Past Surgical History:  Procedure Laterality Date  . ESOPHAGOGASTRODUODENOSCOPY (EGD) WITH PROPOFOL N/A 06/20/2016   Procedure: ESOPHAGOGASTRODUODENOSCOPY (EGD) WITH PROPOFOL;  Surgeon: Jonathon Bellows, MD;  Location: [facility] ENDOSCOPY;  Service: Endoscopy;  Laterality: N/A;  . ILEAL POUCH     s/p bladder cancer  . none     Social History:  reports that he has quit smoking. he has never used smokeless tobacco. He reports that he does not drink alcohol or use drugs.  Allergies  Allergen Reactions  . Ciprofloxacin Anaphylaxis    History reviewed. No pertinent family history.  Prior to Admission medications   Medication Sig Start Date End Date Taking? Authorizing Provider  Calcium Carb-Cholecalciferol (CALCIUM 1000 + D) 1000-800 MG-UNIT TABS Take 1 tablet by  mouth daily.   Yes [provider]  ferrous sulfate 325 (65 FE) MG tablet Take 325 mg by mouth daily with breakfast.   Yes [provider]  folic acid-vitamin b complex-vitamin c-selenium-zinc (DIALYVITE) 3 MG TABS tablet Take 1 tablet by mouth daily.   Yes [provider]  pantoprazole (PROTONIX) 40 MG tablet Take 1 tablet (40 mg total) by mouth daily. Patient taking differently: Take 40 mg by mouth 2 (two) times daily.  06/22/16  Yes Mody, Ulice Bold, MD  potassium citrate (UROCIT-K) 10 MEQ (1080 MG) SR tablet Take 10 mEq by mouth 2 (two) times daily.   Yes [provider]  acetaminophen (TYLENOL) 500 MG tablet Take 500 mg by mouth 3 (three) times daily. 6 am, 2 pm, 10 pm    [provider]  aspirin EC 81 MG tablet Take 81 mg by mouth once. Tuesday    [provider]  loperamide (IMODIUM A-D) 2 MG tablet Take 2 mg by mouth every 6 (six) hours as needed for diarrhea or loose stools.    [provider]  Menthol-Zinc Oxide (CALMOSEPTINE) 0.44-20.6 % OINT Apply 1 application topically every 12 (twelve) hours as needed. To buttocks for rash    [provider]  Nutritional Supplements (NUTRITIONAL DRINK) LIQD Take 60 mLs by mouth 2 (two) times daily. MED PASS    [provider]  senna-docusate (SENOKOT-S) 8.6-50 MG tablet Take 1 tablet by mouth at bedtime as needed for mild constipation. 06/22/16   Bettey Costa, MD  simethicone (MYLICON) 80 MG chewable tablet Chew 80 mg by mouth every 6 (six) hours as needed for flatulence.    [provider]   Physical Exam: Vitals:  07/07/17 1010 07/07/17 1013 07/07/17 1030 07/07/17 1100  BP: 107/76  (!) 115/50 125/64  Pulse:   (!) 59 (!) 110  Resp: 19  14   Temp:  98.2 F (36.8 C)    TempSrc:  Rectal    SpO2:   95% 95%  Weight:         General:  No apparent distress, WDWN, [place]/AT  Eyes: PERRL, EOMI, no scleral icterus, conjunctiva clear  ENT: moist oropharynx without  exudate, TM's benign, dentition fair  Neck: supple, no lymphadenopathy. No bruits or thyromegaly  Cardiovascular: regular rate without MRG; 2+ peripheral pulses, no JVD, no peripheral edema  Respiratory: CTA biL, good air movement without wheezing, rhonchi or crackled. Respiratory effort normal  Abdomen: soft, non tender to palpation, positive bowel sounds, no guarding, no rebound  Skin: no rashes or lesions  Musculoskeletal: normal bulk and tone, no joint swelling  Psychiatric: normal mood and affect, oriented to person only  Neurologic: CN 2-12 grossly intact, Motor strength 5/5 in all 4 groups with symmetric DTR's and non-focal sensory exam  Labs on Admission:  Basic Metabolic Panel: Recent Labs  Lab 07/07/17 0945  NA 138  K 3.9  CL 101  CO2 26  GLUCOSE 123*  BUN 26*  CREATININE 1.20  CALCIUM 9.0   Liver Function Tests: Recent Labs  Lab 07/07/17 0945  AST 22  ALT 14*  ALKPHOS 76  BILITOT 0.6  PROT 6.3*  ALBUMIN 3.5   No results for input(s): LIPASE, AMYLASE in the last 168 hours. No results for input(s): AMMONIA in the last 168 hours. CBC: Recent Labs  Lab 07/07/17 0945  WBC 9.3  NEUTROABS 5.4  HGB 13.6  HCT 41.0  MCV 93.2  PLT 230   Cardiac Enzymes: No results for input(s): CKTOTAL, CKMB, CKMBINDEX, TROPONINI in the last 168 hours.  BNP (last 3 results) No results for input(s): BNP in the last 8760 hours.  ProBNP (last 3 results) No results for input(s): PROBNP in the last 8760 hours.  CBG: No results for input(s): GLUCAP in the last 168 hours.  Radiological Exams on Admission: Dg Hip Unilat With Pelvis 2-3 Views Left  Result Date: 07/07/2017 CLINICAL DATA:  Deformity. EXAM: DG HIP (WITH OR WITHOUT PELVIS) 2-3V LEFT COMPARISON:  None. FINDINGS: There is a nondisplaced, non comminuted and nonangulated intertrochanteric fracture of the left proximal femur. No other acute fracture. Old right proximal femur fracture has been reduced with a  compression screw and lateral fixation plate. Hip joints, SI joints and symphysis pubis are normally aligned. Bones are diffusely demineralized. There are numerous pelvis vascular clips consistent with a previous radical prostatectomy. IMPRESSION: Nondisplaced, non comminuted and nonangulated intertrochanteric proximal left femur fracture. Electronically Signed   By: Lajean Manes M.D.   On: 07/07/2017 11:05    EKG: Independently reviewed.  Assessment/Plan Principal Problem:   Closed left hip fracture (HCC) Active Problems:   Dyspnea   Hypotension   Senile dementia   Will admit to floor with IV morphine as needed. Begin empiric IV ABX and SVN's. Consult Ortho, PT, and CSW. Repeat labs in AM  Diet: soft Fluids: NS@75  DVT Prophylaxis: SQ Heparin  Code Status: FULL  Family Communication: none  Disposition Plan: SNF  Time spent: 50 min

## 2017-07-07 NOTE — ED Notes (Signed)
Called daughter, Vaughan Basta, no answer-left message. Pt placed on 2L [place]. Pillow in between legs for comfort.

## 2017-07-07 NOTE — ED Notes (Signed)
Report to Teresa, RN.

## 2017-07-07 NOTE — ED Notes (Signed)
Attempt to insert catheter X 1 by this RN unsuccessful. Clarise Cruz, RN at bedside to assist.

## 2017-07-07 NOTE — Anesthesia Procedure Notes (Addendum)
Spinal  Patient location during procedure: OR Start time: 07/07/2017 9:03 PM End time: 07/07/2017 9:13 PM Staffing Anesthesiologist: Emmie Niemann, MD Resident/CRNA: Rolla Plate, CRNA Performed: resident/CRNA  Preanesthetic Checklist Completed: patient identified, site marked, surgical consent, pre-op evaluation, timeout performed, IV checked, risks and benefits discussed and monitors and equipment checked Spinal Block Patient position: right lateral decubitus Prep: ChloraPrep and site prepped and draped Patient monitoring: heart rate, continuous pulse ox, blood pressure and cardiac monitor Approach: midline Location: L4-5 Injection technique: single-shot Needle Needle type: Introducer and Pencan  Needle gauge: 24 G Needle length: 9 cm Additional Notes Negative paresthesia. Negative blood return. Positive free-flowing CSF. Expiration date of kit checked and confirmed. Patient tolerated procedure well, without complications.

## 2017-07-07 NOTE — Procedures (Signed)
OPERATIVE NOTE  DATE OF SURGERY:  07/07/2017  PATIENT NAME:  Victor Parks   DOB: 1923/10/22  MRN: [id number]   PRE-OPERATIVE DIAGNOSIS: Intertrochanteric fracture of the left hip  POST-OPERATIVE DIAGNOSIS:  Same  PROCEDURE: Open treatment of intertrochanteric fracture left hip with internal fixation trochanteric femoral nail  SURGEON:  Stark Bray MD    ANESTHESIA: spinal   ESTIMATED BLOOD LOSS: 409 mL   Complications: None  DRAINS: None  IMPLANTS UTILIZED: Synthes trochanteric femoral nail (TFNA) 11 x 235, with a 735 mm helical blade and a 36 mm distal cross locking screw.  INDICATIONS FOR SURGERY: Victor Parks is a 83 y.o. year old male who has been seen for a left intertrochanteric hip fracture. After discussion of the risks and benefits of surgical intervention, the patient expressed understanding of the risks benefits and agree with plans for surgical stabilization.   PROCEDURE IN DETAIL: The patient was brought to the operating room where a spinal anesthetic was administered per anesthesia.  Following this the patient was transferred onto the [facility] table where he was carefully positioned, padded and secured.  The left lower extremity was positioned and the C arm was brought in to confirm satisfactory reduction in the AP and lateral views.  Following this the left hip was prepped with ChloraPrep and draped in a sterile fashion.  Surgical timeout procedure took place. Next a mid lateral longitudinal incision beginning at the tip of the greater trochanter was made and extended for approximately 6 cm. Incision was carried through the subcu and the fascia was split. Following this, fluoroscopic guidance was used to place a guidepin into the proximal femur and its position was checked in both AP and lateral views and found to be satisfactory.  The opening reamer was now used.  I chose an 11 mm x 235 mm trochanteric nail and it was inserted with fluoroscopic guidance.  Once it  was to the appropriate depth, a second incision was made for placement of the helical blade. A guide pin was placed in the femoral head and once it was seen to be in satisfactory position in both the AP and lateral views it was measured for depth.  A 329 mm helical blade was chosen.  The lateral cortex was opened with the first reamer I then reamed to a depth and this was followed by placement of the helical blade to the appropriate depth.  The set screw was  tightened.  Now a third  incision was made and the guide for the distal cross locking screw was inserted.  I drilled across, measured for depth and then inserted a 36 mm distal cross locking screw. The final construct was checked in the AP and lateral views and seem to be satisfactory.  Bleeding was controlled by electrocoagulation.  The incisions were thoroughly irrigated.  #1 Vicryl was used to close the fascia in the proximal 2 incisions and then 2-0 Vicryl was used in the subcu of all 3 incisions followed by staples to the skin.  Sterile dressings were applied.  The patient was transferred to his hospital bed and taken to the recovery room in satisfactory condition.

## 2017-07-07 NOTE — ED Notes (Signed)
Pt back from x-ray.

## 2017-07-08 ENCOUNTER — Encounter: Payer: Self-pay | Admitting: Family Medicine

## 2017-07-08 ENCOUNTER — Other Ambulatory Visit: Payer: Self-pay

## 2017-07-08 LAB — COMPREHENSIVE METABOLIC PANEL
ALBUMIN: 3.2 g/dL — AB (ref 3.5–5.0)
ALT: 15 U/L — ABNORMAL LOW (ref 17–63)
ANION GAP: 8 (ref 5–15)
AST: 27 U/L (ref 15–41)
Alkaline Phosphatase: 64 U/L (ref 38–126)
BILIRUBIN TOTAL: 0.8 mg/dL (ref 0.3–1.2)
BUN: 24 mg/dL — AB (ref 6–20)
CALCIUM: 8.2 mg/dL — AB (ref 8.9–10.3)
CO2: 25 mmol/L (ref 22–32)
CREATININE: 1.12 mg/dL (ref 0.61–1.24)
Chloride: 105 mmol/L (ref 101–111)
GFR calc Af Amer: >60 mL/min (ref >60)
GFR calc non Af Amer: 55 mL/min — ABNORMAL LOW (ref >60)
Glucose, Bld: 147 mg/dL — ABNORMAL HIGH (ref 65–99)
POTASSIUM: 4.4 mmol/L (ref 3.5–5.1)
SODIUM: 138 mmol/L (ref 135–145)
Total Protein: 5.7 g/dL — ABNORMAL LOW (ref 6.5–8.1)

## 2017-07-08 LAB — CBC
HEMATOCRIT: 32.5 % — AB (ref 40.0–52.0)
Hemoglobin: 11 g/dL — ABNORMAL LOW (ref 13.0–18.0)
MCH: 31.8 pg (ref 26.0–34.0)
MCHC: 33.9 g/dL (ref 32.0–36.0)
MCV: 93.8 fL (ref 80.0–100.0)
PLATELETS: 157 10*3/uL (ref 150–440)
RBC: 3.47 MIL/uL — ABNORMAL LOW (ref 4.40–5.90)
RDW: 13.8 % (ref 11.5–14.5)
WBC: 9.5 10*3/uL (ref 3.8–10.6)

## 2017-07-08 LAB — GLUCOSE, CAPILLARY: GLUCOSE-CAPILLARY: 132 mg/dL — AB (ref 65–99)

## 2017-07-08 MED ORDER — CEFAZOLIN SODIUM-DEXTROSE 1-4 GM/50ML-% IV SOLN: 1.0000 g | Freq: Four times a day (QID) | INTRAVENOUS | Status: DC

## 2017-07-08 MED ORDER — ENOXAPARIN SODIUM 40 MG/0.4ML ~~LOC~~ SOLN
40.0000 mg | SUBCUTANEOUS | Status: DC
  Administered 2017-07-09 (×2): 40 mg via SUBCUTANEOUS
  Filled 2017-07-08 (×2): qty 0.4

## 2017-07-08 MED ORDER — HYDRALAZINE HCL 20 MG/ML IJ SOLN: 10.0000 mg | INTRAMUSCULAR | Status: DC | PRN

## 2017-07-08 MED ORDER — CEFAZOLIN SODIUM-DEXTROSE 1-4 GM-%(50ML) IV SOLR
1.0000 g | Freq: Four times a day (QID) | INTRAVENOUS | Status: AC
  Administered 2017-07-08 (×2): 1 g via INTRAVENOUS
  Filled 2017-07-08: qty 50
  Filled 2017-07-08: qty 10
  Filled 2017-07-08 (×2): qty 50

## 2017-07-08 NOTE — Progress Notes (Signed)
Patient is s/p surgery with blood pressures noted above normal limits. Patient has been given PRN pain medication and environmental adjustments have been made to help decrease blood pressure. Call placed to prime MD. Awaiting call back. Safety maintained. Bed kept in lowest position. Call bell kept within reach. Will continue to monitor.

## 2017-07-08 NOTE — Evaluation (Signed)
Occupational Therapy Evaluation Patient Details Name: Victor Parks MRN: [id number] DOB: 01/25/1924 Today's Date: 07/08/2017    History of Present Illness presented to ER secondary to mechanical fall with acute onset of L hip pain; admitted with L intertrochanteric hip fracture, status post femoral nailing 12/9, WBAT.   Clinical Impression   Patient seen for OT evaluation this date.  Patient lives at [facility] and suffered a fall in the bathroom and is unable to recall the event.  He has dementia and is a poor historian but reports he was able to walk with a walker previously, could perform basic self care tasks but required assist with homemaking, medication management and meal prep.  He presents with muscle weakness, decreased transfers, decreased functional mobility and decreased ability to perform basic self care tasks.  He would benefit from skilled OT to maximize his safety and independence in daily tasks.  He will likely require short term rehab prior to returning to his previous level of care.      SNF    Equipment Recommendations       Recommendations for Other Services       Precautions / Restrictions Precautions Precautions: Fall Precaution Comments: R LQ urostomy Restrictions Weight Bearing Restrictions: Yes LLE Weight Bearing: Weight bearing as tolerated      Mobility Bed Mobility Overal bed mobility: Needs Assistance Bed Mobility: Supine to Sit;Sit to Supine     Supine to sit: Max assist Sit to supine: Max assist;+2 for physical assistance   General bed mobility comments: assist for LE management, truncal elevation  Transfers Overall transfer level: Needs assistance Equipment used: Rolling walker (2 wheeled) Transfers: Sit to/from Stand Sit to Stand: Max assist;+2 physical assistance         General transfer comment: assist for lift off, standing balance and weight shift; very heavy posterior trunk lean with absent spontaneous righting  reactions    Balance Overall balance assessment: Needs assistance Sitting-balance support: No upper extremity supported;Feet supported Sitting balance-Leahy Scale: Fair Sitting balance - Comments: posterior lean   Standing balance support: Bilateral upper extremity supported Standing balance-Leahy Scale: Zero Standing balance comment: very heavy posterior lean, absent righting reacitons                           ADL either performed or assessed with clinical judgement   ADL Overall ADL's : Needs assistance/impaired Eating/Feeding: Modified independent   Grooming: Bed level;Set up   Upper Body Bathing: Minimal assistance   Lower Body Bathing: Maximal assistance   Upper Body Dressing : Minimal assistance   Lower Body Dressing: Maximal assistance   Toilet Transfer: +2 for physical assistance           Functional mobility during ADLs: +2 for physical assistance General ADL Comments: Patient with increased pain this date and had difficulty with any lower extremity movements. Decreased toleration of transfers.      Vision         Perception     Praxis      Pertinent Vitals/Pain Pain Assessment: 0-10 Pain Score: 8  Faces Pain Scale: Hurts whole lot Pain Location: left hip with movement Pain Descriptors / Indicators: Aching;Guarding Pain Intervention(s): Limited activity within patient's tolerance;Monitored during session;Repositioned     Hand Dominance Right   Extremity/Trunk Assessment Upper Extremity Assessment Upper Extremity Assessment: Generalized weakness   Lower Extremity Assessment Lower Extremity Assessment: Generalized weakness       Communication Communication Communication: [facility]  Cognition Arousal/Alertness: Awake/alert Behavior During Therapy: WFL for tasks assessed/performed Overall Cognitive Status: No family/caregiver present to determine baseline cognitive functioning                                 General  Comments: Patient was oriented to self, unable to recall events of fall or situation.  He was oriented to the month but not year and was aware he is in the hospital.  He reports he lives with wife however no family to confirm this information.   General Comments       Exercises    Shoulder Instructions      Home Living Family/patient expects to be discharged to:: Skilled nursing facility                                 Additional Comments: Per chart, is resident of [facility]      Prior Functioning/Environment Level of Independence: Needs assistance  Gait / Transfers Assistance Needed: reports he used a walker in the past and could ambulate without additional assistance. ADL's / Homemaking Assistance Needed: assistance for meal preparation, homemaking, medications.  reports he was able to perform basic self care with modified independence however he is a poor historian   Comments: Patient has difficulty recalling information regarding his baseline level of care.         OT Problem List: Decreased strength;Decreased activity tolerance;Decreased cognition;Decreased knowledge of use of DME or AE;Decreased range of motion;Impaired balance (sitting and/or standing);Decreased safety awareness;Pain      OT Treatment/Interventions: Self-care/ADL training;DME and/or AE instruction;Therapeutic activities;Balance training;Therapeutic exercise;Cognitive remediation/compensation;Patient/family education    OT Goals(Current goals can be found in the care plan section) Acute Rehab OT Goals Patient Stated Goal: Patient reports he would like to get back to doing things he did before. OT Goal Formulation: With patient Time For Goal Achievement: 07/20/17 Potential to Achieve Goals: Good ADL Goals Pt Will Perform Lower Body Dressing: (P) with mod assist Pt Will Transfer to Toilet: (P) with mod assist  OT Frequency: Min 1X/week   Barriers to D/C:            Co-evaluation               AM-PAC PT "6 Clicks" Daily Activity     Outcome Measure Help from another person eating meals?: None Help from another person taking care of personal grooming?: A Little Help from another person toileting, which includes using toliet, bedpan, or urinal?: Total Help from another person bathing (including washing, rinsing, drying)?: Total Help from another person to put on and taking off regular upper body clothing?: A Lot Help from another person to put on and taking off regular lower body clothing?: Total 6 Click Score: 12   End of Session    Activity Tolerance: Patient limited by pain Patient left: in bed;with call bell/phone within reach;with bed alarm set  OT Visit Diagnosis: Muscle weakness (generalized) (M62.81);Unsteadiness on feet (R26.81);History of falling (Z91.81)                Time: 1025-1050 OT Time Calculation (min): 25 min Charges:  OT General Charges $OT Visit: 1 Visit OT Evaluation $OT Eval Low Complexity: 1 Low OT Treatments $Self Care/Home Management : 8-22 mins G-Codes: OT G-codes **NOT FOR INPATIENT CLASS** Functional Assessment Tool Used: AM-PAC 6 Clicks Daily Activity;Clinical judgement Functional Limitation:  Self care Self Care Current Status [phone]): At least 60 percent but less than 80 percent impaired, limited or restricted Self Care Goal Status (D5831): At least 20 percent but less than 40 percent impaired, limited or restricted   Rheannon Cerney T Jalia Zuniga, OTR/L, CLT   Ameliyah Sarno 07/08/2017, 11:51 AM

## 2017-07-08 NOTE — Clinical Social Work Placement (Signed)
   CLINICAL SOCIAL WORK PLACEMENT  NOTE  Date:  07/08/2017  Patient Details  Name: Victor Parks MRN: [id number] Date of Birth: 1923/10/04  Clinical Social Work is seeking post-discharge placement for this patient at the [place] level of care (*CSW will initial, date and re-position this form in  chart as items are completed):  Yes   Patient/family provided with [facility] Work Department's list of facilities offering this level of care within the geographic area requested by the patient (or if unable, by the patient's family).  Yes   Patient/family informed of their freedom to choose among providers that offer the needed level of care, that participate in Medicare, Medicaid or managed care program needed by the patient, have an available bed and are willing to accept the patient.  Yes   Patient/family informed of [facility]'s ownership interest in [facility] and [facility], as well as of the fact that they are under no obligation to receive care at these facilities.  PASRR submitted to EDS on 07/08/17     PASRR number received on 07/08/17     Existing PASRR number confirmed on       FL2 transmitted to all facilities in geographic area requested by pt/family on 07/08/17     FL2 transmitted to all facilities within larger geographic area on       Patient informed that his/her managed care company has contracts with or will negotiate with certain facilities, including the following:            Patient/family informed of bed offers received.  Patient chooses bed at       Physician recommends and patient chooses bed at      Patient to be transferred to   on  .  Patient to be transferred to facility by       Patient family notified on   of transfer.  Name of family member notified:        PHYSICIAN       Additional Comment:    _______________________________________________ Inita Uram, Veronia Beets, LCSW 07/08/2017, 5:08 PM

## 2017-07-08 NOTE — NC FL2 (Signed)
[place] LEVEL OF CARE SCREENING TOOL     IDENTIFICATION  Patient Name: Victor Parks Birthdate: [birth year] Sex: male Admission Date (Current Location): 07/07/2017  [place] and [place] Number:  Engineering geologist and Address:  [facility], [address] [place], [place] [address]      Provider Number: [id number]  Attending Physician Name and Address:  Loletha Grayer, MD  Relative Name and Phone Number:       Current Level of Care: Hospital Recommended Level of Care: [place] Prior Approval Number:    Date Approved/Denied:   PASRR Number: [id number])  Discharge Plan: SNF    Current Diagnoses: Patient Active Problem List   Diagnosis Date Noted  . Closed left hip fracture ([place]) 07/07/2017  . Hypotension 07/07/2017  . 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 Closed displaced intertrochanteric fracture of left femur ([place])   . Severe sepsis with septic shock ([place]) 06/17/2016  . Acute blood loss anemia 06/17/2016  . Septic shock ([place]) 06/16/2016  . Protein-calorie malnutrition, severe 06/15/2016  . Abdominal pain   . Dyspnea 06/13/2016  . Pressure injury of skin 06/13/2016    Orientation RESPIRATION BLADDER Height & Weight     Self, Time, Place  O2(2 Liters Oxygen. ) Incontinent, Urostomy Weight: 155 lb (70.3 kg) Height:     BEHAVIORAL SYMPTOMS/MOOD NEUROLOGICAL BOWEL NUTRITION STATUS      Continent Diet(Regular Diet. )  AMBULATORY STATUS COMMUNICATION OF NEEDS Skin   Extensive Assist Verbally Surgical wounds(Incision: Left Leg. )                       Personal Care Assistance Level of Assistance  Bathing, Feeding, Dressing Bathing Assistance: Limited assistance Feeding assistance: Independent Dressing Assistance: Limited assistance     Functional Limitations Info  Sight, Hearing, Speech Sight Info: Adequate Hearing Info: Adequate Speech Info: Adequate    SPECIAL CARE FACTORS FREQUENCY  PT (By  licensed PT), OT (By licensed OT)     PT Frequency: (5) OT Frequency: (5)            Contractures      Additional Factors Info  Code Status, Allergies Code Status Info: (DNR ) Allergies Info: (Ciprofloxacin)           Current Medications (07/08/2017):  This is the current hospital active medication list Current Facility-Administered Medications  Medication Dose Route Frequency Provider Last Rate Last Dose  . acetaminophen (TYLENOL) tablet 650 mg  650 mg Oral Q6H PRN Idelle Crouch, MD   650 mg at 07/08/17 1139   Or  . acetaminophen (TYLENOL) suppository 650 mg  650 mg Rectal Q6H PRN Idelle Crouch, MD      . bisacodyl (DULCOLAX) suppository 10 mg  10 mg Rectal Daily PRN Idelle Crouch, MD      . ceFAZolin (ANCEF) IVPB 1 g/50 mL  1 g Intravenous Q6H Stark Bray, MD   Stopped at 07/08/17 0530  . docusate sodium (COLACE) capsule 100 mg  100 mg Oral BID Idelle Crouch, MD   100 mg at 07/08/17 1139  . [START ON 07/09/2017] enoxaparin (LOVENOX) injection 40 mg  40 mg Subcutaneous Q24H Stark Bray, MD      . ferrous sulfate tablet 325 mg  325 mg Oral Q breakfast Idelle Crouch, MD   325 mg at 07/08/17 1139  . hydrALAZINE (APRESOLINE) injection 10 mg  10 mg Intravenous Q4H PRN Saundra Shelling, MD      .  HYDROcodone-acetaminophen (NORCO/VICODIN) 5-325 MG per tablet 1-2 tablet  1-2 tablet Oral Q6H PRN Stark Bray, MD   2 tablet at 07/08/17 0009  . ipratropium-albuterol (DUONEB) 0.5-2.5 (3) MG/3ML nebulizer solution 3 mL  3 mL Nebulization Q6H PRN Idelle Crouch, MD      . morphine 2 MG/ML injection 2 mg  2 mg Intravenous Q2H PRN Idelle Crouch, MD   2 mg at 07/08/17 0548  . ondansetron (ZOFRAN) tablet 4 mg  4 mg Oral Q6H PRN Stark Bray, MD       Or  . ondansetron [facility]) injection 4 mg  4 mg Intravenous Q6H PRN Stark Bray, MD   4 mg at 07/08/17 0548  . pantoprazole (PROTONIX) injection 40 mg  40 mg Intravenous Q12H Idelle Crouch, MD   40 mg  at 07/08/17 1138  . potassium citrate (UROCIT-K) SR tablet 10 mEq  10 mEq Oral BID Idelle Crouch, MD   10 mEq at 07/08/17 0010     Discharge Medications: Please see discharge summary for a list of discharge medications.  Relevant Imaging Results:  Relevant Lab Results:   Additional Information (SSN: [id number])  Sandon Yoho, Veronia Beets, LCSW

## 2017-07-08 NOTE — Progress Notes (Signed)
1 Day Post-Op   Subjective/Chief Complaint: The patient is resting and appears to be comfortable.  I believe he is having the expected degree of pain.   Objective: Vital signs in last 24 hours: Temp:  [97.1 F (36.2 C)-98.7 F (37.1 C)] 97.9 F (36.6 C) (12/10 0538) Pulse Rate:  [52-110] 85 (12/10 0538) Resp:  [14-24] 20 (12/10 0538) BP: (84-229)/(35-183) 112/92 (12/10 0538) SpO2:  [91 %-100 %] 98 % (12/10 0538) Weight:  [58.1 kg (128 lb)-70.3 kg (155 lb)] 70.3 kg (155 lb) (12/10 0500) Last BM Date: (pt unsure)  Intake/Output from previous day: 12/09 0701 - 12/10 0700 In: 2899 [P.O.:240; I.V.:2609; IV Piggyback:50] Out: 900 [Urine:775; Blood:125] Intake/Output this shift: Total I/O In: 1475 [I.V.:1425; IV Piggyback:50] Out: 900 [Urine:775; Blood:125]  Extremities: The patient seems to be more alert this morning.  Incisions are intact with minimal bloody drainage.  There is no calf tenderness.  Neurovascular is intact.  Lab Results:  Recent Labs    07/07/17 1342 07/08/17 0541  WBC 13.7* 9.5  HGB 12.6* 11.0*  HCT 37.3* 32.5*  PLT 187 157   BMET Recent Labs    07/07/17 0945 07/07/17 1342 07/08/17 0541  NA 138  --  138  K 3.9  --  4.4  CL 101  --  105  CO2 26  --  25  GLUCOSE 123*  --  147*  BUN 26*  --  24*  CREATININE 1.20 1.14 1.12  CALCIUM 9.0  --  8.2*   PT/INR Recent Labs    07/07/17 0945  LABPROT 13.3  INR 1.02   ABG No results for input(s): PHART, HCO3 in the last 72 hours.  Invalid input(s): PCO2, PO2  Studies/Results: Dg Chest 1 View  Result Date: 07/07/2017 CLINICAL DATA:  Patient found down today after a fall. Left hip fracture. EXAM: CHEST 1 VIEW COMPARISON:  Single-view of the chest 06/16/2016. FINDINGS: Minimal atelectasis is seen in the left lung base. Lungs are otherwise clear. Heart size is normal. No pneumothorax or pleural fluid. Aortic atherosclerosis is seen. No acute bony abnormality. IMPRESSION: No acute disease.  Atherosclerosis. Electronically Signed   By: Inge Rise M.D.   On: 07/07/2017 11:28   Dg Hip Operative Unilat W Or W/o Pelvis Left  Result Date: 07/07/2017 CLINICAL DATA:  LEFT hip fracture. EXAM: OPERATIVE LEFT HIP (WITH PELVIS IF PERFORMED) 4 VIEWS TECHNIQUE: Fluoroscopic spot image(s) were submitted for interpretation post-operatively. FLUOROSCOPY TIME:  69 seconds COMPARISON:  LEFT hip radiograph July 07, 2017 FINDINGS: LEFT femoral neck pinning and intramedullary rod transfixing nondisplaced intertrochanteric fracture. Surgical clips project in the pelvis. IMPRESSION: LEFT femur ORIF. Electronically Signed   By: Elon Alas M.D.   On: 07/07/2017 22:59   Dg Hip Unilat With Pelvis 2-3 Views Left  Result Date: 07/07/2017 CLINICAL DATA:  Deformity. EXAM: DG HIP (WITH OR WITHOUT PELVIS) 2-3V LEFT COMPARISON:  None. FINDINGS: There is a nondisplaced, non comminuted and nonangulated intertrochanteric fracture of the left proximal femur. No other acute fracture. Old right proximal femur fracture has been reduced with a compression screw and lateral fixation plate. Hip joints, SI joints and symphysis pubis are normally aligned. Bones are diffusely demineralized. There are numerous pelvis vascular clips consistent with a previous radical prostatectomy. IMPRESSION: Nondisplaced, non comminuted and nonangulated intertrochanteric proximal left femur fracture. Electronically Signed   By: Lajean Manes M.D.   On: 07/07/2017 11:05    Anti-infectives: Anti-infectives (From admission, onward)   Start     Dose/Rate  Route Frequency Ordered Stop   07/08/17 0315  ceFAZolin (ANCEF) IVPB 1 g/50 mL     1 g 100 mL/hr over 30 Minutes Intravenous Every 6 hours 07/08/17 0300 07/08/17 2059   07/08/17 0255  ceFAZolin (ANCEF) IVPB 1 g/50 mL premix  Status:  Discontinued     1 g 100 mL/hr over 30 Minutes Intravenous Every 6 hours 07/08/17 0255 07/08/17 0300   07/07/17 1600  cefTRIAXone (ROCEPHIN) 1 g in  dextrose 5 % 50 mL IVPB     1 g 120 mL/hr over 30 Minutes Intravenous Every 24 hours 07/07/17 1532     07/07/17 1115  cefTRIAXone (ROCEPHIN) 1 g in dextrose 5 % 50 mL IVPB - Premix  Status:  Discontinued     1 g 100 mL/hr over 30 Minutes Intravenous Every 24 hours 07/07/17 1114 07/07/17 1532      Assessment/Plan: s/p Procedure(s): INTRAMEDULLARY (IM) NAIL FEMORAL (Left) Advance diet.  The patient will start physical therapy.  Disposition planning to begin.  It is noted that the patient has been at an assisted living facility however a skilled care facility will be needed.  In the past he has gone to [facility].  The patient will now be followed by Dr. Marry Guan.  LOS: 1 day    Stark Bray 07/08/2017

## 2017-07-08 NOTE — Progress Notes (Signed)
Patient's blood pressure is now WNL. Will continue to monitor and endorse.

## 2017-07-08 NOTE — Evaluation (Signed)
Physical Therapy Evaluation Patient Details Name: Victor Parks MRN: [id number] DOB: [birth year] Today's Date: 07/08/2017   History of Present Illness  presented to ER secondary to mechanical fall with acute onset of L hip pain; admitted with L intertrochanteric hip fracture, status post femoral nailing 12/9, WBAT.  Clinical Impression  Upon evaluation, patient alert and oriented to self only; reoriented to location, situation with little/no carry-over.  L LE generally guarded in all planes (tolerating approx 25% normal range) due to pain, requiring act assist/hand-over-hand assist for all functional activities.  Currently requiring max assist +1-2 for bed mobility; max assist +2 for sit/stand and static standing balance. Very heavy posterior lean/weight shift, requiring hands-on assist at all times to prevent posterior LOB. Unsafe/unable to attempt further gait/OOB. Would benefit from skilled PT to address above deficits and promote optimal return to PLOF; recommend transition to STR upon discharge from acute hospitalization.     Follow Up Recommendations SNF    Equipment Recommendations  Rolling walker with 5" wheels    Recommendations for Other Services       Precautions / Restrictions Precautions Precautions: Fall Precaution Comments: R LQ urostomy Restrictions Weight Bearing Restrictions: Yes LLE Weight Bearing: Weight bearing as tolerated      Mobility  Bed Mobility Overal bed mobility: Needs Assistance Bed Mobility: Supine to Sit;Sit to Supine     Supine to sit: Max assist Sit to supine: Max assist;+2 for physical assistance   General bed mobility comments: assist for LE management, truncal elevation  Transfers Overall transfer level: Needs assistance Equipment used: Rolling walker (2 wheeled) Transfers: Sit to/from Stand Sit to Stand: Max assist;+2 physical assistance         General transfer comment: assist for lift off, standing balance and weight  shift; very heavy posterior trunk lean with absent spontaneous righting reactions  Ambulation/Gait             General Gait Details: unsafe/unable to tolerate due to pain, poor balance  Stairs            Wheelchair Mobility    Modified Rankin (Stroke Patients Only)       Balance Overall balance assessment: Needs assistance Sitting-balance support: No upper extremity supported;Feet supported Sitting balance-Leahy Scale: Fair Sitting balance - Comments: posterior lean   Standing balance support: Bilateral upper extremity supported Standing balance-Leahy Scale: Zero Standing balance comment: very heavy posterior lean, absent righting reacitons                             Pertinent Vitals/Pain Pain Assessment: Faces Faces Pain Scale: Hurts whole lot Pain Location: L hip Pain Descriptors / Indicators: Aching;Grimacing;Guarding Pain Intervention(s): Limited activity within patient's tolerance;Monitored during session;Repositioned    Home Living Family/patient expects to be discharged to:: Skilled nursing facility                 Additional Comments: Per chart, is resident of [facility]    Prior Function Level of Independence: Needs assistance         Comments: Patient questionable historian; unable to provide accurate information.  Will verify with family/facility as available.     Hand Dominance        Extremity/Trunk Assessment   Upper Extremity Assessment Upper Extremity Assessment: Overall WFL for tasks assessed    Lower Extremity Assessment Lower Extremity Assessment: Generalized weakness(L hip generally guarded due to pain, grossly 2+ to 3-/5 throughout; R LE grossly [facility])  Communication   Communication: HOH  Cognition Arousal/Alertness: Awake/alert Behavior During Therapy: WFL for tasks assessed/performed Overall Cognitive Status: No family/caregiver present to determine baseline cognitive functioning                                  General Comments: oriented to self only; follows commands, but requires frequent reorientation to task/situation      General Comments      Exercises Other Exercises Other Exercises: Supine L LE therex, 1x10, act assist ROM: ankle pumps, quad sets, heel slides, hip abduct/adduct.  Hand-over-hand assist required for movement in all planes.  Very guarded due to pain, tolerating approx 25% normal range.   Assessment/Plan    PT Assessment Patient needs continued PT services  PT Problem List Decreased strength;Decreased range of motion;Decreased cognition;Decreased balance;Decreased activity tolerance;Decreased mobility;Decreased coordination;Decreased knowledge of use of DME;Decreased safety awareness;Decreased knowledge of precautions;Decreased skin integrity;Pain       PT Treatment Interventions DME instruction;Gait training;Functional mobility training;Therapeutic activities;Therapeutic exercise;Balance training;Cognitive remediation;Patient/family education    PT Goals (Current goals can be found in the Care Plan section)  Acute Rehab PT Goals PT Goal Formulation: Patient unable to participate in goal setting Time For Goal Achievement: 07/22/17 Potential to Achieve Goals: Fair    Frequency BID   Barriers to discharge        Co-evaluation               AM-PAC PT "6 Clicks" Daily Activity  Outcome Measure Difficulty turning over in bed (including adjusting bedclothes, sheets and blankets)?: Unable Difficulty moving from lying on back to sitting on the side of the bed? : Unable Difficulty sitting down on and standing up from a chair with arms (e.g., wheelchair, bedside commode, etc,.)?: Unable Help needed moving to and from a bed to chair (including a wheelchair)?: Total Help needed walking in hospital room?: Total Help needed climbing 3-5 steps with a railing? : Total 6 Click Score: 6    End of Session Equipment Utilized During  Treatment: Gait belt Activity Tolerance: Patient tolerated treatment well Patient left: in bed;with call bell/phone within reach;with bed alarm set Nurse Communication: Mobility status PT Visit Diagnosis: Unsteadiness on feet (R26.81);Muscle weakness (generalized) (M62.81);Difficulty in walking, not elsewhere classified (R26.2);History of falling (Z91.81);Pain Pain - Right/Left: Left Pain - part of body: Hip    Time: 7096-2836 PT Time Calculation (min) (ACUTE ONLY): 23 min   Charges:   PT Evaluation $PT Eval Moderate Complexity: 1 Mod PT Treatments $Therapeutic Exercise: 8-22 mins   PT G Codes:   PT G-Codes **NOT FOR INPATIENT CLASS** Functional Assessment Tool Used: AM-PAC 6 Clicks Basic Mobility Functional Limitation: Mobility: Walking and moving around Mobility: Walking and Moving Around Current Status (O2947): At least 80 percent but less than 100 percent impaired, limited or restricted Mobility: Walking and Moving Around Goal Status [phone]): At least 20 percent but less than 40 percent impaired, limited or restricted   Jesly Hartmann H. Owens Shark, PT, DPT, NCS 07/08/17, 11:02 AM [phone]

## 2017-07-08 NOTE — Progress Notes (Signed)
Physical Therapy Treatment Patient Details Name: Victor Parks MRN: [id number] DOB: 02/22/1924 Today's Date: 07/08/2017    History of Present Illness presented to ER secondary to mechanical fall with acute onset of L hip pain; admitted with L intertrochanteric hip fracture, status post femoral nailing 12/9, WBAT.    PT Comments    Pt participates in PT with assistance required throughout. Pt with little awareness to situation. Yells in pain with movement of Left lower extremity with increased time to perform exercises with assist in small ranges. Pt is total assist for repositioning in bed. Pt has significant decreased short term memory making education difficult. Continue PT to progress range, strength and endurance to improve ability to assist with functional mobility.    Follow Up Recommendations        Equipment Recommendations       Recommendations for Other Services       Precautions / Restrictions Precautions Precautions: Fall Precaution Comments: R LQ urostomy Restrictions Weight Bearing Restrictions: Yes LLE Weight Bearing: Weight bearing as tolerated    Mobility  Bed Mobility               General bed mobility comments: Not tested due to yelling in pain with movement. Total assist for re positioning upward in bed to eat.   Transfers                    Ambulation/Gait                 Stairs            Wheelchair Mobility    Modified Rankin (Stroke Patients Only)       Balance                                            Cognition Arousal/Alertness: Awake/alert Behavior During Therapy: WFL for tasks assessed/performed Overall Cognitive Status: No family/caregiver present to determine baseline cognitive functioning                                        Exercises General Exercises - Lower Extremity Ankle Circles/Pumps: AAROM;Both;20 reps;Supine Quad Sets: Other (comment)(attempted;  unable to comprehend with/without A) Gluteal Sets: Other (comment)(attempted; unable to comprehend) Short Arc Quad: AAROM;Both;10 reps;Supine Heel Slides: AAROM;Both;10 reps;Supine(small range) Hip ABduction/ADduction: AAROM;Both;10 reps;Supine(small range) Straight Leg Raises: AAROM;Both;10 reps;Supine    General Comments        Pertinent Vitals/Pain Pain Assessment: Faces(denies pain verbally without movement) Faces Pain Scale: Hurts whole lot Pain Location: left hip with movement Pain Descriptors / Indicators: (Yells in pain with movement) Pain Intervention(s): Limited activity within patient's tolerance    Home Living                      Prior Function            PT Goals (current goals can now be found in the care plan section) Progress towards PT goals: Not progressing toward goals - comment    Frequency    BID      PT Plan Current plan remains appropriate    Co-evaluation              AM-PAC PT "6 Clicks" Daily Activity  Outcome Measure  Difficulty turning over  in bed (including adjusting bedclothes, sheets and blankets)?: Unable Difficulty moving from lying on back to sitting on the side of the bed? : Unable Difficulty sitting down on and standing up from a chair with arms (e.g., wheelchair, bedside commode, etc,.)?: Unable Help needed moving to and from a bed to chair (including a wheelchair)?: Total Help needed walking in hospital room?: Total Help needed climbing 3-5 steps with a railing? : Total 6 Click Score: 6    End of Session   Activity Tolerance: Patient limited by pain;Other (comment)(cognition) Patient left: in bed;with call bell/phone within reach;with bed alarm set   PT Visit Diagnosis: Unsteadiness on feet (R26.81);Muscle weakness (generalized) (M62.81);Difficulty in walking, not elsewhere classified (R26.2);History of falling (Z91.81);Pain Pain - Right/Left: Left Pain - part of body: Hip     Time: 1543-1610 PT Time  Calculation (min) (ACUTE ONLY): 27 min  Charges:  $Therapeutic Exercise: 23-37 mins                    G CodesLarae Grooms, PTA 07/08/2017, 4:24 PM

## 2017-07-08 NOTE — Progress Notes (Signed)
Patient ID: Victor Parks, male   DOB: August 17, 1923, 83 y.o.   MRN: [id number]  [facility] PROGRESS NOTE  Victor Parks QQP:[id number] DOB: 11/27/1923 DOA: 07/07/2017 PCP: [name], MD  HPI/Subjective: Patient feeling okay.  Having some pain in the hip.  Answer my questions appropriately.  Objective: Vitals:   07/08/17 0538 07/08/17 0739  BP: (!) 112/92 (!) 100/54  Pulse: 85 79  Resp: 20   Temp: 97.9 F (36.6 C) 98.4 F (36.9 C)  SpO2: 98% 97%    Filed Weights   07/07/17 0949 07/08/17 0500  Weight: 58.1 kg (128 lb) 70.3 kg (155 lb)    ROS: Review of Systems  Constitutional: Negative for chills and fever.  Eyes: Negative for blurred vision.  Respiratory: Negative for cough and shortness of breath.   Cardiovascular: Negative for chest pain.  Gastrointestinal: Negative for abdominal pain, constipation, diarrhea, nausea and vomiting.  Genitourinary: Negative for dysuria.  Musculoskeletal: Positive for joint pain.  Neurological: Negative for dizziness and headaches.   Exam: Physical Exam  HENT:  Nose: No mucosal edema.  Mouth/Throat: No oropharyngeal exudate or posterior oropharyngeal edema.  Eyes: Conjunctivae, EOM and lids are normal. Pupils are equal, round, and reactive to light.  Neck: No JVD present. Carotid bruit is not present. No edema present. No thyroid mass and no thyromegaly present.  Cardiovascular: S1 normal and S2 normal. Exam reveals no gallop.  No murmur heard. Pulses:      Dorsalis pedis pulses are 2+ on the right side, and 2+ on the left side.  Respiratory: No respiratory distress. He has no wheezes. He has no rhonchi. He has no rales.  GI: Soft. Bowel sounds are normal. There is no tenderness.  Musculoskeletal:       Right ankle: He exhibits swelling.       Left ankle: He exhibits swelling.  Lymphadenopathy:    He has no cervical adenopathy.  Neurological: He is alert.  Patient unable to straight leg raise with left leg  Skin:  Skin is warm. No rash noted. Nails show no clubbing.  Psychiatric: He has a normal mood and affect.      Data Reviewed: Basic Metabolic Panel: Recent Labs  Lab 07/07/17 0945 07/07/17 1342 07/08/17 0541  NA 138  --  138  K 3.9  --  4.4  CL 101  --  105  CO2 26  --  25  GLUCOSE 123*  --  147*  BUN 26*  --  24*  CREATININE 1.20 1.14 1.12  CALCIUM 9.0  --  8.2*   Liver Function Tests: Recent Labs  Lab 07/07/17 0945 07/08/17 0541  AST 22 27  ALT 14* 15*  ALKPHOS 76 64  BILITOT 0.6 0.8  PROT 6.3* 5.7*  ALBUMIN 3.5 3.2*   CBC: Recent Labs  Lab 07/07/17 0945 07/07/17 1342 07/08/17 0541  WBC 9.3 13.7* 9.5  NEUTROABS 5.4  --   --   HGB 13.6 12.6* 11.0*  HCT 41.0 37.3* 32.5*  MCV 93.2 91.6 93.8  PLT 230 187 157    CBG: Recent Labs  Lab 07/08/17 0811  GLUCAP 132*     Studies: Dg Chest 1 View  Result Date: 07/07/2017 CLINICAL DATA:  Patient found down today after a fall. Left hip fracture. EXAM: CHEST 1 VIEW COMPARISON:  Single-view of the chest 06/16/2016. FINDINGS: Minimal atelectasis is seen in the left lung base. Lungs are otherwise clear. Heart size is normal. No pneumothorax or pleural fluid. Aortic atherosclerosis is seen. No  acute bony abnormality. IMPRESSION: No acute disease. Atherosclerosis. Electronically Signed   By: Inge Rise M.D.   On: 07/07/2017 11:28   Dg Hip Operative Unilat W Or W/o Pelvis Left  Result Date: 07/07/2017 CLINICAL DATA:  LEFT hip fracture. EXAM: OPERATIVE LEFT HIP (WITH PELVIS IF PERFORMED) 4 VIEWS TECHNIQUE: Fluoroscopic spot image(s) were submitted for interpretation post-operatively. FLUOROSCOPY TIME:  69 seconds COMPARISON:  LEFT hip radiograph July 07, 2017 FINDINGS: LEFT femoral neck pinning and intramedullary rod transfixing nondisplaced intertrochanteric fracture. Surgical clips project in the pelvis. IMPRESSION: LEFT femur ORIF. Electronically Signed   By: Elon Alas M.D.   On: 07/07/2017 22:59   Dg Hip  Unilat With Pelvis 2-3 Views Left  Result Date: 07/07/2017 CLINICAL DATA:  Deformity. EXAM: DG HIP (WITH OR WITHOUT PELVIS) 2-3V LEFT COMPARISON:  None. FINDINGS: There is a nondisplaced, non comminuted and nonangulated intertrochanteric fracture of the left proximal femur. No other acute fracture. Old right proximal femur fracture has been reduced with a compression screw and lateral fixation plate. Hip joints, SI joints and symphysis pubis are normally aligned. Bones are diffusely demineralized. There are numerous pelvis vascular clips consistent with a previous radical prostatectomy. IMPRESSION: Nondisplaced, non comminuted and nonangulated intertrochanteric proximal left femur fracture. Electronically Signed   By: Lajean Manes M.D.   On: 07/07/2017 11:05    Scheduled Meds: . docusate sodium  100 mg Oral BID  . [START ON 07/09/2017] enoxaparin (LOVENOX) injection  40 mg Subcutaneous Q24H  . ferrous sulfate  325 mg Oral Q breakfast  . pantoprazole (PROTONIX) IV  40 mg Intravenous Q12H  . potassium citrate  10 mEq Oral BID   Continuous Infusions: . ceFAZolin Stopped (07/08/17 0530)  . cefTRIAXone (ROCEPHIN) 1 g IVPB Stopped (07/08/17 1327)    Assessment/Plan:  1. Left femoral neck fracture status post repair.  Physical therapy consultation.  PRN pain medications.  Advance diet and stopped IV fluids. 2. GERD on Protonix 3. History of bladder cancer and frequent UTIs.  Unfortunately no urine culture sent off from the ED.  Stop antibiotics. 4. History of hypotension 5. History of dementia  Code Status:     Code Status Orders  (From admission, onward)        Start     Ordered   07/07/17 1336  Do not attempt resuscitation (DNR)  Continuous    Question Answer Comment  In the event of cardiac or respiratory ARREST Do not call a "code blue"   In the event of cardiac or respiratory ARREST Do not perform Intubation, CPR, defibrillation or ACLS   In the event of cardiac or respiratory  ARREST Use medication by any route, position, wound care, and other measures to relive pain and suffering. May use oxygen, suction and manual treatment of airway obstruction as needed for comfort.      07/07/17 1335    Code Status History    Date Active Date Inactive Code Status Order ID Comments User Context   07/07/2017 13:29 07/07/2017 13:35 Full Code [id number]  Idelle Crouch, MD Inpatient   06/13/2016 05:31 06/22/2016 15:14 DNR [id number]  Saundra Shelling, MD Inpatient   05/29/2016 08:18 05/29/2016 17:01 DNR [id number]  Merlyn Lot, MD ED    Advance Directive Documentation     Most Recent Value  Type of Advance Directive  Healthcare Power of [place], Living will  Pre-existing out of facility DNR order (yellow form or pink MOST form)  No data  "MOST" Form in Place?  No data  Family Communication: First family member in the chart number is did not go through. Disposition Plan: We will need rehab  Consultants:  Orthopedic surgery  Time spent: 28 minutes  [place]

## 2017-07-08 NOTE — Clinical Social Work Note (Signed)
Clinical Social Work Assessment  Patient Details  Name: Victor Parks MRN: [id number] Date of Birth: May 07, 1924  Date of referral:  07/08/17               Reason for consult:  Facility Placement                Permission sought to share information with:  Chartered certified accountant granted to share information::  Yes, Verbal Permission Granted  Name::      [place]::   [place]   Relationship::     Contact Information:     Housing/Transportation Living arrangements for the past 2 months:  [place] of Information:  Patient Patient Interpreter Needed:  None Criminal Activity/Legal Involvement Pertinent to Current Situation/Hospitalization:  No - Comment as needed Significant Relationships:  Adult Children Lives with:  Facility Resident Do you feel safe going back to the place where you live?  Yes Need for family participation in patient care:  Yes (Comment)  Care giving concerns:  Patient is a resident at [name] along with his wife.    Social Worker assessment / plan:  Holiday representative (CSW) received SNF consult. PT is recommending SNF. CSW met with patient alone at bedside to discuss D/C plan. Patient was alert and oriented X3 and was laying in the bed. CSW introduced self and explained role of CSW department. Patient reported that he lives at [name] with his wife. CSW explained SNF process and that medicare requires a 3 night qualifying inpatient stay in a hospital in order to pay for SNF. Patient was admitted to inpatient on 07/07/17. Patient is agreeable to SNF search in [name]. FL2 complete and faxed out. CSW left patient's daughter Vaughan Basta a Advertising account executive. CSW will continue to follow and assist as needed.   Employment status:  Disabled (Comment on whether or not currently receiving Disability), Retired Forensic scientist:  Medicare PT Recommendations:  [place] / Referral to community resources:  [place]  Patient/Family's Response to care:  Patient is agreeable to [facility] in [name].   Patient/Family's Understanding of and Emotional Response to Diagnosis, Current Treatment, and Prognosis:  Patient was very pleasant and thanked CSW for assistance.   Emotional Assessment Appearance:  Appears stated age Attitude/Demeanor/Rapport:    Affect (typically observed):  Accepting, Adaptable, Pleasant Orientation:  Oriented to Self, Oriented to Place, Oriented to  Time, Fluctuating Orientation (Suspected and/or reported Sundowners) Alcohol / Substance use:  Not Applicable Psych involvement (Current and /or in the community):  No (Comment)  Discharge Needs  Concerns to be addressed:  Discharge Planning Concerns Readmission within the last 30 days:  No Current discharge risk:  Dependent with Mobility Barriers to Discharge:  Continued Medical Work up   [employer], Veronia Beets, LCSW 07/08/2017, 5:10 PM

## 2017-07-08 NOTE — Anesthesia Postprocedure Evaluation (Signed)
Anesthesia Post Note  Patient: Victor Parks  Procedure(s) Performed: INTRAMEDULLARY (IM) NAIL FEMORAL (Left Hip)  Patient location during evaluation: Nursing Unit Anesthesia Type: Spinal Level of consciousness: awake and alert and oriented Pain management: satisfactory to patient Vital Signs Assessment: post-procedure vital signs reviewed and stable Respiratory status: respiratory function stable Cardiovascular status: stable Postop Assessment: no backache, no headache, spinal receding, patient able to bend at knees, no apparent nausea or vomiting and adequate PO intake Anesthetic complications: no     Last Vitals:  Vitals:   07/08/17 0231 07/08/17 0538  BP: (!) 108/54 (!) 112/92  Pulse:  85  Resp:  20  Temp:  36.6 C  SpO2:  98%    Last Pain:  Vitals:   07/08/17 0618  TempSrc:   PainSc: 0-No pain                 Blima Singer

## 2017-07-09 LAB — GLUCOSE, CAPILLARY
GLUCOSE-CAPILLARY: 124 mg/dL — AB (ref 65–99)
GLUCOSE-CAPILLARY: 125 mg/dL — AB (ref 65–99)
GLUCOSE-CAPILLARY: 125 mg/dL — AB (ref 65–99)

## 2017-07-09 MED ORDER — CALCIUM CARBONATE-VITAMIN D 500-200 MG-UNIT PO TABS
1.0000 | ORAL_TABLET | Freq: Two times a day (BID) | ORAL | Status: DC
  Administered 2017-07-09 – 2017-07-10 (×2): 1 via ORAL
  Filled 2017-07-09 (×2): qty 1

## 2017-07-09 MED ORDER — LACTULOSE 10 GM/15ML PO SOLN
30.0000 g | Freq: Every day | ORAL | Status: DC | PRN
  Administered 2017-07-09: 30 g via ORAL
  Filled 2017-07-09: qty 60

## 2017-07-09 NOTE — Progress Notes (Signed)
Clinical Education officer, museum (CSW) contacted patient's daughter Vaughan Basta and presented bed offers. Per daughter she is patient's HPOA and chose [facility]. Per daughter patient has been to [facility] in the past. Plan is for patient to D/C to [facility] tomorrow pending medical clearance. Vaughan Basta is aware of above. [place] admissions coordinator at [facility] is aware of above. CSW will continue to follow and assist as needed.   [employer], LCSW [phone]

## 2017-07-09 NOTE — Progress Notes (Signed)
   Subjective: 2 Days Post-Op Procedure(s) (LRB): INTRAMEDULLARY (IM) NAIL FEMORAL (Left) Patient reports pain as mild.   Patient is well, and has had no acute complaints or problems Patient progressing with physical therapy extremely slow. Plan is to go Skilled nursing facility after hospital stay. no nausea and no vomiting Patient denies any chest pains or shortness of breath. Objective: Vital signs in last 24 hours: Temp:  [98.6 F (37 C)-99.2 F (37.3 C)] 98.6 F (37 C) (12/11 0738) Pulse Rate:  [30-97] 97 (12/11 0738) Resp:  [18-20] 20 (12/11 0409) BP: (87-119)/(51-68) 119/51 (12/11 0738) SpO2:  [74 %-98 %] 90 % (12/11 0738) Weight:  [71.3 kg (157 lb 1.6 oz)] 71.3 kg (157 lb 1.6 oz) (12/11 0430) well approximated incision Heels are non tender and elevated off the bed using rolled towels Intake/Output from previous day: 12/10 0701 - 12/11 0700 In: 240 [P.O.:240] Out: 1100 [Urine:1100] Intake/Output this shift: Total I/O In: -  Out: 400 [Urine:400]  Recent Labs    07/07/17 0945 07/07/17 1342 07/08/17 0541  HGB 13.6 12.6* 11.0*   Recent Labs    07/07/17 1342 07/08/17 0541  WBC 13.7* 9.5  RBC 4.07* 3.47*  HCT 37.3* 32.5*  PLT 187 157   Recent Labs    07/07/17 0945 07/07/17 1342 07/08/17 0541  NA 138  --  138  K 3.9  --  4.4  CL 101  --  105  CO2 26  --  25  BUN 26*  --  24*  CREATININE 1.20 1.14 1.12  GLUCOSE 123*  --  147*  CALCIUM 9.0  --  8.2*   Recent Labs    07/07/17 0945  INR 1.02    EXAM General - Patient is Alert, Disorganized and Confused Extremity - Neurologically intact Neurovascular intact Sensation intact distally Intact pulses distally Dorsiflexion/Plantar flexion intact No cellulitis present Compartment soft  Was noted to have bruising to the left anterior hip region. Surgery was tender to palpation. Dressing - dressing C/D/I Motor Function - intact, moving foot and toes well on exam.    Past Medical History:  Diagnosis  Date  . Bladder cancer ([place])   . Decubitus ulcer   . Hypertension   . Muscle weakness   . UTI (urinary tract infection)     Assessment/Plan: 2 Days Post-Op Procedure(s) (LRB): INTRAMEDULLARY (IM) NAIL FEMORAL (Left) Principal Problem:   Closed left hip fracture [facility]) Active Problems:   Dyspnea   Hypotension   Senile dementia   Closed displaced intertrochanteric fracture of left femur (HCC)  Estimated body mass index is 23.89 kg/m as calculated from the following:   Height as of 02/10/17: 5\' 8"  (1.727 m).   Weight as of this encounter: 71.3 kg (157 lb 1.6 oz). Up with therapy Plan for discharge tomorrow Discharge to SNF  Labs: Reviewed. None today DVT Prophylaxis - Lovenox, Foot Pumps and TED hose Weight-Bearing as tolerated to left leg Begin working on bowel movement Continue Lovenox 40 mg daily for 14 days at discharge. Follow-up in [place] in 6 weeks. Sooner if any problems. Staples are to be removed 2 weeks postop and apply half-inch Steri-Strips with benzoin Change dressing as needed  Wille Glaser R. [place] 07/09/2017, 10:22 AM

## 2017-07-09 NOTE — Progress Notes (Signed)
No pressure injury to sacrum, entered in error. Sacrum dressing changed relating to soilage. No breakdown, redness, repositioned to right side

## 2017-07-09 NOTE — Progress Notes (Signed)
Physical Therapy Treatment Patient Details Name: Victor Parks MRN: [id number] DOB: 11/27/1923 Today's Date: 07/09/2017    History of Present Illness presented to ER secondary to mechanical fall with acute onset of L hip pain; admitted with L intertrochanteric hip fracture, status post femoral nailing 12/9, WBAT.    PT Comments    Pt is making gradual progress towards goals, more confused in PM session compared to AM session. Still needs +2 assist for all mobility, appears to have more pain this session as well. Will continue to progress.   Follow Up Recommendations  SNF     Equipment Recommendations  Rolling walker with 5" wheels    Recommendations for Other Services       Precautions / Restrictions Precautions Precautions: Fall Precaution Comments: R LQ urostomy Restrictions Weight Bearing Restrictions: Yes LLE Weight Bearing: Weight bearing as tolerated    Mobility  Bed Mobility Overal bed mobility: Needs Assistance Bed Mobility: Sit to Supine       Sit to supine: +2 for physical assistance;Max assist   General bed mobility comments: needs assist for readjusting back in bed.   Transfers Overall transfer level: Needs assistance Equipment used: Rolling walker (2 wheeled) Transfers: Sit to/from Stand Sit to Stand: Max assist;+2 physical assistance;From elevated surface         General transfer comment: needs assist for upright standing with RW. 2 attempts as pt demonstrates heavy post leaning.  Ambulation/Gait Ambulation/Gait assistance: Max assist;+2 physical assistance Ambulation Distance (Feet): 2 Feet Assistive device: Rolling walker (2 wheeled) Gait Pattern/deviations: Step-to pattern     General Gait Details: able to take steps with increased difficulty this session from chair to bed. Shuffles along with small steps noted.   Stairs            Wheelchair Mobility    Modified Rankin (Stroke Patients Only)       Balance                                            Cognition Arousal/Alertness: Awake/alert Behavior During Therapy: WFL for tasks assessed/performed Overall Cognitive Status: Impaired/Different from baseline                                        Exercises Other Exercises Other Exercises: supine ther-ex on L LE including 12 reps of ankle pumps, SAQ, SLRs, hip abd/add. Performed on R LE for demonstration prior to performance on L LE. Able to participate with needing mod assistance.    General Comments        Pertinent Vitals/Pain Pain Assessment: Faces Faces Pain Scale: Hurts little more Pain Location: left hip with movement Pain Descriptors / Indicators: Aching;Guarding Pain Intervention(s): Limited activity within patient's tolerance    Home Living                      Prior Function            PT Goals (current goals can now be found in the care plan section) Acute Rehab PT Goals Patient Stated Goal: Patient reports he would like to get back to doing things he did before. PT Goal Formulation: With patient Time For Goal Achievement: 07/22/17 Potential to Achieve Goals: Good Progress towards PT goals: Progressing toward goals  Frequency    BID      PT Plan Current plan remains appropriate    Co-evaluation              AM-PAC PT "6 Clicks" Daily Activity  Outcome Measure  Difficulty turning over in bed (including adjusting bedclothes, sheets and blankets)?: Unable Difficulty moving from lying on back to sitting on the side of the bed? : Unable Difficulty sitting down on and standing up from a chair with arms (e.g., wheelchair, bedside commode, etc,.)?: Unable Help needed moving to and from a bed to chair (including a wheelchair)?: A Lot Help needed walking in hospital room?: A Lot Help needed climbing 3-5 steps with a railing? : Total 6 Click Score: 8    End of Session Equipment Utilized During Treatment: Gait belt Activity  Tolerance: Patient limited by pain;Other (comment) Patient left: in bed;with bed alarm set;with SCD's reapplied Nurse Communication: Mobility status PT Visit Diagnosis: Unsteadiness on feet (R26.81);Muscle weakness (generalized) (M62.81);Difficulty in walking, not elsewhere classified (R26.2);History of falling (Z91.81);Pain Pain - Right/Left: Left Pain - part of body: Hip     Time: 9381-0175 PT Time Calculation (min) (ACUTE ONLY): 23 min  Charges:  $Gait Training: 8-22 mins $Therapeutic Exercise: 8-22 mins                    G Codes:  Functional Assessment Tool Used: AM-PAC 6 Clicks Basic Mobility Functional Limitation: Mobility: Walking and moving around Mobility: Walking and Moving Around Current Status (Z0258): At least 80 percent but less than 100 percent impaired, limited or restricted Mobility: Walking and Moving Around Goal Status [phone]): At least 60 percent but less than 80 percent impaired, limited or restricted    Victor Parks, PT, DPT [phone]    Victor Parks 07/09/2017, 4:41 PM

## 2017-07-09 NOTE — Progress Notes (Signed)
Physical Therapy Treatment Patient Details Name: Victor Parks MRN: [id number] DOB: 11/07/1923 Today's Date: 07/09/2017    History of Present Illness presented to ER secondary to mechanical fall with acute onset of L hip pain; admitted with L intertrochanteric hip fracture, status post femoral nailing 12/9, WBAT.    PT Comments    Pt is making gradual progress towards goals with ability to ambulate to recliner this date. Able to participate in there-ex with safe technique and less guarding. Needs +2 for all mobility at this time. Will continue to progress. Still remains confused, although able to follow commands.   Follow Up Recommendations  SNF     Equipment Recommendations  Rolling walker with 5" wheels    Recommendations for Other Services       Precautions / Restrictions Precautions Precautions: Fall Precaution Comments: R LQ urostomy Restrictions Weight Bearing Restrictions: Yes LLE Weight Bearing: Weight bearing as tolerated    Mobility  Bed Mobility Overal bed mobility: Needs Assistance Bed Mobility: Supine to Sit     Supine to sit: Max assist Sit to supine: +2 for physical assistance   General bed mobility comments: first attempt with max assist +1. Unable to be successful as he demonstrates heavy post leaning. On 2nd attempt, able to sit with max assist. Performed forward reaching to improve balance, able to sit at EOB with only min assist after a few minutes  Transfers Overall transfer level: Needs assistance Equipment used: Rolling walker (2 wheeled) Transfers: Sit to/from Stand Sit to Stand: Max assist;+2 physical assistance;From elevated surface         General transfer comment: needs assist for anterior translation of balance. Once standing, able to WB on B LEs. Unsteady and painful  Ambulation/Gait Ambulation/Gait assistance: Mod assist;+2 physical assistance Ambulation Distance (Feet): 2 Feet Assistive device: Rolling walker (2  wheeled) Gait Pattern/deviations: Step-to pattern     General Gait Details: able to take a few steps over to recliner with +2 assist. Pt fatigues quickly, however able to follow commands. Heavy cues for anterior leaning.   Stairs            Wheelchair Mobility    Modified Rankin (Stroke Patients Only)       Balance                                            Cognition Arousal/Alertness: Awake/alert Behavior During Therapy: WFL for tasks assessed/performed Overall Cognitive Status: No family/caregiver present to determine baseline cognitive functioning                                 General Comments: Pt was able to follow commands, but unsure of name or recall of events.      Exercises Other Exercises Other Exercises: supine ther-ex on L LE including 10 reps of ankle pumps, quad sets, SLRs, hip abd/add. Performed on R LE for demonstration prior to performance on L LE. Able to participate with needing mod assistance.    General Comments        Pertinent Vitals/Pain Pain Assessment: Faces Faces Pain Scale: Hurts little more Pain Location: left hip with movement Pain Descriptors / Indicators: Aching;Guarding Pain Intervention(s): Limited activity within patient's tolerance;Premedicated before session    Home Living  Prior Function            PT Goals (current goals can now be found in the care plan section) Acute Rehab PT Goals Patient Stated Goal: Patient reports he would like to get back to doing things he did before. PT Goal Formulation: With patient Time For Goal Achievement: 07/22/17 Potential to Achieve Goals: Good Progress towards PT goals: Progressing toward goals    Frequency    BID      PT Plan Current plan remains appropriate    Co-evaluation              AM-PAC PT "6 Clicks" Daily Activity  Outcome Measure  Difficulty turning over in bed (including adjusting  bedclothes, sheets and blankets)?: Unable Difficulty moving from lying on back to sitting on the side of the bed? : Unable Difficulty sitting down on and standing up from a chair with arms (e.g., wheelchair, bedside commode, etc,.)?: Unable Help needed moving to and from a bed to chair (including a wheelchair)?: A Lot Help needed walking in hospital room?: A Lot Help needed climbing 3-5 steps with a railing? : Total 6 Click Score: 8    End of Session Equipment Utilized During Treatment: Gait belt Activity Tolerance: Patient limited by pain;Other (comment) Patient left: in chair;with chair alarm set;with SCD's reapplied Nurse Communication: Mobility status PT Visit Diagnosis: Unsteadiness on feet (R26.81);Muscle weakness (generalized) (M62.81);Difficulty in walking, not elsewhere classified (R26.2);History of falling (Z91.81);Pain Pain - Right/Left: Left Pain - part of body: Hip     Time: 1020-1044 PT Time Calculation (min) (ACUTE ONLY): 24 min  Charges:  $Gait Training: 8-22 mins $Therapeutic Exercise: 8-22 mins                    G Codes:  Functional Assessment Tool Used: AM-PAC 6 Clicks Basic Mobility Functional Limitation: Mobility: Walking and moving around Mobility: Walking and Moving Around Current Status (X3818): At least 80 percent but less than 100 percent impaired, limited or restricted Mobility: Walking and Moving Around Goal Status [phone]): At least 60 percent but less than 80 percent impaired, limited or restricted    Victor Parks, PT, DPT [phone]    Victor Parks 07/09/2017, 11:09 AM

## 2017-07-09 NOTE — Progress Notes (Signed)
Patient ID: Victor Parks, male   DOB: [birth year], 83 y.o.   MRN: [id number]  Patient ID: Victor Parks, male   DOB: [birth year], 82 y.o.   MRN: [id number]  [facility] [employer] PROGRESS NOTE  Victor Parks JOA:[id number] DOB: 03-19-1924 DOA: 07/07/2017 PCP: Victor Eddy, MD  HPI/Subjective: Patient feeling okay.  Having some pain in the hip.  Answer my questions appropriately.  Objective: Vitals:   07/09/17 0409 07/09/17 0738  BP: (!) 114/59 (!) 119/51  Pulse: 96 97  Resp: 20   Temp: 99 F (37.2 C) 98.6 F (37 C)  SpO2: 92% 90%    Filed Weights   07/07/17 0949 07/08/17 0500 07/09/17 0430  Weight: 58.1 kg (128 lb) 70.3 kg (155 lb) 71.3 kg (157 lb 1.6 oz)    ROS: Review of Systems  Unable to perform ROS: Dementia  Respiratory: Negative for shortness of breath.   Cardiovascular: Negative for chest pain.  Gastrointestinal: Positive for constipation. Negative for abdominal pain.  Genitourinary: Negative for dysuria.  Musculoskeletal: Positive for joint pain.   Exam: Physical Exam  HENT:  Nose: No mucosal edema.  Mouth/Throat: No oropharyngeal exudate or posterior oropharyngeal edema.  Eyes: Conjunctivae, EOM and lids are normal. Pupils are equal, round, and reactive to light.  Neck: No JVD present. Carotid bruit is not present. No edema present. No thyroid mass and no thyromegaly present.  Cardiovascular: S1 normal and S2 normal. Exam reveals no gallop.  No murmur heard. Pulses:      Dorsalis pedis pulses are 2+ on the right side, and 2+ on the left side.  Respiratory: No respiratory distress. He has no wheezes. He has no rhonchi. He has no rales.  GI: Soft. Bowel sounds are normal. There is no tenderness.  Musculoskeletal:       Right ankle: He exhibits swelling.       Left ankle: He exhibits swelling.  Lymphadenopathy:    He has no cervical adenopathy.  Neurological: He is alert.  Patient unable to straight leg raise with left leg  Skin: Skin is warm.  Nails show no clubbing.  Slight redness on the buttock area in the sacrum.  No breakdown in skin.  Psychiatric: He has a normal mood and affect.      Data Reviewed: Basic Metabolic Panel: Recent Labs  Lab 07/07/17 0945 07/07/17 1342 07/08/17 0541  NA 138  --  138  K 3.9  --  4.4  CL 101  --  105  CO2 26  --  25  GLUCOSE 123*  --  147*  BUN 26*  --  24*  CREATININE 1.20 1.14 1.12  CALCIUM 9.0  --  8.2*   Liver Function Tests: Recent Labs  Lab 07/07/17 0945 07/08/17 0541  AST 22 27  ALT 14* 15*  ALKPHOS 76 64  BILITOT 0.6 0.8  PROT 6.3* 5.7*  ALBUMIN 3.5 3.2*   CBC: Recent Labs  Lab 07/07/17 0945 07/07/17 1342 07/08/17 0541  WBC 9.3 13.7* 9.5  NEUTROABS 5.4  --   --   HGB 13.6 12.6* 11.0*  HCT 41.0 37.3* 32.5*  MCV 93.2 91.6 93.8  PLT 230 187 157    CBG: Recent Labs  Lab 07/08/17 0811 07/09/17 0759 07/09/17 1240  GLUCAP 132* 124* 125*     Studies: Dg Hip Operative Unilat W Or W/o Pelvis Left  Result Date: 07/07/2017 CLINICAL DATA:  LEFT hip fracture. EXAM: OPERATIVE LEFT HIP (WITH PELVIS IF PERFORMED) 4 VIEWS TECHNIQUE: Fluoroscopic spot image(s) were submitted for  interpretation post-operatively. FLUOROSCOPY TIME:  69 seconds COMPARISON:  LEFT hip radiograph July 07, 2017 FINDINGS: LEFT femoral neck pinning and intramedullary rod transfixing nondisplaced intertrochanteric fracture. Surgical clips project in the pelvis. IMPRESSION: LEFT femur ORIF. Electronically Signed   By: Victor Parks M.D.   On: 07/07/2017 22:59    Scheduled Meds: . calcium-vitamin D  1 tablet Oral BID  . docusate sodium  100 mg Oral BID  . enoxaparin (LOVENOX) injection  40 mg Subcutaneous Q24H  . ferrous sulfate  325 mg Oral Q breakfast  . pantoprazole (PROTONIX) IV  40 mg Intravenous Q12H  . potassium citrate  10 mEq Oral BID    Assessment/Plan:  1. Left femoral neck fracture status post repair.  Physical therapy recommends rehab.  PRN pain medications.  Lovenox  for DVT prophylaxis.  Out to rehab tomorrow morning. 2. GERD on Protonix 3. History of bladder cancer and frequent UTIs.  Unfortunately no urine culture sent off from the ED.  Stop antibiotics. 4. History of hypotension 5. History of dementia 6. Slight redness in the sacral area.  Must rotate patient in the bed.  Code Status:     Code Status Orders  (From admission, onward)        Start     Ordered   07/07/17 1336  Do not attempt resuscitation (DNR)  Continuous    Question Answer Comment  In the event of cardiac or respiratory ARREST Do not call a "code blue"   In the event of cardiac or respiratory ARREST Do not perform Intubation, CPR, defibrillation or ACLS   In the event of cardiac or respiratory ARREST Use medication by any route, position, wound care, and other measures to relive pain and suffering. May use oxygen, suction and manual treatment of airway obstruction as needed for comfort.      07/07/17 1335    Code Status History    Date Active Date Inactive Code Status Order ID Comments User Context   07/07/2017 13:29 07/07/2017 13:35 Full Code [id number]  Victor Crouch, MD Inpatient   06/13/2016 05:31 06/22/2016 15:14 DNR [id number]  Victor Shelling, MD Inpatient   05/29/2016 08:18 05/29/2016 17:01 DNR [id number]  Victor Lot, MD ED    Advance Directive Documentation     Most Recent Value  Type of Advance Directive  Healthcare Power of [place], Living will  Pre-existing out of facility DNR order (yellow form or pink MOST form)  No data  "MOST" Form in Place?  No data     Family Communication: Spoke with daughter Victor Parks on the phone while she was at work [phone] Disposition Plan: We will need rehab  Consultants:  Orthopedic surgery  Time spent: 25 minutes  Victor Parks

## 2017-07-10 LAB — GLUCOSE, CAPILLARY: GLUCOSE-CAPILLARY: 125 mg/dL — AB (ref 65–99)

## 2017-07-10 MED ORDER — DOCUSATE SODIUM 100 MG PO CAPS: 100.0000 mg | ORAL_CAPSULE | Freq: Two times a day (BID) | ORAL | 0 refills | Status: DC

## 2017-07-10 MED ORDER — ENOXAPARIN SODIUM 40 MG/0.4ML ~~LOC~~ SOLN: 40.0000 mg | SUBCUTANEOUS | 0 refills | Status: DC

## 2017-07-10 MED ORDER — NITROFURANTOIN MONOHYD MACRO 100 MG PO CAPS: 100.0000 mg | ORAL_CAPSULE | Freq: Every day | ORAL | Status: DC

## 2017-07-10 MED ORDER — PANTOPRAZOLE SODIUM 40 MG PO TBEC: 40.0000 mg | DELAYED_RELEASE_TABLET | Freq: Two times a day (BID) | ORAL | 0 refills | Status: AC

## 2017-07-10 MED ORDER — HYDROCODONE-ACETAMINOPHEN 5-325 MG PO TABS: 1.0000 | ORAL_TABLET | Freq: Four times a day (QID) | ORAL | 0 refills | Status: DC | PRN

## 2017-07-10 MED ORDER — LACTULOSE 10 GM/15ML PO SOLN: 30.0000 g | Freq: Every day | ORAL | 0 refills | Status: DC | PRN

## 2017-07-10 NOTE — Clinical Social Work Placement (Signed)
   CLINICAL SOCIAL WORK PLACEMENT  NOTE  Date:  07/10/2017  Patient Details  Name: Victor Parks MRN: [id number] Date of Birth: 1924/03/19  Clinical Social Work is seeking post-discharge placement for this patient at the [place] level of care (*CSW will initial, date and re-position this form in  chart as items are completed):  Yes   Patient/family provided with [place] Work Department's list of facilities offering this level of care within the geographic area requested by the patient (or if unable, by the patient's family).  Yes   Patient/family informed of their freedom to choose among providers that offer the needed level of care, that participate in Medicare, Medicaid or managed care program needed by the patient, have an available bed and are willing to accept the patient.  Yes   Patient/family informed of [place]'s ownership interest in [facility] and [facility], as well as of the fact that they are under no obligation to receive care at these facilities.  PASRR submitted to EDS on 07/08/17     PASRR number received on 07/08/17     Existing PASRR number confirmed on       FL2 transmitted to all facilities in geographic area requested by pt/family on 07/08/17     FL2 transmitted to all facilities within larger geographic area on       Patient informed that his/her managed care company has contracts with or will negotiate with certain facilities, including the following:        Yes   Patient/family informed of bed offers received.  Patient chooses bed at [facility] )     Physician recommends and patient chooses bed at      Patient to be transferred to [facility] ) on 07/10/17.  Patient to be transferred to facility by [facility] )     Patient family notified on 07/10/17 of transfer.  Name of family member notified:  (Patient's daughter Vaughan Basta is aware of D/C today. )     PHYSICIAN        Additional Comment:    _______________________________________________ River Mckercher, Veronia Beets, LCSW 07/10/2017, 9:09 AM

## 2017-07-10 NOTE — Progress Notes (Signed)
Rn attempted to call report to [facility] place 2X. VSS. Pt in no distress. EMS is transferring the pt now.

## 2017-07-10 NOTE — Care Management Important Message (Signed)
Important Message  Patient Details  Name: Victor Parks MRN: [id number] Date of Birth: 21-Apr-1924   Medicare Important Message Given:  Yes    Jolly Mango, RN 07/10/2017, 8:50 AM

## 2017-07-10 NOTE — Discharge Instructions (Signed)
Open Reduction and Internal Fixation for Hip Fracture, Care After Refer to this sheet in the next few weeks. These instructions provide you with information about caring for yourself after your procedure. Your health care provider may also give you more specific instructions. Your treatment has been planned according to current medical practices, but problems sometimes occur. Call your health care provider if you have any problems or questions after your procedure. What can I expect after the procedure? After your procedure, it is typical to have the following:  Pain.  Swelling.  Difficulty walking.  Follow these instructions at home:  Have someone help you with everyday activities, such as showering and meals, for the first week after you leave the hospital or as directed.  Take medicines only as directed by your health care provider. Do not take any over-the-counter medicines without approval from your health care provider. Medicines such as aspirin and ibuprofen can increase your risk of bleeding.  Constipation is common after surgery from the pain medicines used and the decrease in your activity level. It is important to drink fluids and to increase your intake of fruits and vegetables. Stool softeners may be prescribed by your health care provider.  There are many different ways to close and cover an incision, including stitches, skin glue, and adhesive strips. Follow your health care provider's instructions about: ? Incision care. ? Bandage (dressing) changes and removal. ? Incision closure removal.  Wear compression stockings as directed by your health care provider. These stockings help to prevent blood clots and reduce swelling in your legs.  Do not take baths, swim, or use a hot tub until your health care provider approves.  Use crutches or a walker as directed by your health care provider.  Be sure to do any exercises that your physical therapist suggests. These exercises  will help to make your hip stronger and help you to recover more quickly.  Ask your health care provider when you can resume other activities, such as work, driving, and sex.  Do not drive or operate heavy machinery while taking pain medicine.  Keep all follow-up visits as directed by your health care provider. This is important. Contact a health care provider if:  You have fatigue.  You feel weak.  Your pain is not relieved by medicines.  You have a fever.  You have drainage, redness, swelling, or pain at your incision. Get help right away if:  Your incision is bleeding.  Your leg or foot is painful and swollen.  Your leg is pale or blue.  Your leg is cold.  Your leg tingles or is numb.  You have trouble breathing.  You have chest pain. This information is not intended to replace advice given to you by your health care provider. Make sure you discuss any questions you have with your health care provider. Document Released: 02/10/2014 Document Revised: 12/19/2015 Document Reviewed: 12/17/2013 Elsevier Interactive Patient Education  2017 [employer]   Hip Fracture A hip fracture is a fracture of the upper part of your thigh bone (femur). What are the causes? A hip fracture is caused by a direct blow to the side of your hip. This is usually the result of a fall but can occur in other circumstances, such as an automobile accident. What increases the risk? There is an increased risk of hip fractures in people with:  An unsteady walking pattern (gait) and those with conditions that contribute to poor balance, such as Parkinson's disease or dementia.  Osteopenia  and osteoporosis.  Cancer that spreads to the leg bones.  Certain metabolic diseases.  What are the signs or symptoms? Symptoms of hip fracture include:  Pain over the injured hip.  Inability to put weight on the leg in which the fracture occurred (although, some patients are able to walk after a hip  fracture).  Toes and foot of the affected leg point outward when you lie down.  How is this diagnosed? A physical exam can determine if a hip fracture is likely to have occurred. X-ray exams are needed to confirm the fracture and to look for other injuries. The X-ray exam can help to determine the type of hip fracture. Rarely, the fracture is not visible on an X-ray image and a CT scan or MRI will have to be done. How is this treated? The treatment for a fracture is usually surgery. This means using a screw, nail, or rod to hold the bones in place. Follow these instructions at home: Take all medicines as directed by your health care provider. Contact a health care provider if: Pain continues, even after taking pain medicine. This information is not intended to replace advice given to you by your health care provider. Make sure you discuss any questions you have with your health care provider. Document Released: 07/16/2005 Document Revised: 12/22/2015 Document Reviewed: 02/25/2013 Elsevier Interactive Patient Education  2017 [employer].

## 2017-07-10 NOTE — Progress Notes (Signed)
Patient is medically stable for D/C to [facility] today. Per Tracie admissions coordinator at [facility] patient can come today. RN will call report and arrange EMS for transport. Clinical Education officer, museum (CSW) sent D/C orders to [facility] via [place]. Patient is aware of above. CSW contacted patient's daughter Vaughan Basta and made her aware of above. Per Vaughan Basta she will complete admissions paper work at [facility]. Please reconsult if future social work needs arise. CSW signing off.   [employer], LCSW [phone]

## 2017-07-10 NOTE — Discharge Summary (Signed)
[place] [facility] [place] NAME: Victor Parks    MR#:  [id number]  DATE OF BIRTH:  12/08/1923  DATE OF ADMISSION:  07/07/2017 ADMITTING PHYSICIAN: Idelle Crouch, MD  DATE OF DISCHARGE: 07/10/2017  PRIMARY CARE PHYSICIAN: [name], MD    ADMISSION DIAGNOSIS:  Closed fracture of left hip, initial encounter ([place]) [S72.002A]  DISCHARGE DIAGNOSIS:  Principal Problem:   Closed left hip fracture ([place]) Active Problems:   Dyspnea   Hypotension   Senile dementia   Closed displaced intertrochanteric fracture of left femur ([place])   SECONDARY DIAGNOSIS:   Past Medical History:  Diagnosis Date  . Bladder cancer ([place])   . Decubitus ulcer   . Hypertension   . Muscle weakness   . UTI (urinary tract infection)     HOSPITAL COURSE:   1.  Left femoral neck fracture status post repair.  Physical therapy recommended rehab.  Patient has a bed at [facility] for today.  PRN pain medications.  Try to convert to Tylenol as quickly as possible. 2.  GERD on Protonix 3.  History of bladder cancer and frequent UTIs.  Unfortunately no urine was sent off during the hospital course.  Prophylactic antibiotics given for hip fracture.  Can go back on nitrofurantoin prophylactically. 4.  History of hypertension.  Blood pressure acceptable today. 5.  History of dementia  DISCHARGE CONDITIONS:   Satisfactory  CONSULTS OBTAINED:  Treatment Team:  Dereck Leep, MD Stark Bray, MD  DRUG ALLERGIES:   Allergies  Allergen Reactions  . Ciprofloxacin Anaphylaxis    DISCHARGE MEDICATIONS:   Allergies as of 07/10/2017      Reactions   Ciprofloxacin Anaphylaxis      Medication List    STOP taking these medications   aspirin EC 81 MG tablet   loperamide 2 MG tablet Commonly known as:  IMODIUM A-D   senna-docusate 8.6-50 MG tablet Commonly known as:  Senokot-S     TAKE these medications   acetaminophen 500 MG tablet Commonly  known as:  TYLENOL Take 500 mg by mouth 3 (three) times daily. 6 am, 2 pm, 10 pm   CALCIUM 1000 + D 1000-800 MG-UNIT Tabs Generic drug:  Calcium Carb-Cholecalciferol Take 1 tablet by mouth daily.   CALMOSEPTINE 0.44-20.6 % Oint Generic drug:  Menthol-Zinc Oxide Apply 1 application topically every 12 (twelve) hours as needed. To buttocks for rash   docusate sodium 100 MG capsule Commonly known as:  COLACE Take 1 capsule (100 mg total) by mouth 2 (two) times daily.   enoxaparin 40 MG/0.4ML injection Commonly known as:  LOVENOX Inject 0.4 mLs (40 mg total) into the skin daily. Start taking on:  07/11/2017   ferrous sulfate 325 (65 FE) MG tablet Take 325 mg by mouth daily with breakfast.   folic acid-vitamin b complex-vitamin c-selenium-zinc 3 MG Tabs tablet Take 1 tablet by mouth daily.   HYDROcodone-acetaminophen 5-325 MG tablet Commonly known as:  NORCO/VICODIN Take 1 tablet by mouth every 6 (six) hours as needed for moderate pain.   lactulose 10 GM/15ML solution Commonly known as:  CHRONULAC Take 45 mLs (30 g total) by mouth daily as needed for mild constipation or severe constipation.   NUTRITIONAL DRINK Liqd Take 60 mLs by mouth 2 (two) times daily. MED PASS   pantoprazole 40 MG tablet Commonly known as:  PROTONIX Take 1 tablet (40 mg total) by mouth 2 (two) times daily.   potassium citrate 10 MEQ (1080 MG)  SR tablet Commonly known as:  UROCIT-K Take 10 mEq by mouth 2 (two) times daily.   simethicone 80 MG chewable tablet Commonly known as:  MYLICON Chew 80 mg by mouth every 6 (six) hours as needed for flatulence.        DISCHARGE INSTRUCTIONS:   Follow-up with Dr. at rehab 1 day Follow-up with Physical therapy Follow-up with orthopedic surgery as follow-up  If you experience worsening of your admission symptoms, develop shortness of breath, life threatening emergency, suicidal or homicidal thoughts you must seek medical attention immediately by calling  911 or calling your MD immediately  if symptoms less severe.  You Must read complete instructions/literature along with all the possible adverse reactions/side effects for all the Medicines you take and that have been prescribed to you. Take any new Medicines after you have completely understood and accept all the possible adverse reactions/side effects.   Please note  You were cared for by a hospitalist during your hospital stay. If you have any questions about your discharge medications or the care you received while you were in the hospital after you are discharged, you can call the unit and asked to speak with the hospitalist on call if the hospitalist that took care of you is not available. Once you are discharged, your primary care physician will handle any further medical issues. Please note that NO REFILLS for any discharge medications will be authorized once you are discharged, as it is imperative that you return to your primary care physician (or establish a relationship with a primary care physician if you do not have one) for your aftercare needs so that they can reassess your need for medications and monitor your lab values.    Today   CHIEF COMPLAINT:   Chief Complaint  Patient presents with  . Fall  . Hip Pain    HISTORY OF PRESENT ILLNESS:  Victor Parks  is a 83 y.o. male with a known history of dementia presented after a fall and hip pain.  Found to have a left femoral neck fracture.   VITAL SIGNS:  Blood pressure (!) 107/52, pulse 86, temperature 98.1 F (36.7 C), temperature source Oral, resp. rate 20, height 5\' 9"  (1.753 m), weight 71.2 kg (156 lb 14.4 oz), SpO2 92 %.   PHYSICAL EXAMINATION:  GENERAL:  83 y.o.-year-old patient lying in the bed with no acute distress.  EYES: Pupils equal, round, reactive to light and accommodation. No scleral icterus. Extraocular muscles intact.  HEENT: Head atraumatic, normocephalic. Oropharynx and nasopharynx clear.  NECK:   Supple, no jugular venous distention. No thyroid enlargement, no tenderness.  LUNGS: Normal breath sounds bilaterally, no wheezing, rales,rhonchi or crepitation. No use of accessory muscles of respiration.  CARDIOVASCULAR: S1, S2 normal. No murmurs, rubs, or gallops.  ABDOMEN: Soft, non-tender, non-distended. Bowel sounds present. No organomegaly or mass.  EXTREMITIES: No pedal edema, cyanosis, or clubbing.  NEUROLOGIC: Cranial nerves II through XII are intact. Muscle strength 5/5 in all extremities. Sensation intact. Gait not checked.  PSYCHIATRIC: The patient is alert and oriented x 3.  SKIN: No obvious rash, lesion, or ulcer.   DATA REVIEW:   CBC Recent Labs  Lab 07/08/17 0541  WBC 9.5  HGB 11.0*  HCT 32.5*  PLT 157    Chemistries  Recent Labs  Lab 07/08/17 0541  NA 138  K 4.4  CL 105  CO2 25  GLUCOSE 147*  BUN 24*  CREATININE 1.12  CALCIUM 8.2*  AST 27  ALT 15*  ALKPHOS 84  BILITOT 0.8     Management plans discussed with the patient, and he is in agreement.  Spoke with Vaughan Basta yesterday about plan.  CODE STATUS:     Code Status Orders  (From admission, onward)        Start     Ordered   07/07/17 1336  Do not attempt resuscitation (DNR)  Continuous    Question Answer Comment  In the event of cardiac or respiratory ARREST Do not call a "code blue"   In the event of cardiac or respiratory ARREST Do not perform Intubation, CPR, defibrillation or ACLS   In the event of cardiac or respiratory ARREST Use medication by any route, position, wound care, and other measures to relive pain and suffering. May use oxygen, suction and manual treatment of airway obstruction as needed for comfort.      07/07/17 1335    Code Status History    Date Active Date Inactive Code Status Order ID Comments User Context   07/07/2017 13:29 07/07/2017 13:35 Full Code [id number]  Idelle Crouch, MD Inpatient   06/13/2016 05:31 06/22/2016 15:14 DNR [id number]  Saundra Shelling, MD  Inpatient   05/29/2016 08:18 05/29/2016 17:01 DNR [id number]  Merlyn Lot, MD ED    Advance Directive Documentation     Most Recent Value  Type of Advance Directive  Healthcare Power of [place], Living will  Pre-existing out of facility DNR order (yellow form or pink MOST form)  No data  "MOST" Form in Place?  No data      TOTAL TIME TAKING CARE OF THIS PATIENT: 34 minutes.    Loletha Grayer M.D on 07/10/2017 at 7:58 AM  Between 7am to 6pm - Pager - [phone]  After 6pm go to [email or web address] - password [email or web address] [facility]  [phone]  CC: Primary care physician; [name], MD

## 2017-07-12 ENCOUNTER — Encounter: Payer: Self-pay | Admitting: Family Medicine

## 2017-09-14 ENCOUNTER — Encounter: Payer: Self-pay | Admitting: *Deleted

## 2017-09-14 ENCOUNTER — Observation Stay
Admission: EM | Admit: 2017-09-14 | Discharge: 2017-09-16 | Disposition: A | Discharge status: Home Health | Payer: Medicare Other | Attending: Internal Medicine | Admitting: Internal Medicine

## 2017-09-14 ENCOUNTER — Emergency Department: Payer: Medicare Other

## 2017-09-14 ENCOUNTER — Other Ambulatory Visit: Payer: Self-pay

## 2017-09-14 DIAGNOSIS — D62 Acute posthemorrhagic anemia: Secondary | ICD-10-CM | POA: Diagnosis not present

## 2017-09-14 DIAGNOSIS — M6281 Muscle weakness (generalized): Secondary | ICD-10-CM | POA: Diagnosis not present

## 2017-09-14 DIAGNOSIS — K922 Gastrointestinal hemorrhage, unspecified: Secondary | ICD-10-CM

## 2017-09-14 DIAGNOSIS — K920 Hematemesis: Secondary | ICD-10-CM | POA: Diagnosis not present

## 2017-09-14 DIAGNOSIS — E43 Unspecified severe protein-calorie malnutrition: Secondary | ICD-10-CM | POA: Diagnosis not present

## 2017-09-14 DIAGNOSIS — I4510 Unspecified right bundle-branch block: Secondary | ICD-10-CM | POA: Diagnosis not present

## 2017-09-14 DIAGNOSIS — Z8551 Personal history of malignant neoplasm of bladder: Secondary | ICD-10-CM | POA: Diagnosis not present

## 2017-09-14 DIAGNOSIS — R112 Nausea with vomiting, unspecified: Secondary | ICD-10-CM | POA: Diagnosis present

## 2017-09-14 DIAGNOSIS — I10 Essential (primary) hypertension: Secondary | ICD-10-CM

## 2017-09-14 DIAGNOSIS — N200 Calculus of kidney: Secondary | ICD-10-CM | POA: Insufficient documentation

## 2017-09-14 DIAGNOSIS — Z881 Allergy status to other antibiotic agents status: Secondary | ICD-10-CM | POA: Insufficient documentation

## 2017-09-14 DIAGNOSIS — Z8711 Personal history of peptic ulcer disease: Secondary | ICD-10-CM | POA: Diagnosis not present

## 2017-09-14 DIAGNOSIS — K2270 Barrett's esophagus without dysplasia: Secondary | ICD-10-CM | POA: Insufficient documentation

## 2017-09-14 DIAGNOSIS — I2510 Atherosclerotic heart disease of native coronary artery without angina pectoris: Secondary | ICD-10-CM | POA: Insufficient documentation

## 2017-09-14 DIAGNOSIS — Z79899 Other long term (current) drug therapy: Secondary | ICD-10-CM | POA: Diagnosis not present

## 2017-09-14 DIAGNOSIS — K449 Diaphragmatic hernia without obstruction or gangrene: Secondary | ICD-10-CM | POA: Diagnosis not present

## 2017-09-14 DIAGNOSIS — I700 Atherosclerosis of aorta: Secondary | ICD-10-CM | POA: Insufficient documentation

## 2017-09-14 DIAGNOSIS — Z87891 Personal history of nicotine dependence: Secondary | ICD-10-CM | POA: Insufficient documentation

## 2017-09-14 DIAGNOSIS — M8580 Other specified disorders of bone density and structure, unspecified site: Secondary | ICD-10-CM | POA: Insufficient documentation

## 2017-09-14 DIAGNOSIS — R109 Unspecified abdominal pain: Secondary | ICD-10-CM | POA: Diagnosis present

## 2017-09-14 DIAGNOSIS — F0390 Unspecified dementia without behavioral disturbance: Secondary | ICD-10-CM | POA: Diagnosis not present

## 2017-09-14 LAB — TYPE AND SCREEN
ABO/RH(D): A POS
ANTIBODY SCREEN: NEGATIVE

## 2017-09-14 LAB — COMPREHENSIVE METABOLIC PANEL
ALK PHOS: 82 U/L (ref 38–126)
ALT: 16 U/L — ABNORMAL LOW (ref 17–63)
ANION GAP: 11 (ref 5–15)
AST: 23 U/L (ref 15–41)
Albumin: 4 g/dL (ref 3.5–5.0)
BILIRUBIN TOTAL: 0.7 mg/dL (ref 0.3–1.2)
BUN: 36 mg/dL — ABNORMAL HIGH (ref 6–20)
CALCIUM: 9.1 mg/dL (ref 8.9–10.3)
CO2: 26 mmol/L (ref 22–32)
Chloride: 99 mmol/L — ABNORMAL LOW (ref 101–111)
Creatinine, Ser: 0.87 mg/dL (ref 0.61–1.24)
GFR calc Af Amer: >60 mL/min (ref >60)
GFR calc non Af Amer: >60 mL/min (ref >60)
GLUCOSE: 156 mg/dL — AB (ref 65–99)
POTASSIUM: 4.2 mmol/L (ref 3.5–5.1)
Sodium: 136 mmol/L (ref 135–145)
TOTAL PROTEIN: 7.2 g/dL (ref 6.5–8.1)

## 2017-09-14 LAB — CBC
HCT: 39.9 % — ABNORMAL LOW (ref 40.0–52.0)
Hemoglobin: 13.4 g/dL (ref 13.0–18.0)
MCH: 31.2 pg (ref 26.0–34.0)
MCHC: 33.6 g/dL (ref 32.0–36.0)
MCV: 93 fL (ref 80.0–100.0)
Platelets: 227 10*3/uL (ref 150–440)
RBC: 4.29 MIL/uL — AB (ref 4.40–5.90)
RDW: 14.1 % (ref 11.5–14.5)
WBC: 18.2 10*3/uL — ABNORMAL HIGH (ref 3.8–10.6)

## 2017-09-14 LAB — URINALYSIS, ROUTINE W REFLEX MICROSCOPIC
BACTERIA UA: NONE SEEN
Bilirubin Urine: NEGATIVE
Glucose, UA: NEGATIVE mg/dL
Hgb urine dipstick: NEGATIVE
Ketones, ur: 5 mg/dL — AB
Leukocytes, UA: NEGATIVE
Nitrite: POSITIVE — AB
PH: 8 (ref 5.0–8.0)
Protein, ur: NEGATIVE mg/dL
RBC / HPF: NONE SEEN RBC/hpf (ref 0–5)
SPECIFIC GRAVITY, URINE: 1.013 (ref 1.005–1.030)
WBC, UA: NONE SEEN WBC/hpf (ref 0–5)

## 2017-09-14 LAB — HEMOGLOBIN: Hemoglobin: 12.9 g/dL — ABNORMAL LOW (ref 13.0–18.0)

## 2017-09-14 LAB — TROPONIN I

## 2017-09-14 LAB — MRSA PCR SCREENING: MRSA by PCR: NEGATIVE

## 2017-09-14 MED ORDER — BISACODYL 10 MG RE SUPP: 10.0000 mg | Freq: Every day | RECTAL | Status: DC | PRN

## 2017-09-14 MED ORDER — DOCUSATE SODIUM 100 MG PO CAPS: 100.0000 mg | ORAL_CAPSULE | Freq: Two times a day (BID) | ORAL | Status: DC

## 2017-09-14 MED ORDER — ONDANSETRON HCL 4 MG PO TABS: 4.0000 mg | ORAL_TABLET | Freq: Four times a day (QID) | ORAL | Status: DC | PRN

## 2017-09-14 MED ORDER — HYDRALAZINE HCL 20 MG/ML IJ SOLN: 10.0000 mg | INTRAMUSCULAR | Status: DC | PRN

## 2017-09-14 MED ORDER — PANTOPRAZOLE SODIUM 40 MG IV SOLR: 40.0000 mg | Freq: Two times a day (BID) | INTRAVENOUS | Status: DC

## 2017-09-14 MED ORDER — SODIUM CHLORIDE 0.9 % IV SOLN
80.0000 mg | Freq: Once | INTRAVENOUS | Status: AC
  Administered 2017-09-14: 12:00:00 80 mg via INTRAVENOUS
  Filled 2017-09-14: qty 80

## 2017-09-14 MED ORDER — HYDROCODONE-ACETAMINOPHEN 5-325 MG PO TABS: 1.0000 | ORAL_TABLET | Freq: Four times a day (QID) | ORAL | Status: DC | PRN

## 2017-09-14 MED ORDER — SODIUM CHLORIDE 0.9 % IV SOLN
8.0000 mg/h | INTRAVENOUS | Status: DC
  Administered 2017-09-14 – 2017-09-15 (×3): 8 mg/h via INTRAVENOUS
  Filled 2017-09-14 (×5): qty 80

## 2017-09-14 MED ORDER — ACETAMINOPHEN 325 MG PO TABS
650.0000 mg | ORAL_TABLET | Freq: Four times a day (QID) | ORAL | Status: DC | PRN
  Administered 2017-09-14: 21:00:00 650 mg via ORAL
  Filled 2017-09-14: qty 2

## 2017-09-14 MED ORDER — IOPAMIDOL (ISOVUE-300) INJECTION 61%
100.0000 mL | Freq: Once | INTRAVENOUS | Status: AC | PRN
  Administered 2017-09-14: 100 mL via INTRAVENOUS

## 2017-09-14 MED ORDER — NITROFURANTOIN MONOHYD MACRO 100 MG PO CAPS
100.0000 mg | ORAL_CAPSULE | Freq: Every day | ORAL | Status: DC
  Administered 2017-09-14: 21:00:00 100 mg via ORAL
  Filled 2017-09-14 (×2): qty 1

## 2017-09-14 MED ORDER — ACETAMINOPHEN 650 MG RE SUPP: 650.0000 mg | Freq: Four times a day (QID) | RECTAL | Status: DC | PRN

## 2017-09-14 MED ORDER — FERROUS SULFATE 325 (65 FE) MG PO TABS
325.0000 mg | ORAL_TABLET | Freq: Every day | ORAL | Status: DC
  Administered 2017-09-15: 09:00:00 325 mg via ORAL
  Filled 2017-09-14 (×2): qty 1

## 2017-09-14 MED ORDER — ONDANSETRON HCL 4 MG/2ML IJ SOLN: 4.0000 mg | Freq: Four times a day (QID) | INTRAMUSCULAR | Status: DC | PRN

## 2017-09-14 MED ORDER — MORPHINE SULFATE (PF) 2 MG/ML IV SOLN: 2.0000 mg | INTRAVENOUS | Status: DC | PRN

## 2017-09-14 MED ORDER — DOCUSATE SODIUM 100 MG PO CAPS
100.0000 mg | ORAL_CAPSULE | Freq: Two times a day (BID) | ORAL | Status: DC
  Administered 2017-09-14 – 2017-09-15 (×2): 100 mg via ORAL
  Filled 2017-09-14 (×4): qty 1

## 2017-09-14 MED ORDER — ONDANSETRON HCL 4 MG/2ML IJ SOLN
4.0000 mg | Freq: Once | INTRAMUSCULAR | Status: AC
  Administered 2017-09-14: 4 mg via INTRAVENOUS
  Filled 2017-09-14: qty 2

## 2017-09-14 MED ORDER — POTASSIUM CITRATE ER 10 MEQ (1080 MG) PO TBCR
10.0000 meq | EXTENDED_RELEASE_TABLET | Freq: Two times a day (BID) | ORAL | Status: DC
  Administered 2017-09-14 – 2017-09-15 (×2): 10 meq via ORAL
  Filled 2017-09-14 (×6): qty 1

## 2017-09-14 MED ORDER — LORAZEPAM 2 MG/ML IJ SOLN: 0.5000 mg | INTRAMUSCULAR | Status: DC | PRN

## 2017-09-14 MED ORDER — SODIUM CHLORIDE 0.9 % IV SOLN
INTRAVENOUS | Status: DC
  Administered 2017-09-14: 15:00:00 via INTRAVENOUS

## 2017-09-14 NOTE — ED Notes (Signed)
Call to [facility], unable to reach nursing staff at this time.

## 2017-09-14 NOTE — ED Provider Notes (Signed)
[facility] Emergency Department Provider Note ____________________________________________   I have reviewed the triage vital signs and the triage nursing note.  HISTORY  Chief Complaint Hematemesis   Historian Level 5 Caveat History Limited by patient poor historian Daughter provides some history EMS report from nursing home  HPI Victor Parks is a 84 y.o. male from nursing home, sent in after being found in a puddle of black emesis this morning.  This was reportedly fairly large.  Daughter states patient has been complaining of some belly pain yesterday and also today since she has been in the room with him.  He seems to be grunting and it seems like it is from his abdomen.  He had bladder cancer years ago and has a urine, looks like ileal conduit.  Daughter states that at one point he had had GI bleeding and had endoscopy and colonoscopy, no certain cause was found, but no additional interventions were pursued given his age and medical status.  No reported constipation or diarrhea or black or bloody stools.  Not on any blood thinners, on MAR report or per daughter.   Past Medical History:  Diagnosis Date  . Bladder cancer ([place])   . Decubitus ulcer   . Hypertension   . Muscle weakness   . UTI (urinary tract infection)     Patient Active Problem List   Diagnosis Date Noted  . Closed left hip fracture ([place]) 07/07/2017  . Hypotension 07/07/2017  . 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 Closed displaced intertrochanteric fracture of left femur ([place])   . Severe sepsis with septic shock ([place]) 06/17/2016  . Acute blood loss anemia 06/17/2016  . Septic shock ([place]) 06/16/2016  . Protein-calorie malnutrition, severe 06/15/2016  . Abdominal pain   . Dyspnea 06/13/2016  . Pressure injury of skin 06/13/2016    Past Surgical History:  Procedure Laterality Date  . ESOPHAGOGASTRODUODENOSCOPY (EGD) WITH PROPOFOL N/A 06/20/2016   Procedure:  ESOPHAGOGASTRODUODENOSCOPY (EGD) WITH PROPOFOL;  Surgeon: Jonathon Bellows, MD;  Location: [facility] ENDOSCOPY;  Service: Endoscopy;  Laterality: N/A;  . FEMUR IM NAIL Left 07/07/2017   Procedure: INTRAMEDULLARY (IM) NAIL FEMORAL;  Surgeon: Stark Bray, MD;  Location: [facility] ORS;  Service: Orthopedics;  Laterality: Left;  . ILEAL POUCH     s/p bladder cancer  . none      Prior to Admission medications   Medication Sig Start Date End Date Taking? Authorizing Provider  acetaminophen (TYLENOL) 500 MG tablet Take 1,000 mg by mouth 3 (three) times daily. 8 am, 2 pm, 8 pm   Yes [provider]  Calcium Carb-Cholecalciferol (CALCIUM 1000 + D) 1000-800 MG-UNIT TABS Take 1 tablet by mouth daily.   Yes [provider]  docusate sodium (COLACE) 100 MG capsule Take 1 capsule (100 mg total) by mouth 2 (two) times daily. 07/10/17  Yes Wieting, Richard, MD  ferrous sulfate 325 (65 FE) MG tablet Take 325 mg by mouth daily with breakfast.   Yes [provider]  HYDROcodone-acetaminophen (NORCO/VICODIN) 5-325 MG tablet Take 1 tablet by mouth every 6 (six) hours as needed for moderate pain. 07/10/17  Yes Wieting, Richard, MD  Menthol-Zinc Oxide (CALMOSEPTINE) 0.44-20.6 % OINT Apply 1 application topically every 12 (twelve) hours as needed. To buttocks for rash   Yes [provider]  Multiple Vitamin (DAILY VITE PO) Take 1 tablet by mouth daily.   Yes [provider]  pantoprazole (PROTONIX) 40 MG tablet Take 1 tablet (40 mg total) by mouth 2 (two) times  daily. 07/10/17  Yes Wieting, Richard, MD  potassium citrate (UROCIT-K) 10 MEQ (1080 MG) SR tablet Take 10 mEq by mouth 2 (two) times daily.   Yes [provider]  simethicone (MYLICON) 80 MG chewable tablet Chew 80 mg by mouth 4 (four) times daily.    Yes [provider]  traMADol (ULTRAM) 50 MG tablet Take 50 mg by mouth 3 (three) times daily.   Yes [provider]  enoxaparin (LOVENOX) 40 MG/0.4ML  injection Inject 0.4 mLs (40 mg total) into the skin daily. Patient not taking: Reported on 09/14/2017 07/11/17   Loletha Grayer, MD  lactulose (CHRONULAC) 10 GM/15ML solution Take 45 mLs (30 g total) by mouth daily as needed for mild constipation or severe constipation. Patient not taking: Reported on 09/14/2017 07/10/17   Loletha Grayer, MD    Allergies  Allergen Reactions  . Ciprofloxacin Anaphylaxis    No family history on file.  Social History Social History   Tobacco Use  . Smoking status: Former Research scientist (life sciences)  . Smokeless tobacco: Never Used  Substance Use Topics  . Alcohol use: No  . Drug use: No    Review of Systems  Constitutional: Negative for fever. Eyes: Negative for visual changes. ENT: Negative for sore throat. Cardiovascular: Negative for chest pain. Respiratory: Negative for shortness of breath. Gastrointestinal: Negative for diarrhea. Genitourinary: Negative for dark urine. Musculoskeletal: Negative for back pain. Skin: Negative for rash. Neurological: Negative for headache.  ____________________________________________   PHYSICAL EXAM:  VITAL SIGNS: ED Triage Vitals  Enc Vitals Group     BP 09/14/17 0758 (!) 143/72     Pulse Rate 09/14/17 0758 79     Resp 09/14/17 0758 18     Temp 09/14/17 0758 97.6 F (36.4 C)     Temp Source 09/14/17 0758 Oral     SpO2 09/14/17 0758 95 %     Weight --      Height --      Head Circumference --      Peak Flow --      Pain Score 09/14/17 0807 0     Pain Loc --      Pain Edu? --      Excl. in [place]? --      Constitutional: Alert and operative, disoriented, poor historian. Well appearing overall, but he does seem to continue shifting himself in the bed and occasionally grunting although he is unable to tell me why he is doing that. HEENT   Head: Normocephalic and atraumatic.      Eyes: Conjunctivae are normal. Pupils equal and round.       Ears:         Nose: No congestion/rhinnorhea.   Mouth/Throat:  Mucous membranes are moist.   Neck: No stridor. Cardiovascular/Chest: Normal rate, regular rhythm.  No murmurs, rubs, or gallops. Respiratory: Normal respiratory effort without tachypnea nor retractions. Breath sounds are clear and equal bilaterally. No wheezes/rales/rhonchi. Gastrointestinal: Soft. No distention, no guarding, no rebound.  Question mild upper abdominal tenderness, he does have an ostomy bag at the right abdomen with normal-appearing urine Genitourinary/rectal: Nontender rectal exam.  Dark stool, Hemoccult positive. Musculoskeletal: Nontender with normal range of motion in all extremities. No joint effusions.  No lower extremity tenderness.  No edema. Neurologic:  Normal speech and language. No gross or focal neurologic deficits are appreciated. Skin:  Skin is warm, dry and intact. No rash noted. Psychiatric: Mood and affect are normal. Poor historian.   ____________________________________________  LABS (pertinent positives/negatives) I,  Lisa Roca, MD the attending physician have reviewed the labs noted below.  Labs Reviewed  COMPREHENSIVE METABOLIC PANEL - Abnormal; Notable for the following components:      Result Value   Chloride 99 (*)    Glucose, Bld 156 (*)    BUN 36 (*)    ALT 16 (*)    All other components within normal limits  CBC - Abnormal; Notable for the following components:   WBC 18.2 (*)    RBC 4.29 (*)    HCT 39.9 (*)    All other components within normal limits  URINALYSIS, ROUTINE W REFLEX MICROSCOPIC - Abnormal; Notable for the following components:   Color, Urine YELLOW (*)    APPearance TURBID (*)    Ketones, ur 5 (*)    Nitrite POSITIVE (*)    Squamous Epithelial / LPF 0-5 (*)    All other components within normal limits  TROPONIN I  POC OCCULT BLOOD, ED  TYPE AND SCREEN    ____________________________________________    EKG I, Lisa Roca, MD, the attending physician have personally viewed and interpreted all ECGs.  89  bpm.  Normal sinus rhythm.  Right bundle branch block.  Left axis deviation.  Nonspecific ST and T wave ____________________________________________  RADIOLOGY All Xrays were viewed by me.  Imaging interpreted by Radiologist, and I, Lisa Roca, MD the attending physician have reviewed the radiologist interpretation noted below.  CT abdomen and pelvis with contrast:  IMPRESSION: 1. No definitive findings to explain the patient's symptoms. Small amount of dependent high attenuation within dilated distal small bowel is nonspecific. Difficult to exclude hemorrhage. 2. Mildly dilated and fluid-filled distal small bowel. Partial small bowel obstruction is not excluded. 3. Dilated and fluid-filled distal esophagus can be seen with reflux. 4. Aortic atherosclerosis (ICD10-170.0). Coronary artery calcification. Right renal stone. __________________________________________  PROCEDURES  Procedure(s) performed: None  Critical Care performed: None   ____________________________________________  ED COURSE / ASSESSMENT AND PLAN  Pertinent labs & imaging results that were available during my care of the patient were reviewed by me and considered in my medical decision making (see chart for details).  Sent in by nursing home staff due to black emesis.  No hypotension or tachycardia, and hemoglobin is not anemic.  However given discussion with daughter, he has been complaining of abdominal pain, he is found to have an elevated white blood cell count and has been complaining of pain it sounds like for 2 days, nonspecific abdominal, and will pursue CT imaging of the abdomen.  He would likely need observation for GI bleeding, his Hemoccult testing is positive in the stool.  CT scan of the abdomen without focal finding.  No additional drop in blood pressure.  He has had nausea here requiring Zofran.  DIFFERENTIAL DIAGNOSIS: Including but not limited to sources of upper GI bleeding, lower GI  bleeding, mass, obstruction, ischemic colitis, ACS, anemia, gastritis, etc.  CONSULTATIONS:   Hospitalist for admission.   Patient / Family / Caregiver informed of clinical course, medical decision-making process, and agree with plan.    ___________________________________________   FINAL CLINICAL IMPRESSION(S) / ED DIAGNOSES   Final diagnoses:  Upper GI bleeding  Non-intractable vomiting with nausea, unspecified vomiting type      ___________________________________________        Note: This dictation was prepared with Dragon dictation. Any transcriptional errors that result from this process are unintentional    Lisa Roca, MD 09/14/17 [phone]

## 2017-09-14 NOTE — ED Notes (Signed)
Emptied urostomy.  Odor noted from urine.  Sent for urinalysis

## 2017-09-14 NOTE — ED Notes (Signed)
Unable to obtain full set of orthostatic vitals.  Pt will not stand; this RN and ED tech attempted to stand pt with walker, pt pushing against [place] and states, "I don't want to, I want to sit down."  EDP notified.

## 2017-09-14 NOTE — Clinical Social Work Note (Signed)
CSW received consult that this patient admitted from an ALF. CSW will assess when able.  Victor Parks, MSW, Latanya Presser [phone]

## 2017-09-14 NOTE — ED Notes (Signed)
Turned pt and undressed him.  No pressure sore as previously described seen.  Some rendess noted.  Removed his depend and placed on clean pad. Placed pt on monitor.  Will continue to monitor.

## 2017-09-14 NOTE — ED Triage Notes (Signed)
Pt is from [place] where he lives in [place] with his wife.  Pt has dementia, he is oriented to self.  EMS tells me that they believe that pt vomited blood. They state that he had one episode of "dark, almost black" emesis this am. They tell me that pt is on blood thinners per SNF staff but no blood thinner appears listed on MAR, will check with SNF. Pt appears in no distress at this time. IV in left AC by ems

## 2017-09-14 NOTE — ED Notes (Signed)
Called lab to add troponin on to his blood that was sent previously

## 2017-09-14 NOTE — ED Notes (Signed)
Pt was changed into clean brief, and linens changed by this tech and [employer], rn

## 2017-09-14 NOTE — H&P (Signed)
History and Physical    [name] CZY:[id number] DOB: [birth year] DOA: 09/14/2017  Referring physician: Dr. Reita Cliche PCP: [name], MD  Specialists: none  Chief Complaint: Hematemesis  HPI: Victor Parks is a 84 y.o. male has a past medical history significant for HTN, dementia, and bladder cancer who resides at ALF brought to ER for hematemesis. Pt is confused and demented. Unable to provide hx. In ER, hgb OK but pt is hypertensive and guaiac positive. He is now admitted.  Review of Systems: unable to obtain from patient due to dementia  Past Medical History:  Diagnosis Date  . Bladder cancer ([place])   . Decubitus ulcer   . Hypertension   . Muscle weakness   . UTI (urinary tract infection)    Past Surgical History:  Procedure Laterality Date  . ESOPHAGOGASTRODUODENOSCOPY (EGD) WITH PROPOFOL N/A 06/20/2016   Procedure: ESOPHAGOGASTRODUODENOSCOPY (EGD) WITH PROPOFOL;  Surgeon: Jonathon Bellows, MD;  Location: [facility] ENDOSCOPY;  Service: Endoscopy;  Laterality: N/A;  . FEMUR IM NAIL Left 07/07/2017   Procedure: INTRAMEDULLARY (IM) NAIL FEMORAL;  Surgeon: Stark Bray, MD;  Location: [facility] ORS;  Service: Orthopedics;  Laterality: Left;  . ILEAL POUCH     s/p bladder cancer  . none     Social History:  reports that he has quit smoking. he has never used smokeless tobacco. He reports that he does not drink alcohol or use drugs.  Allergies  Allergen Reactions  . Ciprofloxacin Anaphylaxis    History reviewed. No pertinent family history.  Prior to Admission medications   Medication Sig Start Date End Date Taking? Authorizing Provider  acetaminophen (TYLENOL) 500 MG tablet Take 1,000 mg by mouth 3 (three) times daily. 8 am, 2 pm, 8 pm   Yes [provider]  Calcium Carb-Cholecalciferol (CALCIUM 1000 + D) 1000-800 MG-UNIT TABS Take 1 tablet by mouth daily.   Yes [provider]  docusate sodium (COLACE) 100 MG capsule Take 1 capsule (100 mg total) by  mouth 2 (two) times daily. 07/10/17  Yes Wieting, Richard, MD  ferrous sulfate 325 (65 FE) MG tablet Take 325 mg by mouth daily with breakfast.   Yes [provider]  HYDROcodone-acetaminophen (NORCO/VICODIN) 5-325 MG tablet Take 1 tablet by mouth every 6 (six) hours as needed for moderate pain. 07/10/17  Yes Wieting, Richard, MD  Menthol-Zinc Oxide (CALMOSEPTINE) 0.44-20.6 % OINT Apply 1 application topically every 12 (twelve) hours as needed. To buttocks for rash   Yes [provider]  Multiple Vitamin (DAILY VITE PO) Take 1 tablet by mouth daily.   Yes [provider]  pantoprazole (PROTONIX) 40 MG tablet Take 1 tablet (40 mg total) by mouth 2 (two) times daily. 07/10/17  Yes Wieting, Richard, MD  potassium citrate (UROCIT-K) 10 MEQ (1080 MG) SR tablet Take 10 mEq by mouth 2 (two) times daily.   Yes [provider]  simethicone (MYLICON) 80 MG chewable tablet Chew 80 mg by mouth 4 (four) times daily.    Yes [provider]  traMADol (ULTRAM) 50 MG tablet Take 50 mg by mouth 3 (three) times daily.   Yes [provider]  enoxaparin (LOVENOX) 40 MG/0.4ML injection Inject 0.4 mLs (40 mg total) into the skin daily. Patient not taking: Reported on 09/14/2017 07/11/17   Loletha Grayer, MD  lactulose (CHRONULAC) 10 GM/15ML solution Take 45 mLs (30 g total) by mouth daily as needed for mild constipation or severe constipation. Patient not taking: Reported on 09/14/2017 07/10/17   Loletha Grayer,  MD   Physical Exam: Vitals:   09/14/17 0853 09/14/17 1132 09/14/17 1153 09/14/17 1230  BP: 136/73 (!) 166/91 (!) 153/106 (!) 186/105  Pulse: 72 77 89 89  Resp: 17 20  (!) 22  Temp:      TempSrc:      SpO2: 97% 100%  97%     General:  No apparent distress, WDWN, [place]/AT  Eyes: PERRL, EOMI, no scleral icterus, conjunctiva clear  ENT: moist oropharynx without exudate, TM's benign, dentition poor  Neck: supple, no lymphadenopathy. No bruits or  thyromegaly  Cardiovascular: regular rate without MRG; 2+ peripheral pulses, no JVD, no peripheral edema  Respiratory: CTA biL, good air movement without wheezing, rhonchi or crackled. Respiratory effort normal  Abdomen: soft, non tender to palpation, positive bowel sounds, no guarding, no rebound  Skin: no rashes or lesions  Musculoskeletal: normal bulk and tone, no joint swelling  Psychiatric: lethargic and minimally verbal. Does respond to pain  Neurologic: CN 2-12 grossly intact, Motor strength 5/5 in all 4 groups with symmetric DTR's and non-focal sensory exam  Labs on Admission:  Basic Metabolic Panel: Recent Labs  Lab 09/14/17 0805  NA 136  K 4.2  CL 99*  CO2 26  GLUCOSE 156*  BUN 36*  CREATININE 0.87  CALCIUM 9.1   Liver Function Tests: Recent Labs  Lab 09/14/17 0805  AST 23  ALT 16*  ALKPHOS 82  BILITOT 0.7  PROT 7.2  ALBUMIN 4.0   No results for input(s): LIPASE, AMYLASE in the last 168 hours. No results for input(s): AMMONIA in the last 168 hours. CBC: Recent Labs  Lab 09/14/17 0805  WBC 18.2*  HGB 13.4  HCT 39.9*  MCV 93.0  PLT 227   Cardiac Enzymes: Recent Labs  Lab 09/14/17 0805  TROPONINI <0.03    BNP (last 3 results) No results for input(s): BNP in the last 8760 hours.  ProBNP (last 3 results) No results for input(s): PROBNP in the last 8760 hours.  CBG: No results for input(s): GLUCAP in the last 168 hours.  Radiological Exams on Admission: Ct Abdomen Pelvis W Contrast  Result Date: 09/14/2017 CLINICAL DATA:  Vomiting blood. EXAM: CT ABDOMEN AND PELVIS WITH CONTRAST TECHNIQUE: Multidetector CT imaging of the abdomen and pelvis was performed using the standard protocol following bolus administration of intravenous contrast. CONTRAST:  133mL ISOVUE-300 IOPAMIDOL (ISOVUE-300) INJECTION 61% COMPARISON:  12/18/2016. FINDINGS: Lower chest: Lung bases are clear. Heart is at the upper limits of normal in size. Coronary artery  calcification. No pericardial or pleural effusion. Distal esophagus is dilated and fluid-filled. Hepatobiliary: Liver and gallbladder are unremarkable. No biliary ductal dilatation. Pancreas: Negative. Spleen: Negative. Adrenals/Urinary Tract: Adrenal glands are unremarkable. Low-attenuation lesions in the kidneys measure up to 1.4 cm on the right, similar and likely cysts. A stone is seen in the right kidney. Ureters are decompressed. Cystoprostatectomy with ileal conduit in the right lower quadrant. Stomach/Bowel: Stomach and proximal small bowel are unremarkable. Mildly dilated and fluid-filled distal small bowel postoperative changes in the distal small bowel. Small amount of dependent high attenuation in dilated distal small bowel (series 2, images 99-37), is of uncertain etiology. Colon is unremarkable. Vascular/Lymphatic: Atherosclerotic calcification of the arterial vasculature without abdominal aortic aneurysm. No pathologically enlarged lymph nodes. Surgical clips along both pelvic sidewalls. Reproductive: Prostatectomy. Other: No free fluid.  Mesenteries and peritoneum are unremarkable. Musculoskeletal: Postoperative changes in both proximal femurs. Osteopenia. Degenerative changes in the spine. No worrisome lytic or sclerotic lesions. L1 compression fracture  is unchanged. IMPRESSION: 1. No definitive findings to explain the patient's symptoms. Small amount of dependent high attenuation within dilated distal small bowel is nonspecific. Difficult to exclude hemorrhage. 2. Mildly dilated and fluid-filled distal small bowel. Partial small bowel obstruction is not excluded. 3. Dilated and fluid-filled distal esophagus can be seen with reflux. 4. Aortic atherosclerosis (ICD10-170.0). Coronary artery calcification. Right renal stone. Electronically Signed   By: Lorin Picket M.D.   On: 09/14/2017 11:10    EKG: Independently reviewed.  Assessment/Plan Principal Problem:   GI bleed Active Problems:    Abdominal pain   Senile dementia   HTN (hypertension)   Will begin IV protonix and guaiac stools. Follow hgb. Consult GI. Clear liquid diet. DNR status per family  Diet: clear liquids Fluids: NS@75  DVT Prophylaxis: TED hose  Code Status: DNR  Family Communication: yes  Disposition Plan: ALF  Time spent: 50 min

## 2017-09-15 DIAGNOSIS — K920 Hematemesis: Secondary | ICD-10-CM | POA: Diagnosis not present

## 2017-09-15 DIAGNOSIS — K922 Gastrointestinal hemorrhage, unspecified: Secondary | ICD-10-CM

## 2017-09-15 LAB — COMPREHENSIVE METABOLIC PANEL
ALK PHOS: 68 U/L (ref 38–126)
ALT: 12 U/L — AB (ref 17–63)
AST: 16 U/L (ref 15–41)
Albumin: 3.1 g/dL — ABNORMAL LOW (ref 3.5–5.0)
Anion gap: 7 (ref 5–15)
BILIRUBIN TOTAL: 0.7 mg/dL (ref 0.3–1.2)
BUN: 26 mg/dL — AB (ref 6–20)
CO2: 26 mmol/L (ref 22–32)
CREATININE: 1.01 mg/dL (ref 0.61–1.24)
Calcium: 8.3 mg/dL — ABNORMAL LOW (ref 8.9–10.3)
Chloride: 106 mmol/L (ref 101–111)
GFR calc Af Amer: >60 mL/min (ref >60)
GLUCOSE: 107 mg/dL — AB (ref 65–99)
POTASSIUM: 4.1 mmol/L (ref 3.5–5.1)
Sodium: 139 mmol/L (ref 135–145)
TOTAL PROTEIN: 5.7 g/dL — AB (ref 6.5–8.1)

## 2017-09-15 LAB — CBC
HEMATOCRIT: 32.6 % — AB (ref 40.0–52.0)
Hemoglobin: 11 g/dL — ABNORMAL LOW (ref 13.0–18.0)
MCH: 31.8 pg (ref 26.0–34.0)
MCHC: 33.9 g/dL (ref 32.0–36.0)
MCV: 93.7 fL (ref 80.0–100.0)
PLATELETS: 169 10*3/uL (ref 150–440)
RBC: 3.47 MIL/uL — ABNORMAL LOW (ref 4.40–5.90)
RDW: 14 % (ref 11.5–14.5)
WBC: 12 10*3/uL — AB (ref 3.8–10.6)

## 2017-09-15 NOTE — Care Management Obs Status (Signed)
[facility]oper NOTIFICATION   Patient Details  Name: Victor Parks MRN: [id number] Date of Birth: 03/10/1924   Medicare Observation Status Notification Given:  Yes    Irwin Toran A, RN 09/15/2017, 1:35 PM

## 2017-09-15 NOTE — Consult Note (Signed)
Jonathon Bellows , MD [address] [place], [place], [place], [address] [address] [place], [place], [place], [address] Phone: [phone]  Fax: [phone]  Consultation  Referring Provider:   Dr Darvin Neighbours  Primary Care Physician:  [name], MD Primary Gastroenterologist:  None          Reason for Consultation:   Hematemesis  Date of Admission:  09/14/2017 Date of Consultation:  09/15/2017         HPI:   Victor Parks is a 84 y.o. male he was brought to the ER for hematemesis.    His stool is guaiac positive.  Hemoglobin on admission was 13.4 g and this morning was 11.0 g 2 months back as well it was 11.0 g.No family in the room. The patient appears comfortable, denies any pain. Says he does not wish to have any procedures. He does have dementia I distinctly recall seeing in November 2017 who who is a retired professor of "trees" .  I had performed an upper endoscopy and 2 at the end of 2017 for a similar presentation of hematemesis and noted a very long segment of Barrett's esophagus and esophageal ulcers.  He also had a very large hiatal hernia about 6-7 1 cm long.  At that point of time I did offer an option to repeat the endoscopy with taking multiple biopsies and the family was not very keen on anything invasive at that point of time.  He was subsequently not followed up.  Past Medical History:  Diagnosis Date  . Bladder cancer ([place])   . Decubitus ulcer   . Hypertension   . Muscle weakness   . UTI (urinary tract infection)     Past Surgical History:  Procedure Laterality Date  . ESOPHAGOGASTRODUODENOSCOPY (EGD) WITH PROPOFOL N/A 06/20/2016   Procedure: ESOPHAGOGASTRODUODENOSCOPY (EGD) WITH PROPOFOL;  Surgeon: Jonathon Bellows, MD;  Location: [facility] ENDOSCOPY;  Service: Endoscopy;  Laterality: N/A;  . FEMUR IM NAIL Left 07/07/2017   Procedure: INTRAMEDULLARY (IM) NAIL FEMORAL;  Surgeon: Stark Bray, MD;  Location: [facility] ORS;  Service: Orthopedics;  Laterality: Left;  .  ILEAL POUCH     s/p bladder cancer  . none      Prior to Admission medications   Medication Sig Start Date End Date Taking? Authorizing Provider  acetaminophen (TYLENOL) 500 MG tablet Take 1,000 mg by mouth 3 (three) times daily. 8 am, 2 pm, 8 pm   Yes [provider]  Calcium Carb-Cholecalciferol (CALCIUM 1000 + D) 1000-800 MG-UNIT TABS Take 1 tablet by mouth daily.   Yes [provider]  docusate sodium (COLACE) 100 MG capsule Take 1 capsule (100 mg total) by mouth 2 (two) times daily. 07/10/17  Yes Wieting, Richard, MD  ferrous sulfate 325 (65 FE) MG tablet Take 325 mg by mouth daily with breakfast.   Yes [provider]  HYDROcodone-acetaminophen (NORCO/VICODIN) 5-325 MG tablet Take 1 tablet by mouth every 6 (six) hours as needed for moderate pain. 07/10/17  Yes Wieting, Richard, MD  Menthol-Zinc Oxide (CALMOSEPTINE) 0.44-20.6 % OINT Apply 1 application topically every 12 (twelve) hours as needed. To buttocks for rash   Yes [provider]  Multiple Vitamin (DAILY VITE PO) Take 1 tablet by mouth daily.   Yes [provider]  pantoprazole (PROTONIX) 40 MG tablet Take 1 tablet (40 mg total) by mouth 2 (two) times daily. 07/10/17  Yes Wieting, Richard, MD  potassium citrate (UROCIT-K) 10 MEQ (1080 MG) SR tablet Take 10 mEq  by mouth 2 (two) times daily.   Yes [provider]  simethicone (MYLICON) 80 MG chewable tablet Chew 80 mg by mouth 4 (four) times daily.    Yes [provider]  traMADol (ULTRAM) 50 MG tablet Take 50 mg by mouth 3 (three) times daily.   Yes [provider]  enoxaparin (LOVENOX) 40 MG/0.4ML injection Inject 0.4 mLs (40 mg total) into the skin daily. Patient not taking: Reported on 09/14/2017 07/11/17   Loletha Grayer, MD  lactulose (CHRONULAC) 10 GM/15ML solution Take 45 mLs (30 g total) by mouth daily as needed for mild constipation or severe constipation. Patient not taking: Reported on 09/14/2017  07/10/17   Loletha Grayer, MD    History reviewed. No pertinent family history.   Social History   Tobacco Use  . Smoking status: Former Research scientist (life sciences)  . Smokeless tobacco: Never Used  Substance Use Topics  . Alcohol use: No  . Drug use: No    Allergies as of 09/14/2017 - Review Complete 09/14/2017  Allergen Reaction Noted  . Ciprofloxacin Anaphylaxis 05/29/2016    Review of Systems:    Patient unable to provide any information    Physical Exam:  Vital signs in last 24 hours: Temp:  [98.4 F (36.9 C)-100 F (37.8 C)] 100 F (37.8 C) (02/16 1958) Pulse Rate:  [72-107] 91 (02/16 1958) Resp:  [16-22] 22 (02/16 1958) BP: (107-186)/(42-106) 107/42 (02/16 1958) SpO2:  [93 %-100 %] 96 % (02/16 1958) Weight:  [150 lb 12.7 oz (68.4 kg)] 150 lb 12.7 oz (68.4 kg) (02/17 0500) Last BM Date: (unknown) General:   Pleasant, cooperative in NAD Head:  Normocephalic and atraumatic. Eyes:   No icterus.   Conjunctiva pink. PERRLA. Ears:  Normal auditory acuity. Neck:  Supple; no masses or thyroidomegaly Lungs: Respirations even and unlabored. Lungs clear to auscultation bilaterally.   No wheezes, crackles, or rhonchi.  Heart:  Regular rate and rhythm;  Without murmur, clicks, rubs or gallops Abdomen:  Soft, nondistended, nontender. Normal bowel sounds. No appreciable masses or hepatomegaly.  No rebound or guarding.  Neurologic:  Alert and oriented x0 Skin:  Intact without significant lesions or rashes. Cervical Nodes:  No significant cervical adenopathy. Psych:  Alert and cooperative. Normal affect.  LAB RESULTS: Recent Labs    09/14/17 0805 09/14/17 1808 09/15/17 0750  WBC 18.2*  --  12.0*  HGB 13.4 12.9* 11.0*  HCT 39.9*  --  32.6*  PLT 227  --  169   BMET Recent Labs    09/14/17 0805 09/15/17 0750  NA 136 139  K 4.2 4.1  CL 99* 106  CO2 26 26  GLUCOSE 156* 107*  BUN 36* 26*  CREATININE 0.87 1.01  CALCIUM 9.1 8.3*   LFT Recent Labs    09/15/17 0750  PROT 5.7*    ALBUMIN 3.1*  AST 16  ALT 12*  ALKPHOS 68  BILITOT 0.7   PT/INR No results for input(s): LABPROT, INR in the last 72 hours.  STUDIES: Ct Abdomen Pelvis W Contrast  Result Date: 09/14/2017 CLINICAL DATA:  Vomiting blood. EXAM: CT ABDOMEN AND PELVIS WITH CONTRAST TECHNIQUE: Multidetector CT imaging of the abdomen and pelvis was performed using the standard protocol following bolus administration of intravenous contrast. CONTRAST:  130mL ISOVUE-300 IOPAMIDOL (ISOVUE-300) INJECTION 61% COMPARISON:  12/18/2016. FINDINGS: Lower chest: Lung bases are clear. Heart is at the upper limits of normal in size. Coronary artery calcification. No pericardial or pleural effusion. Distal esophagus is dilated and fluid-filled. Hepatobiliary: Liver  and gallbladder are unremarkable. No biliary ductal dilatation. Pancreas: Negative. Spleen: Negative. Adrenals/Urinary Tract: Adrenal glands are unremarkable. Low-attenuation lesions in the kidneys measure up to 1.4 cm on the right, similar and likely cysts. A stone is seen in the right kidney. Ureters are decompressed. Cystoprostatectomy with ileal conduit in the right lower quadrant. Stomach/Bowel: Stomach and proximal small bowel are unremarkable. Mildly dilated and fluid-filled distal small bowel postoperative changes in the distal small bowel. Small amount of dependent high attenuation in dilated distal small bowel (series 2, images 93-26), is of uncertain etiology. Colon is unremarkable. Vascular/Lymphatic: Atherosclerotic calcification of the arterial vasculature without abdominal aortic aneurysm. No pathologically enlarged lymph nodes. Surgical clips along both pelvic sidewalls. Reproductive: Prostatectomy. Other: No free fluid.  Mesenteries and peritoneum are unremarkable. Musculoskeletal: Postoperative changes in both proximal femurs. Osteopenia. Degenerative changes in the spine. No worrisome lytic or sclerotic lesions. L1 compression fracture is unchanged.  IMPRESSION: 1. No definitive findings to explain the patient's symptoms. Small amount of dependent high attenuation within dilated distal small bowel is nonspecific. Difficult to exclude hemorrhage. 2. Mildly dilated and fluid-filled distal small bowel. Partial small bowel obstruction is not excluded. 3. Dilated and fluid-filled distal esophagus can be seen with reflux. 4. Aortic atherosclerosis (ICD10-170.0). Coronary artery calcification. Right renal stone. Electronically Signed   By: Lorin Picket M.D.   On: 09/14/2017 11:10      Impression / Plan:   Truman Aceituno is a 84 y.o. y/o male, based on his past history and endoscopic evaluation which I had performed towards the end of 2017(found long segement Barrettes, severe esophagitis, esophageal ulcers) the hematemesis may very well be from esophagitis could be from an esophageal ulcer or could be from possible malignancy too as the esophageal ulcers were not evaluated completely at that point of time as the patient and family did not wish to follow-up.   At this point of time his hemoglobin is pretty much stable and is actually at his baseline .  Suggest to watch for 24 hours and if his hemoglobin remains stable to discharge him on a high . Dose of PPI and avoid invasive procedure due to its risks at his age and comorbidities.Discussed the plan with Dr Darvin Neighbours - if family wants to pursue further testing to get back in touch with me.   Thank you for involving me in the care of this patient.      LOS: 0 days   Jonathon Bellows, MD  09/15/2017, 8:42 AM

## 2017-09-15 NOTE — Plan of Care (Signed)
  Nutrition: Adequate nutrition will be maintained 09/15/2017 1742 - Progressing by Oris Drone, RN  Diet advanced from clear liquid to dysphagia I with thin liquids this shift; pt tolerating well; remains on Aspiration Precautions Elimination: Will not experience complications related to urinary retention 09/15/2017 1742 - Progressing by Oris Drone, RN  Urosotomy RLQ in place with foul smelling urine; emptied several times this shift Pain Managment: General experience of comfort will improve 09/15/2017 1742 - Progressing by Oris Drone, RN  No complaints of pain this shift Safety: Ability to remain free from injury will improve 09/15/2017 1742 - Progressing by Oris Drone, RN  Pt remains on High Fall Risks this shift Bowel/Gastric: Will show no signs and symptoms of gastrointestinal bleeding 09/15/2017 1742 - Progressing by Oris Drone, RN   Fluid Volume: Will show no signs and symptoms of excessive bleeding 09/15/2017 1742 - Progressing by Oris Drone, RN

## 2017-09-15 NOTE — Progress Notes (Signed)
[facility] [place] NAME: Victor Parks    MR#:  [id number]  DATE OF BIRTH:  11-Dec-1923  SUBJECTIVE:  CHIEF COMPLAINT:   Chief Complaint  Patient presents with  . Hematemesis   Confused.  No further vomiting or bleeding.  REVIEW OF SYSTEMS:    Review of Systems  Unable to perform ROS: Dementia    DRUG ALLERGIES:   Allergies  Allergen Reactions  . Ciprofloxacin Anaphylaxis    VITALS:  Blood pressure (!) 107/42, pulse 91, temperature 100 F (37.8 C), temperature source Axillary, resp. rate (!) 22, height 5\' 6"  (1.676 m), weight 68.4 kg (150 lb 12.7 oz), SpO2 96 %.  PHYSICAL EXAMINATION:   Physical Exam  GENERAL:  84 y.o.-year-old patient lying in the bed with no acute distress.  EYES: Pupils equal, round, reactive to light and accommodation. No scleral icterus. Extraocular muscles intact.  HEENT: Head atraumatic, normocephalic. Oropharynx and nasopharynx clear.  NECK:  Supple, no jugular venous distention. No thyroid enlargement, no tenderness.  LUNGS: Normal breath sounds bilaterally, no wheezing, rales, rhonchi. No use of accessory muscles of respiration.  CARDIOVASCULAR: S1, S2 normal. No murmurs, rubs, or gallops.  ABDOMEN: Soft, nontender, nondistended. Bowel sounds present. No organomegaly or mass.  Urostomy bag with dark urine EXTREMITIES: No cyanosis, clubbing or edema b/l.    NEUROLOGIC: Cranial nerves II through XII are intact. No focal Motor or sensory deficits b/l.   PSYCHIATRIC: The patient is alert and awake.  Pleasantly confused SKIN: No obvious rash, lesion, or ulcer.   LABORATORY PANEL:   CBC Recent Labs  Lab 09/15/17 0750  WBC 12.0*  HGB 11.0*  HCT 32.6*  PLT 169   ------------------------------------------------------------------------------------------------------------------ Chemistries  Recent Labs  Lab 09/15/17 0750  NA 139  K 4.1  CL 106  CO2 26  GLUCOSE 107*  BUN 26*  CREATININE  1.01  CALCIUM 8.3*  AST 16  ALT 12*  ALKPHOS 68  BILITOT 0.7   ------------------------------------------------------------------------------------------------------------------  Cardiac Enzymes Recent Labs  Lab 09/14/17 0805  TROPONINI <0.03   ------------------------------------------------------------------------------------------------------------------  RADIOLOGY:  Ct Abdomen Pelvis W Contrast  Result Date: 09/14/2017 CLINICAL DATA:  Vomiting blood. EXAM: CT ABDOMEN AND PELVIS WITH CONTRAST TECHNIQUE: Multidetector CT imaging of the abdomen and pelvis was performed using the standard protocol following bolus administration of intravenous contrast. CONTRAST:  171mL ISOVUE-300 IOPAMIDOL (ISOVUE-300) INJECTION 61% COMPARISON:  12/18/2016. FINDINGS: Lower chest: Lung bases are clear. Heart is at the upper limits of normal in size. Coronary artery calcification. No pericardial or pleural effusion. Distal esophagus is dilated and fluid-filled. Hepatobiliary: Liver and gallbladder are unremarkable. No biliary ductal dilatation. Pancreas: Negative. Spleen: Negative. Adrenals/Urinary Tract: Adrenal glands are unremarkable. Low-attenuation lesions in the kidneys measure up to 1.4 cm on the right, similar and likely cysts. A stone is seen in the right kidney. Ureters are decompressed. Cystoprostatectomy with ileal conduit in the right lower quadrant. Stomach/Bowel: Stomach and proximal small bowel are unremarkable. Mildly dilated and fluid-filled distal small bowel postoperative changes in the distal small bowel. Small amount of dependent high attenuation in dilated distal small bowel (series 2, images 54-27), is of uncertain etiology. Colon is unremarkable. Vascular/Lymphatic: Atherosclerotic calcification of the arterial vasculature without abdominal aortic aneurysm. No pathologically enlarged lymph nodes. Surgical clips along both pelvic sidewalls. Reproductive: Prostatectomy. Other: No free fluid.   Mesenteries and peritoneum are unremarkable. Musculoskeletal: Postoperative changes in both proximal femurs. Osteopenia. Degenerative changes in the spine. No worrisome lytic or sclerotic  lesions. L1 compression fracture is unchanged. IMPRESSION: 1. No definitive findings to explain the patient's symptoms. Small amount of dependent high attenuation within dilated distal small bowel is nonspecific. Difficult to exclude hemorrhage. 2. Mildly dilated and fluid-filled distal small bowel. Partial small bowel obstruction is not excluded. 3. Dilated and fluid-filled distal esophagus can be seen with reflux. 4. Aortic atherosclerosis (ICD10-170.0). Coronary artery calcification. Right renal stone. Electronically Signed   By: Lorin Picket M.D.   On: 09/14/2017 11:10     ASSESSMENT AND PLAN:   *Upper GI bleed likely from esophagitis or gastric ulcers.  He has had EGD in the past.  Discussed with Dr. Vicente Males.  Suggested monitoring another 24 hours in the hospital.  Repeat hemoglobin.  At patient's advanced age and with dementia would like to avoid EGD. IV PPI.  *Acute blood loss anemia.  Hemoglobin dropped from 13-11.  Could also be hemodilution.  Repeat labs in the morning.  No need for transfusion at this time.  *Dementia.  Monitor for inpatient delirium.  *DVT prophylaxis with TEDs  All the records are reviewed and case discussed with Care Management/Social Worker Management plans discussed with the patient, family and they are in agreement.  CODE STATUS: DNR  DVT Prophylaxis: SCDs  TOTAL TIME TAKING CARE OF THIS PATIENT: 35 minutes.   POSSIBLE D/C IN 1-2 DAYS, DEPENDING ON CLINICAL CONDITION.  Neita Carp M.D on 09/15/2017 at 12:17 PM  Between 7am to 6pm - Pager - [phone]  After 6pm go to [email or web address] - password [email or web address] [facility]  Office  [phone]  CC: Primary care physician; [name], MD  Note: This dictation was prepared with Dragon dictation  along with smaller phrase technology. Any transcriptional errors that result from this process are unintentional.

## 2017-09-16 DIAGNOSIS — K920 Hematemesis: Secondary | ICD-10-CM | POA: Diagnosis not present

## 2017-09-16 MED ORDER — ADULT MULTIVITAMIN W/MINERALS CH: 1.0000 | ORAL_TABLET | Freq: Every day | ORAL | Status: DC

## 2017-09-16 MED ORDER — TRAMADOL HCL 50 MG PO TABS: 50.0000 mg | ORAL_TABLET | Freq: Three times a day (TID) | ORAL | 0 refills | Status: DC | PRN

## 2017-09-16 MED ORDER — TRAMADOL HCL 50 MG PO TABS: 50.0000 mg | ORAL_TABLET | Freq: Three times a day (TID) | ORAL | Status: DC

## 2017-09-16 MED ORDER — PANTOPRAZOLE SODIUM 40 MG PO TBEC: 40.0000 mg | DELAYED_RELEASE_TABLET | Freq: Two times a day (BID) | ORAL | Status: DC

## 2017-09-16 NOTE — Discharge Summary (Signed)
[facility] NAME: Victor Parks    MR#:  [id number]  DATE OF BIRTH:  [birth year]  DATE OF ADMISSION:  09/14/2017 ADMITTING PHYSICIAN: Idelle Crouch, MD  DATE OF DISCHARGE: 09/16/2017  PRIMARY CARE PHYSICIAN: Maryland Pink, MD    ADMISSION DIAGNOSIS:  Upper GI bleeding [K92.2] Non-intractable vomiting with nausea, unspecified vomiting type [R11.2]  DISCHARGE DIAGNOSIS:  Hematemesis and suspected due to known history of peptic ulcer disease/esophageal ulcers  SECONDARY DIAGNOSIS:   Past Medical History:  Diagnosis Date  . Bladder cancer ([place])   . Decubitus ulcer   . Hypertension   . Muscle weakness   . UTI (urinary tract infection)     HOSPITAL COURSE:  Victor Parks is a 84 y.o. male has a past medical history significant for HTN, dementia, and bladder cancer who resides at ALF brought to ER for hematemesis. Pt is confused and demented  *Upper GI bleed likely from esophagitis or gastric ulcers.  He has had EGD in the past.  Discussed with Dr. Vicente Males.  Suggested monitoring another 24 hours in the hospital. At patient's advanced age and with dementia would like to avoid EGD--this was discussed with patient's daughter Vaughan Basta over the phone.  She agrees with the plan. -No more hematemesis. -Received IV PPI--will change to oral PPI  *Acute blood loss anemia.  Hemoglobin dropped from 13-11.  Could also be hemodilution.   -  No need for transfusion at this time.  *Dementia.  Monitor for inpatient delirium. -At baseline  *DVT prophylaxis with TEDs  Physical therapy to see patient.  Will discharge patient back to [facility] assisted living. Arrange for home health PT if needed  Above was discussed with patient's daughter Vaughan Basta.  She is agreeable with the plan.  CONSULTS OBTAINED:  Treatment Team:  Jonathon Bellows, MD  DRUG ALLERGIES:   Allergies  Allergen Reactions  . Ciprofloxacin Anaphylaxis     DISCHARGE MEDICATIONS:   Allergies as of 09/16/2017      Reactions   Ciprofloxacin Anaphylaxis      Medication List    TAKE these medications   acetaminophen 500 MG tablet Commonly known as:  TYLENOL Take 1,000 mg by mouth 3 (three) times daily. 8 am, 2 pm, 8 pm   CALCIUM 1000 + D 1000-800 MG-UNIT Tabs Generic drug:  Calcium Carb-Cholecalciferol Take 1 tablet by mouth daily.   CALMOSEPTINE 0.44-20.6 % Oint Generic drug:  Menthol-Zinc Oxide Apply 1 application topically every 12 (twelve) hours as needed. To buttocks for rash   DAILY VITE PO Take 1 tablet by mouth daily.   docusate sodium 100 MG capsule Commonly known as:  COLACE Take 1 capsule (100 mg total) by mouth 2 (two) times daily.   ferrous sulfate 325 (65 FE) MG tablet Take 325 mg by mouth daily with breakfast.   HYDROcodone-acetaminophen 5-325 MG tablet Commonly known as:  NORCO/VICODIN Take 1 tablet by mouth every 6 (six) hours as needed for moderate pain.   pantoprazole 40 MG tablet Commonly known as:  PROTONIX Take 1 tablet (40 mg total) by mouth 2 (two) times daily.   potassium citrate 10 MEQ (1080 MG) SR tablet Commonly known as:  UROCIT-K Take 10 mEq by mouth 2 (two) times daily.   simethicone 80 MG chewable tablet Commonly known as:  MYLICON Chew 80 mg by mouth 4 (four) times daily.   traMADol 50 MG tablet Commonly known as:  ULTRAM Take 1 tablet (50 mg total) by  mouth 3 (three) times daily as needed. What changed:    when to take this  reasons to take this       If you experience worsening of your admission symptoms, develop shortness of breath, life threatening emergency, suicidal or homicidal thoughts you must seek medical attention immediately by calling 911 or calling your MD immediately  if symptoms less severe.  You Must read complete instructions/literature along with all the possible adverse reactions/side effects for all the Medicines you take and that have been prescribed to  you. Take any new Medicines after you have completely understood and accept all the possible adverse reactions/side effects.   Please note  You were cared for by a hospitalist during your hospital stay. If you have any questions about your discharge medications or the care you received while you were in the hospital after you are discharged, you can call the unit and asked to speak with the hospitalist on call if the hospitalist that took care of you is not available. Once you are discharged, your primary care physician will handle any further medical issues. Please note that NO REFILLS for any discharge medications will be authorized once you are discharged, as it is imperative that you return to your primary care physician (or establish a relationship with a primary care physician if you do not have one) for your aftercare needs so that they can reassess your need for medications and monitor your lab values. Today   SUBJECTIVE   No new complaints.  At baseline dementia. No hematemesis.  VITAL SIGNS:  Blood pressure (!) 110/55, pulse 64, temperature 98.1 F (36.7 C), resp. rate 18, height 5\' 6"  (1.676 m), weight 68.8 kg (151 lb 10.8 oz), SpO2 95 %.  I/O:    Intake/Output Summary (Last 24 hours) at 09/16/2017 0805 Last data filed at 09/16/2017 0623 Gross per 24 hour  Intake 3036.22 ml  Output 3050 ml  Net -13.78 ml    PHYSICAL EXAMINATION:  GENERAL:  84 y.o.-year-old patient lying in the bed with no acute distress.  EYES: Pupils equal, round, reactive to light and accommodation. No scleral icterus. Extraocular muscles intact.  HEENT: Head atraumatic, normocephalic. Oropharynx and nasopharynx clear.  NECK:  Supple, no jugular venous distention. No thyroid enlargement, no tenderness.  LUNGS: Normal breath sounds bilaterally, no wheezing, rales,rhonchi or crepitation. No use of accessory muscles of respiration.  CARDIOVASCULAR: S1, S2 normal. No murmurs, rubs, or gallops.  ABDOMEN: Soft,  non-tender, non-distended. Bowel sounds present. No organomegaly or mass.  EXTREMITIES: No pedal edema, cyanosis, or clubbing.  NEUROLOGIC: Cranial nerves II through XII are intact. Muscle strength 4/5 in all extremities. Sensation intact. Gait not checked.  PSYCHIATRIC: The patient is alert and awake SKIN: No obvious rash, lesion, or ulcer.   DATA REVIEW:   CBC  Recent Labs  Lab 09/15/17 0750  WBC 12.0*  HGB 11.0*  HCT 32.6*  PLT 169    Chemistries  Recent Labs  Lab 09/15/17 0750  NA 139  K 4.1  CL 106  CO2 26  GLUCOSE 107*  BUN 26*  CREATININE 1.01  CALCIUM 8.3*  AST 16  ALT 12*  ALKPHOS 68  BILITOT 0.7    Microbiology Results   Recent Results (from the past 240 hour(s))  MRSA PCR Screening     Status: None   Collection Time: 09/14/17  3:13 PM  Result Value Ref Range Status   MRSA by PCR NEGATIVE NEGATIVE Final    Comment:  The GeneXpert MRSA Assay (FDA approved for NASAL specimens only), is one component of a comprehensive MRSA colonization surveillance program. It is not intended to diagnose MRSA infection nor to guide or monitor treatment for MRSA infections. Performed at [facility], [address] [place], [place] [address]     RADIOLOGY:  Ct Abdomen Pelvis W Contrast  Result Date: 09/14/2017 CLINICAL DATA:  Vomiting blood. EXAM: CT ABDOMEN AND PELVIS WITH CONTRAST TECHNIQUE: Multidetector CT imaging of the abdomen and pelvis was performed using the standard protocol following bolus administration of intravenous contrast. CONTRAST:  175mL ISOVUE-300 IOPAMIDOL (ISOVUE-300) INJECTION 61% COMPARISON:  12/18/2016. FINDINGS: Lower chest: Lung bases are clear. Heart is at the upper limits of normal in size. Coronary artery calcification. No pericardial or pleural effusion. Distal esophagus is dilated and fluid-filled. Hepatobiliary: Liver and gallbladder are unremarkable. No biliary ductal dilatation. Pancreas: Negative. Spleen: Negative.  Adrenals/Urinary Tract: Adrenal glands are unremarkable. Low-attenuation lesions in the kidneys measure up to 1.4 cm on the right, similar and likely cysts. A stone is seen in the right kidney. Ureters are decompressed. Cystoprostatectomy with ileal conduit in the right lower quadrant. Stomach/Bowel: Stomach and proximal small bowel are unremarkable. Mildly dilated and fluid-filled distal small bowel postoperative changes in the distal small bowel. Small amount of dependent high attenuation in dilated distal small bowel (series 2, images 19-14), is of uncertain etiology. Colon is unremarkable. Vascular/Lymphatic: Atherosclerotic calcification of the arterial vasculature without abdominal aortic aneurysm. No pathologically enlarged lymph nodes. Surgical clips along both pelvic sidewalls. Reproductive: Prostatectomy. Other: No free fluid.  Mesenteries and peritoneum are unremarkable. Musculoskeletal: Postoperative changes in both proximal femurs. Osteopenia. Degenerative changes in the spine. No worrisome lytic or sclerotic lesions. L1 compression fracture is unchanged. IMPRESSION: 1. No definitive findings to explain the patient's symptoms. Small amount of dependent high attenuation within dilated distal small bowel is nonspecific. Difficult to exclude hemorrhage. 2. Mildly dilated and fluid-filled distal small bowel. Partial small bowel obstruction is not excluded. 3. Dilated and fluid-filled distal esophagus can be seen with reflux. 4. Aortic atherosclerosis (ICD10-170.0). Coronary artery calcification. Right renal stone. Electronically Signed   By: Lorin Picket M.D.   On: 09/14/2017 11:10     Management plans discussed with the patient, family and they are in agreement.  CODE STATUS:     Code Status Orders  (From admission, onward)        Start     Ordered   09/14/17 1507  Do not attempt resuscitation (DNR)  Continuous    Question Answer Comment  In the event of cardiac or respiratory ARREST Do  not call a "code blue"   In the event of cardiac or respiratory ARREST Do not perform Intubation, CPR, defibrillation or ACLS   In the event of cardiac or respiratory ARREST Use medication by any route, position, wound care, and other measures to relive pain and suffering. May use oxygen, suction and manual treatment of airway obstruction as needed for comfort.      09/14/17 1506    Code Status History    Date Active Date Inactive Code Status Order ID Comments User Context   07/07/2017 13:35 07/10/2017 13:09 DNR [id number]  Idelle Crouch, MD Inpatient   07/07/2017 13:29 07/07/2017 13:35 Full Code [id number]  Idelle Crouch, MD Inpatient   06/13/2016 05:31 06/22/2016 15:14 DNR [id number]  Saundra Shelling, MD Inpatient   05/29/2016 08:18 05/29/2016 17:01 DNR [id number]  Merlyn Lot, MD ED    Advance Directive Documentation  Most Recent Value  Type of Advance Directive  -- [not with him]  Pre-existing out of facility DNR order (yellow form or pink MOST form)  No data  "MOST" Form in Place?  No data      TOTAL TIME TAKING CARE OF THIS PATIENT: *40* minutes.    Fritzi Mandes M.D on 09/16/2017 at 8:05 AM  Between 7am to 6pm - Pager - [phone] After 6pm go to [email or web address] - password [email or web address] [facility]  Office  [phone]  CC: Primary care physician; Maryland Pink, MD

## 2017-09-16 NOTE — Care Management (Signed)
Spoke with Victor Parks at [facility]. States to just send orders for Home Health and Physical therapy and they will arrange services per [facility]. Will print orders and place in packet Victor Parks already has rolling walker at the facility. Shelbie Ammons RN MSN CCM Care Management [phone]

## 2017-09-16 NOTE — Clinical Social Work Note (Addendum)
Clinical Social Work Assessment  Patient Details  Name: Victor Parks MRN: [id number] Date of Birth: 1924-04-17  Date of referral:  09/16/17               Reason for consult:  Other (Comment Required)(From [facility] )                Permission sought to share information with:  Chartered certified accountant granted to share information::  Yes, Verbal Permission Granted  Name::      [facility]  Agency::     Relationship::     Contact Information:     Housing/Transportation Living arrangements for the past 2 months:  [place] of Information:  Patient, Facility Patient Interpreter Needed:  None Criminal Activity/Legal Involvement Pertinent to Current Situation/Hospitalization:  No - Comment as needed Significant Relationships:  Adult Children Lives with:  Spouse Do you feel safe going back to the place where you live?  Yes Need for family participation in patient care:  Yes (Comment)  Care giving concerns:  Patient is a resident at [facility] in [place], [place] (fax: [phone]).    Social Worker assessment / plan:  Holiday representative (CSW) reviewed chart and noted that patient is from [facility] and is stable for D/C today. CSW contacted Estate agent at [facility] and made her aware of above. Per Lattie Haw patient lives at [facility] with his wife, walks with a walker and is on room air at baseline. Per Lattie Haw patient can return to [facility] today and their transport employee Yvone Neu will pick patient up around 12 noon. PT is recommending home health. RN case manager arranged home health with Lattie Haw. CSW met with patient and made him aware of above. Patient was pleasantly confused but agreeable to D/C today. CSW attempted to contact patient's daughter Vaughan Basta however she did not answer and a voicemail was left. RN will call report and CSW sent D/C summary and FL2 to [facility]. Please reconsult if future social work needs arise.  CSW signing off.    Patient's daughter Vaughan Basta called CSW back and is in agreement with patient going back to [facility] today.   Employment status:  Retired, Disabled (Comment on whether or not currently receiving Disability) Insurance information:  Medicare PT Recommendations:  Home with [place] / Referral to community resources:  Other (Comment Required)(Patient will return to [facility] )  Patient/Family's Response to care:  Patient is agreeable to D/C back to [facility] today.   Patient/Family's Understanding of and Emotional Response to Diagnosis, Current Treatment, and Prognosis:  Patient was pleasantly confused. CSW left a voicemail with patient's daughter Vaughan Basta.   Emotional Assessment Appearance:  Appears stated age Attitude/Demeanor/Rapport:    Affect (typically observed):  Accepting, Quiet Orientation:  Oriented to Self, Fluctuating Orientation (Suspected and/or reported Sundowners), Oriented to Place Alcohol / Substance use:  Not Applicable Psych involvement (Current and /or in the community):  No (Comment)  Discharge Needs  Concerns to be addressed:  Discharge Planning Concerns Readmission within the last 30 days:  No Current discharge risk:  Chronically ill Barriers to Discharge:  No Barriers Identified   Tanya Marvin, Veronia Beets, LCSW 09/16/2017, 2:13 PM

## 2017-09-16 NOTE — Progress Notes (Signed)
Report called to [facility]. Madlyn Frankel, RN

## 2017-09-16 NOTE — Discharge Summary (Signed)
[facility] NAME: Victor Parks    MR#:  [id number]  DATE OF BIRTH:  11/12/1923  DATE OF ADMISSION:  09/14/2017 ADMITTING PHYSICIAN: Idelle Crouch, MD  DATE OF DISCHARGE: 09/16/2017  PRIMARY CARE PHYSICIAN: Maryland Pink, MD    ADMISSION DIAGNOSIS:  Upper GI bleeding [K92.2] Non-intractable vomiting with nausea, unspecified vomiting type [R11.2]  DISCHARGE DIAGNOSIS:  Hematemesis and suspected due to known history of peptic ulcer disease/esophageal ulcers  SECONDARY DIAGNOSIS:   Past Medical History:  Diagnosis Date  . Bladder cancer ([place])   . Decubitus ulcer   . Hypertension   . Muscle weakness   . UTI (urinary tract infection)     HOSPITAL COURSE:  Victor Parks is a 84 y.o. male has a past medical history significant for HTN, dementia, and bladder cancer who resides at ALF brought to ER for hematemesis. Pt is confused and demented  *Upper GI bleed likely from esophagitis or gastric ulcers.  He has had EGD in the past.  Discussed with Dr. Vicente Males.  Suggested monitoring another 24 hours in the hospital. At patient's advanced age and with dementia would like to avoid EGD--this was discussed with patient's daughter Vaughan Basta over the phone.  She agrees with the plan. -No more hematemesis. -Received IV PPI--will change to oral PPI  *Acute blood loss anemia.  Hemoglobin dropped from 13-11.  Could also be hemodilution.   -  No need for transfusion at this time.  *Dementia.  Monitor for inpatient delirium. -At baseline  *DVT prophylaxis with TEDs  Physical therapy recommends home health PT and RN.  This has been arranged. Above was discussed with patient's daughter Vaughan Basta.  She is agreeable with the plan.  CONSULTS OBTAINED:  Treatment Team:  Jonathon Bellows, MD  DRUG ALLERGIES:   Allergies  Allergen Reactions  . Ciprofloxacin Anaphylaxis    DISCHARGE MEDICATIONS:   Allergies as of 09/16/2017       Reactions   Ciprofloxacin Anaphylaxis      Medication List    TAKE these medications   acetaminophen 500 MG tablet Commonly known as:  TYLENOL Take 1,000 mg by mouth 3 (three) times daily. 8 am, 2 pm, 8 pm   CALCIUM 1000 + D 1000-800 MG-UNIT Tabs Generic drug:  Calcium Carb-Cholecalciferol Take 1 tablet by mouth daily.   CALMOSEPTINE 0.44-20.6 % Oint Generic drug:  Menthol-Zinc Oxide Apply 1 application topically every 12 (twelve) hours as needed. To buttocks for rash   DAILY VITE PO Take 1 tablet by mouth daily.   docusate sodium 100 MG capsule Commonly known as:  COLACE Take 1 capsule (100 mg total) by mouth 2 (two) times daily.   ferrous sulfate 325 (65 FE) MG tablet Take 325 mg by mouth daily with breakfast.   HYDROcodone-acetaminophen 5-325 MG tablet Commonly known as:  NORCO/VICODIN Take 1 tablet by mouth every 6 (six) hours as needed for moderate pain.   pantoprazole 40 MG tablet Commonly known as:  PROTONIX Take 1 tablet (40 mg total) by mouth 2 (two) times daily.   potassium citrate 10 MEQ (1080 MG) SR tablet Commonly known as:  UROCIT-K Take 10 mEq by mouth 2 (two) times daily.   simethicone 80 MG chewable tablet Commonly known as:  MYLICON Chew 80 mg by mouth 4 (four) times daily.   traMADol 50 MG tablet Commonly known as:  ULTRAM Take 1 tablet (50 mg total) by mouth 3 (three) times daily as needed. What changed:  when to take this  reasons to take this       If you experience worsening of your admission symptoms, develop shortness of breath, life threatening emergency, suicidal or homicidal thoughts you must seek medical attention immediately by calling 911 or calling your MD immediately  if symptoms less severe.  You Must read complete instructions/literature along with all the possible adverse reactions/side effects for all the Medicines you take and that have been prescribed to you. Take any new Medicines after you have completely understood  and accept all the possible adverse reactions/side effects.   Please note  You were cared for by a hospitalist during your hospital stay. If you have any questions about your discharge medications or the care you received while you were in the hospital after you are discharged, you can call the unit and asked to speak with the hospitalist on call if the hospitalist that took care of you is not available. Once you are discharged, your primary care physician will handle any further medical issues. Please note that NO REFILLS for any discharge medications will be authorized once you are discharged, as it is imperative that you return to your primary care physician (or establish a relationship with a primary care physician if you do not have one) for your aftercare needs so that they can reassess your need for medications and monitor your lab values. Today   SUBJECTIVE   No new complaints.  At baseline dementia. No hematemesis.  VITAL SIGNS:  Blood pressure (!) 110/55, pulse 64, temperature 98.1 F (36.7 C), resp. rate 18, height 5\' 6"  (1.676 m), weight 68.8 kg (151 lb 10.8 oz), SpO2 95 %.  I/O:    Intake/Output Summary (Last 24 hours) at 09/16/2017 1043 Last data filed at 09/16/2017 0900 Gross per 24 hour  Intake 840 ml  Output 2925 ml  Net -2085 ml    PHYSICAL EXAMINATION:  GENERAL:  84 y.o.-year-old patient lying in the bed with no acute distress.  EYES: Pupils equal, round, reactive to light and accommodation. No scleral icterus. Extraocular muscles intact.  HEENT: Head atraumatic, normocephalic. Oropharynx and nasopharynx clear.  NECK:  Supple, no jugular venous distention. No thyroid enlargement, no tenderness.  LUNGS: Normal breath sounds bilaterally, no wheezing, rales,rhonchi or crepitation. No use of accessory muscles of respiration.  CARDIOVASCULAR: S1, S2 normal. No murmurs, rubs, or gallops.  ABDOMEN: Soft, non-tender, non-distended. Bowel sounds present. No organomegaly or  mass.  EXTREMITIES: No pedal edema, cyanosis, or clubbing.  NEUROLOGIC: Cranial nerves II through XII are intact. Muscle strength 4/5 in all extremities. Sensation intact. Gait not checked.  PSYCHIATRIC: The patient is alert and awake SKIN: No obvious rash, lesion, or ulcer.   DATA REVIEW:   CBC  Recent Labs  Lab 09/15/17 0750  WBC 12.0*  HGB 11.0*  HCT 32.6*  PLT 169    Chemistries  Recent Labs  Lab 09/15/17 0750  NA 139  K 4.1  CL 106  CO2 26  GLUCOSE 107*  BUN 26*  CREATININE 1.01  CALCIUM 8.3*  AST 16  ALT 12*  ALKPHOS 68  BILITOT 0.7    Microbiology Results   Recent Results (from the past 240 hour(s))  MRSA PCR Screening     Status: None   Collection Time: 09/14/17  3:13 PM  Result Value Ref Range Status   MRSA by PCR NEGATIVE NEGATIVE Final    Comment:        The GeneXpert MRSA Assay (FDA approved for NASAL  specimens only), is one component of a comprehensive MRSA colonization surveillance program. It is not intended to diagnose MRSA infection nor to guide or monitor treatment for MRSA infections. Performed at [facility], [address], [place], [place] [address]     RADIOLOGY:  Ct Abdomen Pelvis W Contrast  Result Date: 09/14/2017 CLINICAL DATA:  Vomiting blood. EXAM: CT ABDOMEN AND PELVIS WITH CONTRAST TECHNIQUE: Multidetector CT imaging of the abdomen and pelvis was performed using the standard protocol following bolus administration of intravenous contrast. CONTRAST:  159mL ISOVUE-300 IOPAMIDOL (ISOVUE-300) INJECTION 61% COMPARISON:  12/18/2016. FINDINGS: Lower chest: Lung bases are clear. Heart is at the upper limits of normal in size. Coronary artery calcification. No pericardial or pleural effusion. Distal esophagus is dilated and fluid-filled. Hepatobiliary: Liver and gallbladder are unremarkable. No biliary ductal dilatation. Pancreas: Negative. Spleen: Negative. Adrenals/Urinary Tract: Adrenal glands are unremarkable.  Low-attenuation lesions in the kidneys measure up to 1.4 cm on the right, similar and likely cysts. A stone is seen in the right kidney. Ureters are decompressed. Cystoprostatectomy with ileal conduit in the right lower quadrant. Stomach/Bowel: Stomach and proximal small bowel are unremarkable. Mildly dilated and fluid-filled distal small bowel postoperative changes in the distal small bowel. Small amount of dependent high attenuation in dilated distal small bowel (series 2, images 96-78), is of uncertain etiology. Colon is unremarkable. Vascular/Lymphatic: Atherosclerotic calcification of the arterial vasculature without abdominal aortic aneurysm. No pathologically enlarged lymph nodes. Surgical clips along both pelvic sidewalls. Reproductive: Prostatectomy. Other: No free fluid.  Mesenteries and peritoneum are unremarkable. Musculoskeletal: Postoperative changes in both proximal femurs. Osteopenia. Degenerative changes in the spine. No worrisome lytic or sclerotic lesions. L1 compression fracture is unchanged. IMPRESSION: 1. No definitive findings to explain the patient's symptoms. Small amount of dependent high attenuation within dilated distal small bowel is nonspecific. Difficult to exclude hemorrhage. 2. Mildly dilated and fluid-filled distal small bowel. Partial small bowel obstruction is not excluded. 3. Dilated and fluid-filled distal esophagus can be seen with reflux. 4. Aortic atherosclerosis (ICD10-170.0). Coronary artery calcification. Right renal stone. Electronically Signed   By: Lorin Picket M.D.   On: 09/14/2017 11:10     Management plans discussed with the patient, family and they are in agreement.  CODE STATUS:     Code Status Orders  (From admission, onward)        Start     Ordered   09/14/17 1507  Do not attempt resuscitation (DNR)  Continuous    Question Answer Comment  In the event of cardiac or respiratory ARREST Do not call a "code blue"   In the event of cardiac or  respiratory ARREST Do not perform Intubation, CPR, defibrillation or ACLS   In the event of cardiac or respiratory ARREST Use medication by any route, position, wound care, and other measures to relive pain and suffering. May use oxygen, suction and manual treatment of airway obstruction as needed for comfort.      09/14/17 1506    Code Status History    Date Active Date Inactive Code Status Order ID Comments User Context   07/07/2017 13:35 07/10/2017 13:09 DNR [id number]  Idelle Crouch, MD Inpatient   07/07/2017 13:29 07/07/2017 13:35 Full Code [id number]  Idelle Crouch, MD Inpatient   06/13/2016 05:31 06/22/2016 15:14 DNR [id number]  Saundra Shelling, MD Inpatient   05/29/2016 08:18 05/29/2016 17:01 DNR [id number]  Merlyn Lot, MD ED    Advance Directive Documentation     Most Recent Value  Type  of Advance Directive  -- [not with him]  Pre-existing out of facility DNR order (yellow form or pink MOST form)  No data  "MOST" Form in Place?  No data      TOTAL TIME TAKING CARE OF THIS PATIENT: *40* minutes.    Fritzi Mandes M.D on 09/16/2017 at 10:43 AM  Between 7am to 6pm - Pager - [phone] After 6pm go to [email or web address] - password [email or web address] [facility]  Office  [phone]  CC: Primary care physician; Maryland Pink, MD

## 2017-09-16 NOTE — Evaluation (Signed)
Physical Therapy Evaluation Patient Details Name: Victor Parks MRN: [id number] DOB: [birth year] Today's Date: 09/16/2017   History of Present Illness  Pt is a 84 y/o F who presented with hematemesis and confusion.  PT admitted with upper GI bleed likely from esophagitis or gastric ulcers.  Pt's PMH includes bladder cancer, L femur IM nail, urostomy.      Clinical Impression  Pt admitted with above diagnosis. Pt currently with functional limitations due to the deficits listed below (see PT Problem List). [name] presents from ALF.  Unclear of pt's PLOF as no family present and pt with dementia at baseline. The pt currently requires mod assist for sit>stand transfers and min guard>min assist to ambulate 65 ft with RW.  If staff at ALF is able to provide this level of assist for the pt at d/c then recommendation is to return to ALF.  However, if this amount of assist is not available, then recommendation would be for SNF.  Pt will benefit from skilled PT to increase their independence and safety with mobility to allow discharge to the venue listed below.      Follow Up Recommendations Other (comment);Home health PT(return to ALF if staff can provide physical assist required)    Equipment Recommendations  Rolling walker with 5" wheels(if pt does not already have one)    Recommendations for Other Services       Precautions / Restrictions Precautions Precautions: Fall Precaution Comments: urostomy RLQ Restrictions Weight Bearing Restrictions: No      Mobility  Bed Mobility Overal bed mobility: Needs Assistance Bed Mobility: Supine to Sit     Supine to sit: Min guard;HOB elevated     General bed mobility comments: Max increased time and effort with cues to scoot EOB once sitting EOB to prepare for standing.   Transfers Overall transfer level: Needs assistance Equipment used: Rolling walker (2 wheeled) Transfers: Sit to/from Stand Sit to Stand: From elevated surface;Mod  assist         General transfer comment: Pt requires repetitive verbal cues and hand over hand assist for proper hand placement.  Cues to lean anteriorly with trunk and assist provided to boost to standing as pt demonstrates a posterior lean.  Bed elevated as pt unable to perform sit>stand from regular height bed.   Ambulation/Gait Ambulation/Gait assistance: Min assist;Min guard Ambulation Distance (Feet): 65 Feet Assistive device: Rolling walker (2 wheeled) Gait Pattern/deviations: Step-through pattern;Decreased stride length;Trunk flexed   Gait velocity interpretation: at or above normal speed for age/gender General Gait Details: Pt with significantly flexed posture which improved minimally and temporarily with verbal cues for upright posture and forward gaze.  Pt initially requires min assist to maintain upright at start of ambulation but transitions to min guard assist for remainder of ambulation.   Stairs            Wheelchair Mobility    Modified Rankin (Stroke Patients Only)       Balance Overall balance assessment: Needs assistance Sitting-balance support: No upper extremity supported;Feet supported Sitting balance-Leahy Scale: Fair     Standing balance support: Bilateral upper extremity supported;During functional activity Standing balance-Leahy Scale: Poor Standing balance comment: Pt relies on UE support for static and dynamic activities                             Pertinent Vitals/Pain Pain Assessment: No/denies pain    Home Living Family/patient expects to be discharged to:: Assisted living  Additional Comments: Per chart pt is a resident at [facility].  Pt reports he sometimes uses RW but unable to provide information on home layout, PLOF, or equipment.     Prior Function Level of Independence: Needs assistance   Gait / Transfers Assistance Needed: Pt initially says that he doesn't use a RW at baseline; however,  later in the session he reports he does use a walker occasionally.   ADL's / Homemaking Assistance Needed: Pt says, "I don't need any help because I'm a grown man".  However, suspect that pt requires some assist with ADLs.         Hand Dominance        Extremity/Trunk Assessment   Upper Extremity Assessment Upper Extremity Assessment: (BUE strength grossly 4-/5)    Lower Extremity Assessment Lower Extremity Assessment: (BLE strength grossly 4-/5)    Cervical / Trunk Assessment Cervical / Trunk Assessment: Kyphotic  Communication   Communication: HOH  Cognition Arousal/Alertness: Awake/alert Behavior During Therapy: WFL for tasks assessed/performed Overall Cognitive Status: History of cognitive impairments - at baseline                                        General Comments      Exercises     Assessment/Plan    PT Assessment Patient needs continued PT services  PT Problem List Decreased strength;Decreased balance;Decreased cognition;Decreased knowledge of use of DME;Decreased safety awareness       PT Treatment Interventions DME instruction;Gait training;Functional mobility training;Therapeutic activities;Therapeutic exercise;Balance training;Neuromuscular re-education;Cognitive remediation;Patient/family education;Wheelchair mobility training    PT Goals (Current goals can be found in the Care Plan section)  Acute Rehab PT Goals Patient Stated Goal: to go home PT Goal Formulation: With patient Time For Goal Achievement: 09/30/17 Potential to Achieve Goals: Good    Frequency Min 2X/week   Barriers to discharge Other (comment) Unsure of the amount of assist available at d/c    Co-evaluation               AM-PAC PT "6 Clicks" Daily Activity  Outcome Measure Difficulty turning over in bed (including adjusting bedclothes, sheets and blankets)?: A Lot Difficulty moving from lying on back to sitting on the side of the bed? : A  Lot Difficulty sitting down on and standing up from a chair with arms (e.g., wheelchair, bedside commode, etc,.)?: Unable Help needed moving to and from a bed to chair (including a wheelchair)?: A Little Help needed walking in hospital room?: A Little Help needed climbing 3-5 steps with a railing? : A Lot 6 Click Score: 13    End of Session Equipment Utilized During Treatment: Gait belt Activity Tolerance: Patient tolerated treatment well Patient left: in chair;with call bell/phone within reach;with chair alarm set Nurse Communication: Mobility status PT Visit Diagnosis: Unsteadiness on feet (R26.81);Other abnormalities of gait and mobility (R26.89);Muscle weakness (generalized) (M62.81);Difficulty in walking, not elsewhere classified (R26.2)    Time: 0093-8182 PT Time Calculation (min) (ACUTE ONLY): 25 min   Charges:   PT Evaluation $PT Eval Low Complexity: 1 Low PT Treatments $Gait Training: 8-22 mins   PT G Codes:        Collie Siad PT, DPT 09/16/2017, 10:14 AM

## 2017-09-16 NOTE — NC FL2 (Signed)
[place] LEVEL OF CARE SCREENING TOOL     IDENTIFICATION  Patient Name: Victor Parks Birthdate: 17-Oct-1923 Sex: male Admission Date (Current Location): 09/14/2017  [facility] and [place] Number:  Engineering geologist and Address:  [facility], [address], [place], [place] [address]      Provider Number: [id number]  Attending Physician Name and Address:  Fritzi Mandes, MD  Relative Name and Phone Number:       Current Level of Care: Hospital Recommended Level of Care: [place] Prior Approval Number:    Date Approved/Denied:   PASRR Number:    Discharge Plan: Domiciliary (Rest home)    Current Diagnoses: Patient Active Problem List   Diagnosis Date Noted  . GI bleed 09/14/2017  . HTN (hypertension) 09/14/2017  . Closed left hip fracture ([place]) 07/07/2017  . Hypotension 07/07/2017  . 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 Closed displaced intertrochanteric fracture of left femur ([place])   . Severe sepsis with septic shock ([place]) 06/17/2016  . Acute blood loss anemia 06/17/2016  . Septic shock ([place]) 06/16/2016  . Protein-calorie malnutrition, severe 06/15/2016  . Abdominal pain   . Dyspnea 06/13/2016  . Pressure injury of skin 06/13/2016    Orientation RESPIRATION BLADDER Height & Weight     Self  Normal Continent Weight: 151 lb 10.8 oz (68.8 kg) Height:  5\' 6"  (167.6 cm)  BEHAVIORAL SYMPTOMS/MOOD NEUROLOGICAL BOWEL NUTRITION STATUS      Incontinent Diet(Diet: Low Sodium/ Heart Healthy )  AMBULATORY STATUS COMMUNICATION OF NEEDS Skin   Limited Assist Verbally Normal                       Personal Care Assistance Level of Assistance  Bathing, Feeding, Dressing Bathing Assistance: Limited assistance Feeding assistance: Independent Dressing Assistance: Limited assistance     Functional Limitations Info  Sight, Hearing, Speech Sight Info: Adequate Hearing Info: Adequate Speech Info: Adequate    SPECIAL  CARE FACTORS FREQUENCY  PT (By licensed PT)     PT Frequency: (2-3 home health. )              Contractures      Additional Factors Info  Code Status, Allergies Code Status Info: (DNR ) Allergies Info: (Ciprofloxacin)          Discharge Medications: Please see discharge summary for a list of discharge medications. Medication List     TAKE these medications   acetaminophen 500 MG tablet Commonly known as:  TYLENOL Take 1,000 mg by mouth 3 (three) times daily. 8 am, 2 pm, 8 pm   CALCIUM 1000 + D 1000-800 MG-UNIT Tabs Generic drug:  Calcium Carb-Cholecalciferol Take 1 tablet by mouth daily.   CALMOSEPTINE 0.44-20.6 % Oint Generic drug:  Menthol-Zinc Oxide Apply 1 application topically every 12 (twelve) hours as needed. To buttocks for rash   DAILY VITE PO Take 1 tablet by mouth daily.   docusate sodium 100 MG capsule Commonly known as:  COLACE Take 1 capsule (100 mg total) by mouth 2 (two) times daily.   ferrous sulfate 325 (65 FE) MG tablet Take 325 mg by mouth daily with breakfast.   HYDROcodone-acetaminophen 5-325 MG tablet Commonly known as:  NORCO/VICODIN Take 1 tablet by mouth every 6 (six) hours as needed for moderate pain.   pantoprazole 40 MG tablet Commonly known as:  PROTONIX Take 1 tablet (40 mg total) by mouth 2 (two) times daily.   potassium citrate 10 MEQ (1080  MG) SR tablet Commonly known as:  UROCIT-K Take 10 mEq by mouth 2 (two) times daily.   simethicone 80 MG chewable tablet Commonly known as:  MYLICON Chew 80 mg by mouth 4 (four) times daily.   traMADol 50 MG tablet Commonly known as:  ULTRAM Take 1 tablet (50 mg total) by mouth 3 (three) times daily as needed. What changed:    when to take this  reasons to take this   Relevant Imaging Results: Relevant Lab Results: Additional Information (SSN: [id number])  Victor Parks, Victor Beets, LCSW

## 2017-09-16 NOTE — Progress Notes (Signed)
Pt is being discharged today, Victor Parks will arrange transportation for 12 noon today. Patient is dressed, IV removed, all belongings packed.

## 2017-09-20 ENCOUNTER — Emergency Department
Admission: EM | Admit: 2017-09-20 | Discharge: 2017-09-20 | Disposition: A | Discharge status: Home / Self Care | Payer: Medicare Other | Attending: Emergency Medicine | Admitting: Emergency Medicine

## 2017-09-20 ENCOUNTER — Emergency Department: Payer: Medicare Other

## 2017-09-20 ENCOUNTER — Other Ambulatory Visit: Payer: Self-pay

## 2017-09-20 DIAGNOSIS — S51011A Laceration without foreign body of right elbow, initial encounter: Secondary | ICD-10-CM | POA: Diagnosis not present

## 2017-09-20 DIAGNOSIS — Z79899 Other long term (current) drug therapy: Secondary | ICD-10-CM | POA: Diagnosis not present

## 2017-09-20 DIAGNOSIS — Z87891 Personal history of nicotine dependence: Secondary | ICD-10-CM | POA: Diagnosis not present

## 2017-09-20 DIAGNOSIS — F0390 Unspecified dementia without behavioral disturbance: Secondary | ICD-10-CM | POA: Insufficient documentation

## 2017-09-20 DIAGNOSIS — Y998 Other external cause status: Secondary | ICD-10-CM | POA: Diagnosis not present

## 2017-09-20 DIAGNOSIS — Z8052 Family history of malignant neoplasm of bladder: Secondary | ICD-10-CM | POA: Diagnosis not present

## 2017-09-20 DIAGNOSIS — Y939 Activity, unspecified: Secondary | ICD-10-CM | POA: Insufficient documentation

## 2017-09-20 DIAGNOSIS — I10 Essential (primary) hypertension: Secondary | ICD-10-CM | POA: Diagnosis not present

## 2017-09-20 DIAGNOSIS — S59901A Unspecified injury of right elbow, initial encounter: Secondary | ICD-10-CM | POA: Diagnosis present

## 2017-09-20 DIAGNOSIS — S61512A Laceration without foreign body of left wrist, initial encounter: Secondary | ICD-10-CM | POA: Diagnosis not present

## 2017-09-20 DIAGNOSIS — W19XXXA Unspecified fall, initial encounter: Secondary | ICD-10-CM | POA: Diagnosis not present

## 2017-09-20 DIAGNOSIS — Y92121 Bathroom in nursing home as the place of occurrence of the external cause: Secondary | ICD-10-CM | POA: Insufficient documentation

## 2017-09-20 LAB — BASIC METABOLIC PANEL
Anion gap: 9 (ref 5–15)
BUN: 19 mg/dL (ref 6–20)
CO2: 27 mmol/L (ref 22–32)
CREATININE: 1 mg/dL (ref 0.61–1.24)
Calcium: 9 mg/dL (ref 8.9–10.3)
Chloride: 102 mmol/L (ref 101–111)
GFR calc Af Amer: >60 mL/min (ref >60)
Glucose, Bld: 109 mg/dL — ABNORMAL HIGH (ref 65–99)
POTASSIUM: 4.5 mmol/L (ref 3.5–5.1)
SODIUM: 138 mmol/L (ref 135–145)

## 2017-09-20 LAB — CBC
HCT: 37.7 % — ABNORMAL LOW (ref 40.0–52.0)
Hemoglobin: 12.6 g/dL — ABNORMAL LOW (ref 13.0–18.0)
MCH: 31 pg (ref 26.0–34.0)
MCHC: 33.4 g/dL (ref 32.0–36.0)
MCV: 92.9 fL (ref 80.0–100.0)
PLATELETS: 253 10*3/uL (ref 150–440)
RBC: 4.06 MIL/uL — AB (ref 4.40–5.90)
RDW: 14.2 % (ref 11.5–14.5)
WBC: 10.7 10*3/uL — ABNORMAL HIGH (ref 3.8–10.6)

## 2017-09-20 NOTE — Discharge Instructions (Signed)

## 2017-09-20 NOTE — ED Triage Notes (Signed)
Pt arrives via ems from [facility], pt fell in the bathroom according to his wife, she heard him make a noise in the bathroom and found him in the floor, pt has skin tears to his rt arm and left wrist. Pt denies pain at this time

## 2017-09-20 NOTE — ED Notes (Signed)
Pt presents post fall from [facility]. Pt does not remember falling, nor does he know that he fell. He does not know where he is or why he is here. Denies pain. NAD Noted.

## 2017-09-20 NOTE — ED Notes (Signed)
Ems  Called  To  Transport  Pt  To  [facility]

## 2017-09-20 NOTE — ED Notes (Signed)
Pt discharged home; papers sent with ems for wife. NAD Noted.

## 2017-09-20 NOTE — ED Provider Notes (Signed)
[facility] Emergency Department Provider Note   ____________________________________________   First MD Initiated Contact with Patient 09/20/17 1316     (approximate)  I have reviewed the triage vital signs and the nursing notes.   HISTORY  Chief Complaint Fall  EM caveat: Dementia, poor historian with unwitnessed fall  HPI Victor Parks is a 84 y.o. male a history of bladder cancer, recent observation for upper GI bleed started on PPI, dementia, and cognitive decline.  Patient presents today for evaluation after a fall.  EMS reports patient lives at [facility], he had a fall in the bathroom according to family.  It was not witnessed.  They did note a skin tear over his right elbow as well as left wrist.  Patient himself is a poor historian, does not have any recollection of a particular fall or injury.  The patient reports reports no concerns at present.  Denies headache.  Denies neck pain.  No chest pain or trouble breathing.     Past Medical History:  Diagnosis Date  . Bladder cancer ([place])   . Decubitus ulcer   . Hypertension   . Muscle weakness   . UTI (urinary tract infection)     Patient Active Problem List   Diagnosis Date Noted  . GI bleed 09/14/2017  . HTN (hypertension) 09/14/2017  . Closed left hip fracture ([place]) 07/07/2017  . Hypotension 07/07/2017  . 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 Closed displaced intertrochanteric fracture of left femur ([place])   . Severe sepsis with septic shock ([place]) 06/17/2016  . Acute blood loss anemia 06/17/2016  . Septic shock ([place]) 06/16/2016  . Protein-calorie malnutrition, severe 06/15/2016  . Abdominal pain   . Dyspnea 06/13/2016  . Pressure injury of skin 06/13/2016    Past Surgical History:  Procedure Laterality Date  . ESOPHAGOGASTRODUODENOSCOPY (EGD) WITH PROPOFOL N/A 06/20/2016   Procedure: ESOPHAGOGASTRODUODENOSCOPY (EGD) WITH PROPOFOL;  Surgeon: Jonathon Bellows, MD;  Location: [facility]  ENDOSCOPY;  Service: Endoscopy;  Laterality: N/A;  . FEMUR IM NAIL Left 07/07/2017   Procedure: INTRAMEDULLARY (IM) NAIL FEMORAL;  Surgeon: Stark Bray, MD;  Location: [facility] ORS;  Service: Orthopedics;  Laterality: Left;  . ILEAL POUCH     s/p bladder cancer  . none      Prior to Admission medications   Medication Sig Start Date End Date Taking? Authorizing Provider  acetaminophen (TYLENOL) 500 MG tablet Take 1,000 mg by mouth 3 (three) times daily. 8 am, 2 pm, 8 pm   Yes [provider]  Calcium Carb-Cholecalciferol (CALCIUM 1000 + D) 1000-800 MG-UNIT TABS Take 1 tablet by mouth daily.   Yes [provider]  Dextromethorphan-guaiFENesin (TUSSIN DM) 10-100 MG/5ML liquid Take 10 mLs by mouth 4 (four) times daily.   Yes [provider]  docusate sodium (COLACE) 100 MG capsule Take 1 capsule (100 mg total) by mouth 2 (two) times daily. 07/10/17  Yes Wieting, Richard, MD  ferrous sulfate 325 (65 FE) MG tablet Take 325 mg by mouth daily with breakfast.   Yes [provider]  HYDROcodone-acetaminophen (NORCO/VICODIN) 5-325 MG tablet Take 1 tablet by mouth every 6 (six) hours as needed for moderate pain. 07/10/17  Yes Wieting, Richard, MD  Menthol-Zinc Oxide (CALMOSEPTINE) 0.44-20.6 % OINT Apply 1 application topically every 12 (twelve) hours as needed. To buttocks for rash   Yes [provider]  Multiple Vitamin (DAILY VITE PO) Take 1 tablet by mouth daily.   Yes [provider]  pantoprazole (PROTONIX) 40 MG  tablet Take 1 tablet (40 mg total) by mouth 2 (two) times daily. 07/10/17  Yes Wieting, Richard, MD  potassium citrate (UROCIT-K) 10 MEQ (1080 MG) SR tablet Take 10 mEq by mouth 2 (two) times daily.   Yes [provider]  simethicone (MYLICON) 80 MG chewable tablet Chew 80 mg by mouth 4 (four) times daily.    Yes [provider]  traMADol (ULTRAM) 50 MG tablet Take 1 tablet (50 mg total) by mouth 3 (three) times daily as  needed. 09/16/17  Yes Fritzi Mandes, MD    Allergies Ciprofloxacin  No family history on file.  Social History Social History   Tobacco Use  . Smoking status: Former Research scientist (life sciences)  . Smokeless tobacco: Never Used  Substance Use Topics  . Alcohol use: No  . Drug use: No    Review of Systems - reviewed with patient. Constitutional: No fever/chills Eyes: No visual changes. ENT: No sore throat. Cardiovascular: Denies chest pain. Respiratory: Denies shortness of breath. Gastrointestinal: No abdominal pain.  No nausea, no vomiting.  No diarrhea.  No constipation. Genitourinary: Negative for dysuria. Musculoskeletal: Negative for back pain. Skin: Negative for rash. Neurological: Negative for headaches, focal weakness or numbness.    ____________________________________________   PHYSICAL EXAM:  VITAL SIGNS: ED Triage Vitals  Enc Vitals Group     BP 09/20/17 1258 123/66     Pulse Rate 09/20/17 1258 61     Resp 09/20/17 1258 18     Temp 09/20/17 1258 97.6 F (36.4 C)     Temp Source 09/20/17 1258 Oral     SpO2 09/20/17 1258 98 %     Weight 09/20/17 1334 151 lb (68.5 kg)     Height 09/20/17 1334 5\' 6"  (1.676 m)     Head Circumference --      Peak Flow --      Pain Score --      Pain Loc --      Pain Edu? --      Excl. in [place]? --     Constitutional: Alert and oriented to self but not place. Well appearing and in no acute distress. Eyes: Conjunctivae are normal. Head: Atraumatic. Nose: No congestion/rhinnorhea. Mouth/Throat: Mucous membranes are moist. Neck: No stridor.   Cardiovascular: Normal rate, regular rhythm. Grossly normal heart sounds.  Good peripheral circulation. Respiratory: Normal respiratory effort.  No retractions. Lungs CTAB. Gastrointestinal: Soft and nontender. No distention. Musculoskeletal:   RIGHT Right upper extremity demonstrates normal strength, good use of all muscles. No edema bruising or contusions of the right shoulder/upper arm, right elbow,  right forearm / hand. Full range of motion of the right right upper extremity without pain. No evidence of trauma. Strong radial pulse. Intact median/ulnar/radial neuro-muscular exam.  LEFT Left upper extremity demonstrates normal strength, good use of all muscles. No edema bruising or contusions of the left shoulder/upper arm, left elbow, left forearm / hand. Full range of motion of the left  upper extremity without pain. No evidence of trauma. Strong radial pulse. Intact median/ulnar/radial neuro-muscular exam.  Lower Extremities  No edema. Normal DP/PT pulses bilateral with good cap refill.  Normal neuro-motor function lower extremities bilateral.  RIGHT Right lower extremity demonstrates normal strength, good use of all muscles. No edema bruising or contusions of the right hip, right knee, right ankle. Full range of motion of the right lower extremity without pain. No pain on axial loading. No evidence of trauma except he does have a approximately right posterior elbow without evidence  of complication or deeper injury.  LEFT Left lower extremity demonstrates normal strength, good use of all muscles. No edema bruising or contusions of the hip,  knee, ankle. Full range of motion of the left lower extremity without pain. No pain on axial loading. No evidence of trauma.   Neurologic:  Normal speech and language. No gross focal neurologic deficits are appreciated.  Skin:  Skin is warm, dry and intact. No rash noted. Psychiatric: Mood and affect are normal. Speech and behavior are normal.  ____________________________________________   LABS (all labs ordered are listed, but only abnormal results are displayed)  Labs Reviewed  CBC  BASIC METABOLIC PANEL   ____________________________________________  EKG  Reviewed and are by me at 1602 Ventricular rate 65 QRS 120 QTC 470 Normal sinus rhythm, right bundle branch block without evidence of  ischemia. ____________________________________________  RADIOLOGY   Right elbow x-ray reviewed by me, negative for acute.  CT of the head, negative for acute. ____________________________________________   PROCEDURES  Procedure(s) performed: None  Procedures  Critical Care performed: No  ____________________________________________   INITIAL IMPRESSION / ASSESSMENT AND PLAN / ED COURSE  Pertinent labs & imaging results that were available during my care of the patient were reviewed by me and considered in my medical decision making (see chart for details).  Patient resents for evaluation of an unwitnessed fall.  Denies injury, however does have a recent history of upper GI bleed.  He is hemodynamically stable in no distress.  A small skin tear is denoted, but otherwise reassuring exam.  No neck pain, low risk mechanism for head or neck injury, but given his unwitnessed status will obtain a CT of the head to evaluate for intracranial trauma though my pretest probability is low.  Patient denying any cardiac or pulmonary symptoms.  No abdominal symptoms.  Does have a notable history of dementia, appears to be at his baseline with regard to neurologic function.  Clinical Course as of Sep 20 1510  Fri Sep 20, 2017  1511 Message (no demographics) left on listed number for daughter Vaughan Basta. No answer. Await call back.   [MQ]    Clinical Course User Index [MQ] Delman Kitten, MD   ----------------------------------------- 3:18 PM on 09/20/2017 -----------------------------------------  Patient's daughter return call, reports that he did have an unwitnessed fall today at [facility].  Discussed his x-rays, no acute injury noted in his skin tear.  I did also discuss we would like to obtain a blood count given his recent stomach ulcer and make sure that that is stable before return home.  Patient's daughter, Vaughan Basta is a in agreement with plan to return him to [facility] if blood count reassuring  on recheck today.  ----------------------------------------- 3:51 PM on 09/20/2017 -----------------------------------------  Patient resting comfortably in the hallway.  No evidence of injury.  Patient will be discharged back to [facility] at this time.  Resting comfortably no distress. ____________________________________________   FINAL CLINICAL IMPRESSION(S) / ED DIAGNOSES  Final diagnoses:  Fall, initial encounter  Skin tear of right elbow without complication, initial encounter      NEW MEDICATIONS STARTED DURING THIS VISIT:  New Prescriptions   No medications on file     Note:  This document was prepared using Dragon voice recognition software and may include unintentional dictation errors.     Delman Kitten, MD 09/20/17 [phone]

## 2017-10-09 ENCOUNTER — Other Ambulatory Visit: Payer: Self-pay

## 2017-10-09 ENCOUNTER — Inpatient Hospital Stay
Admission: EM | Admit: 2017-10-09 | Discharge: 2017-10-11 | DRG: 378 | Disposition: A | Discharge status: Skilled Nursing Facility | Payer: Medicare Other | Attending: Internal Medicine | Admitting: Internal Medicine

## 2017-10-09 ENCOUNTER — Encounter: Payer: Self-pay | Admitting: Emergency Medicine

## 2017-10-09 DIAGNOSIS — R319 Hematuria, unspecified: Secondary | ICD-10-CM | POA: Diagnosis present

## 2017-10-09 DIAGNOSIS — E860 Dehydration: Secondary | ICD-10-CM | POA: Diagnosis present

## 2017-10-09 DIAGNOSIS — Z936 Other artificial openings of urinary tract status: Secondary | ICD-10-CM | POA: Diagnosis not present

## 2017-10-09 DIAGNOSIS — Z79891 Long term (current) use of opiate analgesic: Secondary | ICD-10-CM | POA: Diagnosis not present

## 2017-10-09 DIAGNOSIS — Z881 Allergy status to other antibiotic agents status: Secondary | ICD-10-CM

## 2017-10-09 DIAGNOSIS — Z87440 Personal history of urinary (tract) infections: Secondary | ICD-10-CM | POA: Diagnosis not present

## 2017-10-09 DIAGNOSIS — F0390 Unspecified dementia without behavioral disturbance: Secondary | ICD-10-CM | POA: Diagnosis present

## 2017-10-09 DIAGNOSIS — K920 Hematemesis: Secondary | ICD-10-CM | POA: Diagnosis present

## 2017-10-09 DIAGNOSIS — Z87891 Personal history of nicotine dependence: Secondary | ICD-10-CM

## 2017-10-09 DIAGNOSIS — N390 Urinary tract infection, site not specified: Secondary | ICD-10-CM | POA: Diagnosis present

## 2017-10-09 DIAGNOSIS — Z66 Do not resuscitate: Secondary | ICD-10-CM | POA: Diagnosis present

## 2017-10-09 DIAGNOSIS — K922 Gastrointestinal hemorrhage, unspecified: Principal | ICD-10-CM | POA: Diagnosis present

## 2017-10-09 DIAGNOSIS — Z8711 Personal history of peptic ulcer disease: Secondary | ICD-10-CM | POA: Diagnosis not present

## 2017-10-09 DIAGNOSIS — Z79899 Other long term (current) drug therapy: Secondary | ICD-10-CM | POA: Diagnosis not present

## 2017-10-09 DIAGNOSIS — D649 Anemia, unspecified: Secondary | ICD-10-CM | POA: Diagnosis present

## 2017-10-09 DIAGNOSIS — Z8551 Personal history of malignant neoplasm of bladder: Secondary | ICD-10-CM

## 2017-10-09 DIAGNOSIS — I10 Essential (primary) hypertension: Secondary | ICD-10-CM | POA: Diagnosis present

## 2017-10-09 LAB — URINALYSIS, COMPLETE (UACMP) WITH MICROSCOPIC
BILIRUBIN URINE: NEGATIVE
Glucose, UA: NEGATIVE mg/dL
Hgb urine dipstick: NEGATIVE
KETONES UR: 5 mg/dL — AB
Leukocytes, UA: NEGATIVE
Nitrite: NEGATIVE
PROTEIN: 100 mg/dL — AB
Specific Gravity, Urine: 1.016 (ref 1.005–1.030)
pH: 8 (ref 5.0–8.0)

## 2017-10-09 LAB — TYPE AND SCREEN
ABO/RH(D): A POS
ANTIBODY SCREEN: NEGATIVE

## 2017-10-09 LAB — COMPREHENSIVE METABOLIC PANEL
ALBUMIN: 3.7 g/dL (ref 3.5–5.0)
ALT: 13 U/L — ABNORMAL LOW (ref 17–63)
ANION GAP: 11 (ref 5–15)
AST: 26 U/L (ref 15–41)
Alkaline Phosphatase: 81 U/L (ref 38–126)
BILIRUBIN TOTAL: 0.8 mg/dL (ref 0.3–1.2)
BUN: 49 mg/dL — ABNORMAL HIGH (ref 6–20)
CO2: 24 mmol/L (ref 22–32)
Calcium: 8.4 mg/dL — ABNORMAL LOW (ref 8.9–10.3)
Chloride: 100 mmol/L — ABNORMAL LOW (ref 101–111)
Creatinine, Ser: 0.98 mg/dL (ref 0.61–1.24)
GFR calc Af Amer: >60 mL/min (ref >60)
GLUCOSE: 187 mg/dL — AB (ref 65–99)
Potassium: 4.3 mmol/L (ref 3.5–5.1)
Sodium: 135 mmol/L (ref 135–145)
TOTAL PROTEIN: 6.9 g/dL (ref 6.5–8.1)

## 2017-10-09 LAB — LACTIC ACID, PLASMA: Lactic Acid, Venous: 2 mmol/L (ref 0.5–1.9)

## 2017-10-09 LAB — PROTIME-INR
INR: 1.05
Prothrombin Time: 13.6 seconds (ref 11.4–15.2)

## 2017-10-09 LAB — CBC
HCT: 38.9 % — ABNORMAL LOW (ref 40.0–52.0)
Hemoglobin: 12.6 g/dL — ABNORMAL LOW (ref 13.0–18.0)
MCH: 30.1 pg (ref 26.0–34.0)
MCHC: 32.3 g/dL (ref 32.0–36.0)
MCV: 93.2 fL (ref 80.0–100.0)
Platelets: 299 10*3/uL (ref 150–440)
RBC: 4.18 MIL/uL — ABNORMAL LOW (ref 4.40–5.90)
RDW: 14.2 % (ref 11.5–14.5)
WBC: 24.2 10*3/uL — AB (ref 3.8–10.6)

## 2017-10-09 LAB — HEMOGLOBIN AND HEMATOCRIT, BLOOD
HCT: 34.2 % — ABNORMAL LOW (ref 40.0–52.0)
HEMOGLOBIN: 11.4 g/dL — AB (ref 13.0–18.0)

## 2017-10-09 MED ORDER — PANTOPRAZOLE SODIUM 40 MG IV SOLR
80.0000 mg | Freq: Once | INTRAVENOUS | Status: AC
  Administered 2017-10-09: 13:00:00 80 mg via INTRAVENOUS
  Filled 2017-10-09: qty 80

## 2017-10-09 MED ORDER — SODIUM CHLORIDE 0.9 % IV SOLN
INTRAVENOUS | Status: DC
  Administered 2017-10-09 – 2017-10-11 (×4): via INTRAVENOUS

## 2017-10-09 MED ORDER — SODIUM CHLORIDE 0.9 % IV SOLN
1.0000 g | Freq: Once | INTRAVENOUS | Status: AC
  Administered 2017-10-09: 1 g via INTRAVENOUS
  Filled 2017-10-09: qty 10

## 2017-10-09 MED ORDER — SODIUM CHLORIDE 0.9 % IV SOLN: 8.0000 mg/h | INTRAVENOUS | Status: DC

## 2017-10-09 MED ORDER — ACETAMINOPHEN 325 MG PO TABS: 650.0000 mg | ORAL_TABLET | Freq: Four times a day (QID) | ORAL | Status: DC | PRN

## 2017-10-09 MED ORDER — ONDANSETRON HCL 4 MG/2ML IJ SOLN
4.0000 mg | Freq: Once | INTRAMUSCULAR | Status: AC
  Administered 2017-10-09: 4 mg via INTRAVENOUS
  Filled 2017-10-09: qty 2

## 2017-10-09 MED ORDER — ACETAMINOPHEN 650 MG RE SUPP: 650.0000 mg | Freq: Four times a day (QID) | RECTAL | Status: DC | PRN

## 2017-10-09 MED ORDER — SODIUM CHLORIDE 0.9 % IV SOLN
1.0000 g | INTRAVENOUS | Status: DC
  Administered 2017-10-10 – 2017-10-11 (×2): 1 g via INTRAVENOUS
  Filled 2017-10-09 (×3): qty 10

## 2017-10-09 MED ORDER — ONDANSETRON HCL 4 MG/2ML IJ SOLN
4.0000 mg | Freq: Four times a day (QID) | INTRAMUSCULAR | Status: DC | PRN
Start: 2017-10-09 — End: 2017-10-11

## 2017-10-09 MED ORDER — PANTOPRAZOLE SODIUM 40 MG IV SOLR: 40.0000 mg | Freq: Two times a day (BID) | INTRAVENOUS | Status: DC

## 2017-10-09 MED ORDER — SODIUM CHLORIDE 0.9 % IV SOLN
8.0000 mg/h | INTRAVENOUS | Status: DC
  Administered 2017-10-09 – 2017-10-11 (×4): 8 mg/h via INTRAVENOUS
  Filled 2017-10-09 (×5): qty 80

## 2017-10-09 MED ORDER — ONDANSETRON HCL 4 MG PO TABS: 4.0000 mg | ORAL_TABLET | Freq: Four times a day (QID) | ORAL | Status: DC | PRN

## 2017-10-09 NOTE — ED Provider Notes (Signed)
[facility] Emergency Department Provider Note  ____________________________________________  Time seen: Approximately 12:52 PM  I have reviewed the triage vital signs and the nursing notes.   HISTORY  Chief Complaint GI Bleeding  Level 5 caveat:  Portions of the history and physical were unable to be obtained due to dementia   HPI Victor Parks is a 84 y.o. male a history of bladder cancer, upper GI bleed, hypertension who presents for evaluation of hematemesis. patient has a history of dementia and is unable to provide much history. He denies any abdominal pain, dizziness, chest pain or shortness of breath. He is not aware of vomiting blood. According to skilled nursing facility patient has had several episodes of coffee-ground emesis started this morning. No melena. Patient is not on blood thinners. Patient was recently admitted for upper GI bleed and presented with coffee-ground emesis. He did not undergo an endoscopy at that time. He does have a history of peptic ulcer disease.  Past Medical History:  Diagnosis Date  . Bladder cancer ([place])   . Decubitus ulcer   . Hypertension   . Muscle weakness   . UTI (urinary tract infection)     Patient Active Problem List   Diagnosis Date Noted  . GI bleed 09/14/2017  . HTN (hypertension) 09/14/2017  . Closed left hip fracture ([place]) 07/07/2017  . Hypotension 07/07/2017  . 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 Closed displaced intertrochanteric fracture of left femur ([place])   . Severe sepsis with septic shock ([place]) 06/17/2016  . Acute blood loss anemia 06/17/2016  . Septic shock ([place]) 06/16/2016  . Protein-calorie malnutrition, severe 06/15/2016  . Abdominal pain   . Dyspnea 06/13/2016  . Pressure injury of skin 06/13/2016    Past Surgical History:  Procedure Laterality Date  . ESOPHAGOGASTRODUODENOSCOPY (EGD) WITH PROPOFOL N/A 06/20/2016   Procedure: ESOPHAGOGASTRODUODENOSCOPY (EGD) WITH PROPOFOL;   Surgeon: Jonathon Bellows, MD;  Location: [facility] ENDOSCOPY;  Service: Endoscopy;  Laterality: N/A;  . FEMUR IM NAIL Left 07/07/2017   Procedure: INTRAMEDULLARY (IM) NAIL FEMORAL;  Surgeon: Stark Bray, MD;  Location: [facility] ORS;  Service: Orthopedics;  Laterality: Left;  . ILEAL POUCH     s/p bladder cancer  . none      Prior to Admission medications   Medication Sig Start Date End Date Taking? Authorizing Provider  acetaminophen (TYLENOL) 500 MG tablet Take 1,000 mg by mouth 3 (three) times daily. 8 am, 2 pm, 8 pm    [provider]  Calcium Carb-Cholecalciferol (CALCIUM 1000 + D) 1000-800 MG-UNIT TABS Take 1 tablet by mouth daily.    [provider]  Dextromethorphan-guaiFENesin (TUSSIN DM) 10-100 MG/5ML liquid Take 10 mLs by mouth 4 (four) times daily.    [provider]  docusate sodium (COLACE) 100 MG capsule Take 1 capsule (100 mg total) by mouth 2 (two) times daily. 07/10/17   Loletha Grayer, MD  ferrous sulfate 325 (65 FE) MG tablet Take 325 mg by mouth daily with breakfast.    [provider]  HYDROcodone-acetaminophen (NORCO/VICODIN) 5-325 MG tablet Take 1 tablet by mouth every 6 (six) hours as needed for moderate pain. 07/10/17   Loletha Grayer, MD  Menthol-Zinc Oxide (CALMOSEPTINE) 0.44-20.6 % OINT Apply 1 application topically every 12 (twelve) hours as needed. To buttocks for rash    [provider]  Multiple Vitamin (DAILY VITE PO) Take 1 tablet by mouth daily.    [provider]  pantoprazole (PROTONIX) 40 MG tablet Take 1 tablet (40  mg total) by mouth 2 (two) times daily. 07/10/17   Loletha Grayer, MD  potassium citrate (UROCIT-K) 10 MEQ (1080 MG) SR tablet Take 10 mEq by mouth 2 (two) times daily.    [provider]  simethicone (MYLICON) 80 MG chewable tablet Chew 80 mg by mouth 4 (four) times daily.     [provider]  traMADol (ULTRAM) 50 MG tablet Take 1 tablet (50 mg total) by mouth 3 (three) times  daily as needed. 09/16/17   Fritzi Mandes, MD    Allergies Ciprofloxacin  History reviewed. No pertinent family history.  Social History Social History   Tobacco Use  . Smoking status: Former Research scientist (life sciences)  . Smokeless tobacco: Never Used  Substance Use Topics  . Alcohol use: No  . Drug use: No    Review of Systems  Constitutional: Negative for fever. Eyes: Negative for visual changes. ENT: Negative for sore throat. Neck: No neck pain  Cardiovascular: Negative for chest pain. Respiratory: Negative for shortness of breath. Gastrointestinal: Negative for abdominal pain. + hematemesis Genitourinary: Negative for dysuria. Musculoskeletal: Negative for back pain. Skin: Negative for rash. Neurological: Negative for headaches, weakness or numbness. Psych: No SI or HI  ____________________________________________   PHYSICAL EXAM:  VITAL SIGNS: ED Triage Vitals  Enc Vitals Group     BP 10/09/17 1200 125/80     Pulse Rate 10/09/17 1200 87     Resp 10/09/17 1200 14     Temp 10/09/17 1201 97.7 F (36.5 C)     Temp Source 10/09/17 1201 Oral     SpO2 10/09/17 1200 95 %     Weight 10/09/17 1202 149 lb (67.6 kg)     Height 10/09/17 1202 5\' 10"  (1.778 m)     Head Circumference --      Peak Flow --      Pain Score --      Pain Loc --      Pain Edu? --      Excl. in [place]? --     Constitutional: Alert and oriented x 2. Well appearing and in no apparent distress. HEENT:      Head: Normocephalic and atraumatic.         Eyes: Conjunctivae are normal. Sclera is non-icteric.       Mouth/Throat: Mucous membranes are moist.       Neck: Supple with no signs of meningismus. Cardiovascular: Regular rate and rhythm. No murmurs, gallops, or rubs. 2+ symmetrical distal pulses are present in all extremities. No JVD. Respiratory: Normal respiratory effort. Lungs are clear to auscultation bilaterally. No wheezes, crackles, or rhonchi.  Gastrointestinal: Soft, non tender, and non distended with  positive bowel sounds. No rebound or guarding. Genitourinary: No CVA tenderness.rectal exam showing brown stool guaiac negative. Guaiac of emesis is positive Musculoskeletal: Nontender with normal range of motion in all extremities. No edema, cyanosis, or erythema of extremities. Neurologic: Normal speech and language. Face is symmetric. Moving all extremities. No gross focal neurologic deficits are appreciated. Skin: Skin is warm, dry and intact. No rash noted. Psychiatric: Mood and affect are normal. Speech and behavior are normal.  ____________________________________________   LABS (all labs ordered are listed, but only abnormal results are displayed)  Labs Reviewed  COMPREHENSIVE METABOLIC PANEL - Abnormal; Notable for the following components:      Result Value   Chloride 100 (*)    Glucose, Bld 187 (*)    BUN 49 (*)    Calcium 8.4 (*)    ALT  13 (*)    All other components within normal limits  CBC - Abnormal; Notable for the following components:   WBC 24.2 (*)    RBC 4.18 (*)    Hemoglobin 12.6 (*)    HCT 38.9 (*)    All other components within normal limits  URINALYSIS, COMPLETE (UACMP) WITH MICROSCOPIC - Abnormal; Notable for the following components:   Color, Urine AMBER (*)    APPearance TURBID (*)    Ketones, ur 5 (*)    Protein, ur 100 (*)    Bacteria, UA RARE (*)    Squamous Epithelial / LPF 0-5 (*)    All other components within normal limits  LACTIC ACID, PLASMA - Abnormal; Notable for the following components:   Lactic Acid, Venous 2.0 (*)    All other components within normal limits  URINE CULTURE  PROTIME-INR  HEMOGLOBIN AND HEMATOCRIT, BLOOD  HEMOGLOBIN AND HEMATOCRIT, BLOOD  POC OCCULT BLOOD, ED  TYPE AND SCREEN   ____________________________________________  EKG  ED ECG REPORT I, Rudene Re, the attending physician, personally viewed and interpreted this ECG.  Normal sinus rhythm, rate of 91, right bundle branch block, normal QTC,  right axis deviation, no STE or depression. Unchanged from prior  ____________________________________________  RADIOLOGY  none ____________________________________________   PROCEDURES  Procedure(s) performed: None Procedures Critical Care performed:  None ____________________________________________   INITIAL IMPRESSION / ASSESSMENT AND PLAN / ED COURSE   84 y.o. male a history of bladder cancer, PUD, upper GI bleed, hypertension who presents for evaluation of hematemesis since this am. patient is hemodynamically stable, coffee-ground emesis guaiac positive, rectal exam showed brown stool guaiac negative. Hemoglobin is stable. Patient has a history of peptic ulcer disease. He is not on any blood thinners. Patient started on protonix. Type and screen active, No indication for transfusion at this time. Will admit patient for monitoring and trending of hemoglobin. Patient has elevated WBC. He has no abdominal pain or tenderness. He has an ileal conduit with foul smelling urine and lots of sediments in the bag. Will send UA and culture.       As part of my medical decision making, I reviewed the following data within the [facility] notes reviewed and incorporated, Labs reviewed , EKG interpreted , Old EKG reviewed, Old chart reviewed, Discussed with admitting physician , Notes from prior ED visits and [place] Controlled Substance Database    Pertinent labs & imaging results that were available during my care of the patient were reviewed by me and considered in my medical decision making (see chart for details).    ____________________________________________   FINAL CLINICAL IMPRESSION(S) / ED DIAGNOSES  Final diagnoses:  UGIB (upper gastrointestinal bleed)  Urinary tract infection with hematuria, site unspecified      NEW MEDICATIONS STARTED DURING THIS VISIT:  ED Discharge Orders    None       Note:  This document was prepared using Dragon voice  recognition software and may include unintentional dictation errors.Alfred Levins, [place], MD 10/09/17 [phone]

## 2017-10-09 NOTE — H&P (Signed)
[facility] [place] NAME: Victor Parks    MR#:  [id number]  DATE OF BIRTH:  10/24/1923  DATE OF ADMISSION:  10/09/2017  PRIMARY CARE PHYSICIAN: Maryland Pink, MD   REQUESTING/REFERRING PHYSICIAN:   CHIEF COMPLAINT:   Chief Complaint  Patient presents with  . GI Bleeding    HISTORY OF PRESENT ILLNESS: Victor Parks  is a 84 y.o. male with a known history of bladder cancer, has urostomy, hypertension, history of urinary tract infections, GI bleed secondary to esophagitis, gastric ulcers was referred to emergency room of our hospital from [place] facility today.  Patient had some coffee-ground emesis.  Patient has a urostomy.  Patient could not exactly tell information whether he had any rectal bleed too.  Last admission was also for a GI bleed and patient was discharged on 09/26/2017.  Endoscopy was deferred by gastroenterology considering advanced age of the patient.  Patient was put on oral proton pump inhibitor and discharged to the facility in the last admission.  He also has foul-smelling urine which has been sent for culture.  WBC count today is elevated.  Has some generalized weakness.  Hospitalist service was consulted for further care.  PAST MEDICAL HISTORY:   Past Medical History:  Diagnosis Date  . Bladder cancer ([place])   . Decubitus ulcer   . Hypertension   . Muscle weakness   . UTI (urinary tract infection)     PAST SURGICAL HISTORY:  Past Surgical History:  Procedure Laterality Date  . ESOPHAGOGASTRODUODENOSCOPY (EGD) WITH PROPOFOL N/A 06/20/2016   Procedure: ESOPHAGOGASTRODUODENOSCOPY (EGD) WITH PROPOFOL;  Surgeon: Jonathon Bellows, MD;  Location: [facility] ENDOSCOPY;  Service: Endoscopy;  Laterality: N/A;  . FEMUR IM NAIL Left 07/07/2017   Procedure: INTRAMEDULLARY (IM) NAIL FEMORAL;  Surgeon: Stark Bray, MD;  Location: [facility] ORS;  Service: Orthopedics;  Laterality: Left;  . ILEAL POUCH     s/p bladder cancer  .  none      SOCIAL HISTORY:  Social History   Tobacco Use  . Smoking status: Former Research scientist (life sciences)  . Smokeless tobacco: Never Used  Substance Use Topics  . Alcohol use: No    FAMILY HISTORY: No family history on file.  DRUG ALLERGIES:  Allergies  Allergen Reactions  . Ciprofloxacin Anaphylaxis    REVIEW OF SYSTEMS:   CONSTITUTIONAL: No fever,has fatigue  weakness.  EYES: No blurred or double vision.  EARS, NOSE, AND THROAT: No tinnitus or ear pain.  RESPIRATORY: No cough, shortness of breath, wheezing or hemoptysis.  CARDIOVASCULAR: No chest pain, orthopnea, edema.  GASTROINTESTINAL: No nausea, had vomiting,  Had coffee ground emesis No diarrhea or abdominal pain.  GENITOURINARY: No dysuria, hematuria. Has urostomy Foul smelling urine.  ENDOCRINE: No polyuria, nocturia,  HEMATOLOGY: No anemia, easy bruising or bleeding SKIN: No rash or lesion. MUSCULOSKELETAL: No joint pain or arthritis.   NEUROLOGIC: No tingling, numbness, weakness.  PSYCHIATRY: No anxiety or depression.   MEDICATIONS AT HOME:  Prior to Admission medications   Medication Sig Start Date End Date Taking? Authorizing Provider  acetaminophen (TYLENOL) 500 MG tablet Take 1,000 mg by mouth 3 (three) times daily. 8 am, 2 pm, 8 pm    [provider]  Calcium Carb-Cholecalciferol (CALCIUM 1000 + D) 1000-800 MG-UNIT TABS Take 1 tablet by mouth daily.    [provider]  Dextromethorphan-guaiFENesin (TUSSIN DM) 10-100 MG/5ML liquid Take 10 mLs by mouth 4 (four) times daily.    [provider]  docusate  sodium (COLACE) 100 MG capsule Take 1 capsule (100 mg total) by mouth 2 (two) times daily. 07/10/17   Loletha Grayer, MD  ferrous sulfate 325 (65 FE) MG tablet Take 325 mg by mouth daily with breakfast.    [provider]  HYDROcodone-acetaminophen (NORCO/VICODIN) 5-325 MG tablet Take 1 tablet by mouth every 6 (six) hours as needed for moderate pain. 07/10/17   Loletha Grayer, MD   Menthol-Zinc Oxide (CALMOSEPTINE) 0.44-20.6 % OINT Apply 1 application topically every 12 (twelve) hours as needed. To buttocks for rash    [provider]  Multiple Vitamin (DAILY VITE PO) Take 1 tablet by mouth daily.    [provider]  pantoprazole (PROTONIX) 40 MG tablet Take 1 tablet (40 mg total) by mouth 2 (two) times daily. 07/10/17   Loletha Grayer, MD  potassium citrate (UROCIT-K) 10 MEQ (1080 MG) SR tablet Take 10 mEq by mouth 2 (two) times daily.    [provider]  simethicone (MYLICON) 80 MG chewable tablet Chew 80 mg by mouth 4 (four) times daily.     [provider]  traMADol (ULTRAM) 50 MG tablet Take 1 tablet (50 mg total) by mouth 3 (three) times daily as needed. 09/16/17   Fritzi Mandes, MD      PHYSICAL EXAMINATION:   VITAL SIGNS: Blood pressure 122/67, pulse 84, temperature 97.7 F (36.5 C), temperature source Oral, resp. rate 19, height 5\' 10"  (1.778 m), weight 67.6 kg (149 lb), SpO2 97 %.  GENERAL:  84 y.o.-year-old patient lying in the bed with no acute distress.  EYES: Pupils equal, round, reactive to light and accommodation. No scleral icterus. Extraocular muscles intact.  HEENT: Head atraumatic, normocephalic. Oropharynx dry and nasopharynx clear.  NECK:  Supple, no jugular venous distention. No thyroid enlargement, no tenderness.  LUNGS: Normal breath sounds bilaterally, no wheezing, rales,rhonchi or crepitation. No use of accessory muscles of respiration.  CARDIOVASCULAR: S1, S2 normal. No murmurs, rubs, or gallops.  ABDOMEN: Soft, nontender, nondistended. Bowel sounds present. No organomegaly or mass.  Has urostomy EXTREMITIES: No pedal edema, cyanosis, or clubbing.  NEUROLOGIC: Cranial nerves II through XII are intact. Muscle strength 5/5 in all extremities. Sensation intact. Gait not checked.  PSYCHIATRIC: The patient is alert and oriented x 3.  SKIN: No obvious rash, lesion, or ulcer.   LABORATORY PANEL:    CBC Recent Labs  Lab 10/09/17 1205  WBC 24.2*  HGB 12.6*  HCT 38.9*  PLT 299  MCV 93.2  MCH 30.1  MCHC 32.3  RDW 14.2   ------------------------------------------------------------------------------------------------------------------  Chemistries  Recent Labs  Lab 10/09/17 1205  NA 135  K 4.3  CL 100*  CO2 24  GLUCOSE 187*  BUN 49*  CREATININE 0.98  CALCIUM 8.4*  AST 26  ALT 13*  ALKPHOS 81  BILITOT 0.8   ------------------------------------------------------------------------------------------------------------------ estimated creatinine clearance is 46.9 mL/min (by C-G formula based on SCr of 0.98 mg/dL). ------------------------------------------------------------------------------------------------------------------ No results for input(s): TSH, T4TOTAL, T3FREE, THYROIDAB in the last 72 hours.  Invalid input(s): FREET3   Coagulation profile Recent Labs  Lab 10/09/17 1205  INR 1.05   ------------------------------------------------------------------------------------------------------------------- No results for input(s): DDIMER in the last 72 hours. -------------------------------------------------------------------------------------------------------------------  Cardiac Enzymes No results for input(s): CKMB, TROPONINI, MYOGLOBIN in the last 168 hours.  Invalid input(s): CK ------------------------------------------------------------------------------------------------------------------ Invalid input(s): POCBNP  ---------------------------------------------------------------------------------------------------------------  Urinalysis    Component Value Date/Time   COLORURINE YELLOW (A) 09/14/2017 0805   APPEARANCEUR TURBID (A) 09/14/2017 0805   LABSPEC 1.013 09/14/2017 0805  PHURINE 8.0 09/14/2017 0805   GLUCOSEU NEGATIVE 09/14/2017 0805   HGBUR NEGATIVE 09/14/2017 0805   BILIRUBINUR NEGATIVE 09/14/2017 0805   KETONESUR 5 (A) 09/14/2017  0805   PROTEINUR NEGATIVE 09/14/2017 0805   NITRITE POSITIVE (A) 09/14/2017 0805   LEUKOCYTESUR NEGATIVE 09/14/2017 0805     RADIOLOGY: No results found.  EKG: Orders placed or performed during the hospital encounter of 10/09/17  . EKG 12-Lead  . EKG 12-Lead    IMPRESSION AND PLAN:  84 year old male patient who is a resident of [facility] facility DNR by [place] with history of bladder cancer, urostomy, urinary tract infection in the past, gastrointestinal bleeding secondary to gastric ulcer was referred for coffee-ground emesis.  1.Gastrointestinal bleeding Admit patient to medical floor inpatient service IV fluid hydration IV Protonix drip Gastroenterology consultation N.p.o. for now  2.  Urinary tract infection Patient on IV Rocephin antibiotic  3.  Dehydration IV fluids  4.  Bladder cancer with urostomy Supportive care  5.  DVT prophylaxis Sequential compression device to lower extremities  All the records are reviewed and case discussed with ED provider. Management plans discussed with the patient, family and they are in agreement.  CODE STATUS:DNR Code Status History    Date Active Date Inactive Code Status Order ID Comments User Context   09/14/2017 15:07 09/16/2017 16:01 DNR [id number]  Idelle Crouch, MD Inpatient   07/07/2017 13:35 07/10/2017 13:09 DNR [id number]  Idelle Crouch, MD Inpatient   07/07/2017 13:29 07/07/2017 13:35 Full Code [id number]  Idelle Crouch, MD Inpatient   06/13/2016 05:31 06/22/2016 15:14 DNR [id number]  Saundra Shelling, MD Inpatient   05/29/2016 08:18 05/29/2016 17:01 DNR [id number]  Merlyn Lot, MD ED    Questions for Most Recent Historical Code Status (Order [id number])    Question Answer Comment   In the event of cardiac or respiratory ARREST Do not call a "code blue"    In the event of cardiac or respiratory ARREST Do not perform Intubation, CPR, defibrillation or ACLS    In the event of cardiac or respiratory ARREST  Use medication by any route, position, wound care, and other measures to relive pain and suffering. May use oxygen, suction and manual treatment of airway obstruction as needed for comfort.         Advance Directive Documentation     Most Recent Value  Type of Advance Directive  Out of facility DNR (Parks MOST or yellow form)  Pre-existing out of facility DNR order (yellow form or Parks MOST form)  Yellow form placed in chart (order not valid for inpatient use)  "MOST" Form in Place?  No data       TOTAL TIME TAKING CARE OF THIS PATIENT: 53 minutes.    Saundra Shelling M.D on 10/09/2017 at 1:27 PM  Between 7am to 6pm - Pager - [phone]  After 6pm go to [email or web address] - password [email or web address] [facility]  [facility]  Office  [phone]  CC: Primary care physician; Maryland Pink, MD

## 2017-10-09 NOTE — ED Triage Notes (Signed)
Pt arrives via [facility] from [facility] with complaints of vomiting coffee grounds since this morning. Per EMS, pt was here and dx 3 weeks ago with an ulcer. Pt denying any pain at this time.

## 2017-10-09 NOTE — Progress Notes (Signed)
ANTIBIOTIC CONSULT NOTE - INITIAL  Pharmacy Consult for ceftriaxone Indication: UTI  Allergies  Allergen Reactions  . Ciprofloxacin Anaphylaxis    Patient Measurements: Height: 5\' 10"  (177.8 cm) Weight: 149 lb (67.6 kg) IBW/kg (Calculated) : 73 Adjusted Body Weight:   Vital Signs: Temp: 97.7 F (36.5 C) (03/13 1201) Temp Source: Oral (03/13 1201) BP: 122/67 (03/13 1300) Pulse Rate: 84 (03/13 1300) Intake/Output from previous day: No intake/output data recorded. Intake/Output from this shift: Total I/O In: 100 [IV Piggyback:100] Out: 150 [Urine:150]  Labs: Recent Labs    10/09/17 1205  WBC 24.2*  HGB 12.6*  PLT 299  CREATININE 0.98   Estimated Creatinine Clearance: 46.9 mL/min (by C-G formula based on SCr of 0.98 mg/dL). No results for input(s): VANCOTROUGH, VANCOPEAK, VANCORANDOM, GENTTROUGH, GENTPEAK, GENTRANDOM, TOBRATROUGH, TOBRAPEAK, TOBRARND, AMIKACINPEAK, AMIKACINTROU, AMIKACIN in the last 72 hours.   Microbiology: Recent Results (from the past 720 hour(s))  MRSA PCR Screening     Status: None   Collection Time: 09/14/17  3:13 PM  Result Value Ref Range Status   MRSA by PCR NEGATIVE NEGATIVE Final    Comment:        The GeneXpert MRSA Assay (FDA approved for NASAL specimens only), is one component of a comprehensive MRSA colonization surveillance program. It is not intended to diagnose MRSA infection nor to guide or monitor treatment for MRSA infections. Performed at [facility], [address] [place], [place] [address]     Medical History: Past Medical History:  Diagnosis Date  . Bladder cancer ([place])   . Decubitus ulcer   . Hypertension   . Muscle weakness   . UTI (urinary tract infection)     Medications:  Infusions:  . cefTRIAXone (ROCEPHIN)  IV    . [START ON 10/10/2017] cefTRIAXone (ROCEPHIN)  IV    . pantoprozole (PROTONIX) infusion 8 mg/hr (10/09/17 1325)   Assessment: 89 yom cc GIB with PMH bladder cancer with  urostomy, HTN, hx UTI, GIB secondary to esophagitis, gastric ulcers. Referred from ALF d/t coffee-ground emesis. Foul smelling urine noted and sent for culture, pharmacy consulted to dose ceftriaxone for UTI.   Goal of Therapy:  Resolve infection Prevent ADE  Plan:  Ceftriaxone 1 gm IV Q24H. Will f/u UCx.   Laural Benes, Pharm.D., BCPS Clinical Pharmacist 10/09/2017,1:40 PM

## 2017-10-10 DIAGNOSIS — K922 Gastrointestinal hemorrhage, unspecified: Secondary | ICD-10-CM

## 2017-10-10 LAB — BASIC METABOLIC PANEL
Anion gap: 7 (ref 5–15)
BUN: 33 mg/dL — AB (ref 6–20)
CHLORIDE: 111 mmol/L (ref 101–111)
CO2: 23 mmol/L (ref 22–32)
CREATININE: 0.96 mg/dL (ref 0.61–1.24)
Calcium: 8.2 mg/dL — ABNORMAL LOW (ref 8.9–10.3)
GFR calc non Af Amer: >60 mL/min (ref >60)
Glucose, Bld: 99 mg/dL (ref 65–99)
POTASSIUM: 3.8 mmol/L (ref 3.5–5.1)
SODIUM: 141 mmol/L (ref 135–145)

## 2017-10-10 LAB — HEMOGLOBIN AND HEMATOCRIT, BLOOD
HCT: 32.9 % — ABNORMAL LOW (ref 40.0–52.0)
HEMATOCRIT: 30.4 % — AB (ref 40.0–52.0)
HEMATOCRIT: 28.8 % — AB (ref 40.0–52.0)
HEMOGLOBIN: 9.9 g/dL — AB (ref 13.0–18.0)
Hemoglobin: 10.8 g/dL — ABNORMAL LOW (ref 13.0–18.0)
Hemoglobin: 9.6 g/dL — ABNORMAL LOW (ref 13.0–18.0)

## 2017-10-10 NOTE — Progress Notes (Addendum)
[facility] at [place] NAME: Victor Parks    MR#:  [id number]  DATE OF BIRTH:  Sep 27, 1923  SUBJECTIVE:  Patient with dementia  REVIEW OF SYSTEMS:    Unable to obtain Patient with dementia  Tolerating Diet:NPO      DRUG ALLERGIES:   Allergies  Allergen Reactions  . Ciprofloxacin Anaphylaxis    VITALS:  Blood pressure (!) 100/39, pulse 72, temperature 98.5 F (36.9 C), temperature source Oral, resp. rate 18, height 5\' 10"  (1.778 m), weight 67.6 kg (149 lb), SpO2 96 %.  PHYSICAL EXAMINATION:  Constitutional: Appears well-developed and well-nourished. No distress. HENT: Normocephalic. [name] Oropharynx is clear and moist.  Eyes: Conjunctivae and EOM are normal. PERRLA, no scleral icterus.  Neck: Normal ROM. Neck supple. No JVD. No tracheal deviation. CVS: RRR, S1/S2 +, no murmurs, no gallops, no carotid bruit.  Pulmonary: Effort and breath sounds normal, no stridor, rhonchi, wheezes, rales.  Abdominal: Soft. BS +,  no distension, tenderness, rebound or guarding.  urostomy Musculoskeletal:  No edema and no tenderness.  Neuro: Alert.  No obvious focal deficits  skin: Skin is warm and dry. No rash noted. Psychiatric: dementia     LABORATORY PANEL:   CBC Recent Labs  Lab 10/09/17 1205  10/10/17 0927  WBC 24.2*  --   --   HGB 12.6*   < > 9.9*  HCT 38.9*   < > 30.4*  PLT 299  --   --    < > = values in this interval not displayed.   ------------------------------------------------------------------------------------------------------------------  Chemistries  Recent Labs  Lab 10/09/17 1205 10/10/17 0927  NA 135 141  K 4.3 3.8  CL 100* 111  CO2 24 23  GLUCOSE 187* 99  BUN 49* 33*  CREATININE 0.98 0.96  CALCIUM 8.4* 8.2*  AST 26  --   ALT 13*  --   ALKPHOS 81  --   BILITOT 0.8  --    ------------------------------------------------------------------------------------------------------------------  Cardiac  Enzymes No results for input(s): TROPONINI in the last 168 hours. ------------------------------------------------------------------------------------------------------------------  RADIOLOGY:  No results found.   ASSESSMENT AND PLAN:   84 year old male with history of bladder cancer and recent upper GI bleed who presented from nursing home due to coffee-ground emesis.  1.  Coffee-ground emesis with history of peptic ulcer disease: Continue PPI GI consultation pending Likely family will defer endoscopy as last admission.  2.  Acute on chronic anemia due to coffee-ground emesis Continue to monitor CBC No indication for blood transfusion  3.  History of bladder cancer with urostomy  He will need total assistance with pouch application and emptying    4.  UTI: Continue Rocephin and follow-up on urine culture Follow CBC  PT and CSW consult placed   CODE STATUS: DNR  TOTAL TIME TAKING CARE OF THIS PATIENT: 30 minutes.     POSSIBLE D/C tomorrow, DEPENDING ON CLINICAL CONDITION.   Aashrith Eves M.D on 10/10/2017 at 11:01 AM  Between 7am to 6pm - Pager - [phone] After 6pm go to [email or web address] - password [email or web address] [place]  Office  [phone]  CC: Primary care physician; Maryland Pink, MD  Note: This dictation was prepared with Dragon dictation along with smaller phrase technology. Any transcriptional errors that result from this process are unintentional.

## 2017-10-10 NOTE — Consult Note (Addendum)
[facility] Nurse ostomy consult note Consult requested for urostomy; which has been in place for several years, according to the EMR. Stomal assessment/size: 1" round, flush with skin level, pink and moist Peristomal assessment: Intact.  Barrier ring to attempt to maintain a seal.  Output: Mod amt yellow urine Ostomy pouching: 2pc. 2 1/4" pouch with barrier ring, connected to bedside drainage bag.   Education provided: None Enrolled patient in [place] program: No; Urostomy was present prior to admission.  Pt is sleepy and did not discuss urosotmy; no family present at the bedside to discuss plan of care.  He will need total assistance with pouch application and emptying after discharge since he does not perform these procedures. Supplies left at the bedside for staff nurse use. Please re-consult if further assistance is needed.  Thank-you,  Julien Girt MSN, [place], [place], [place], [place]

## 2017-10-10 NOTE — Consult Note (Signed)
Victor Lame, MD [facility]  [address] [place], [place] [address] Phone: [phone] Fax : [phone]  Consultation  Referring Provider:     Dr. Benjie Karvonen Primary Care Physician:  Maryland Pink, MD Primary Gastroenterologist:  Dr. Vicente Males         Reason for Consultation:     Hematemesis  Date of Admission:  10/09/2017 Date of Consultation:  10/10/2017         HPI:   Victor Parks is a 84 y.o. male who comes in with coffee-ground emesis.  The patient had an upper endoscopy by Dr. Vicente Males back in November 2017 and was found to have severe esophagitis with an esophageal ulcer.  The patient also had long segment Barrett's found.  The patient was also seen in the hospital by Dr. Vicente Males in February.  At that time it was decided the patient would not undergo a procedure due to his advanced age and the risk of the procedure.  The patient is not able to give any history and is not able to tell me where he is or what year it is.  He can also not tell me where he lives.  The patient has not had any black stools or bloody stools and the nurse states that he has not had any sign of any GI bleeding.  The patient's baseline hemoglobin is between 11 and 12.9.  The patient came in with a hemoglobin of 12.6 that has gone down to 9.9.    Past Medical History:  Diagnosis Date  . Bladder cancer ([place])   . Decubitus ulcer   . Hypertension   . Muscle weakness   . UTI (urinary tract infection)     Past Surgical History:  Procedure Laterality Date  . ESOPHAGOGASTRODUODENOSCOPY (EGD) WITH PROPOFOL N/A 06/20/2016   Procedure: ESOPHAGOGASTRODUODENOSCOPY (EGD) WITH PROPOFOL;  Surgeon: Jonathon Bellows, MD;  Location: [facility] ENDOSCOPY;  Service: Endoscopy;  Laterality: N/A;  . FEMUR IM NAIL Left 07/07/2017   Procedure: INTRAMEDULLARY (IM) NAIL FEMORAL;  Surgeon: Stark Bray, MD;  Location: [facility] ORS;  Service: Orthopedics;  Laterality: Left;  . ILEAL POUCH     s/p bladder cancer  . none      Prior to Admission  medications   Medication Sig Start Date End Date Taking? Authorizing Provider  Calcium Carb-Cholecalciferol (CALCIUM 1000 + D) 1000-800 MG-UNIT TABS Take 1 tablet by mouth daily.   Yes [provider]  docusate sodium (COLACE) 100 MG capsule Take 1 capsule (100 mg total) by mouth 2 (two) times daily. 07/10/17  Yes Wieting, Richard, MD  ferrous sulfate 325 (65 FE) MG tablet Take 325 mg by mouth daily with breakfast.   Yes [provider]  Multiple Vitamin (DAILY VITE PO) Take 1 tablet by mouth daily.   Yes [provider]  pantoprazole (PROTONIX) 40 MG tablet Take 1 tablet (40 mg total) by mouth 2 (two) times daily. 07/10/17  Yes Wieting, Richard, MD  potassium citrate (UROCIT-K) 10 MEQ (1080 MG) SR tablet Take 10 mEq by mouth 2 (two) times daily.   Yes [provider]  acetaminophen (TYLENOL) 500 MG tablet Take 1,000 mg by mouth 3 (three) times daily. 8 am, 2 pm, 8 pm    [provider]  HYDROcodone-acetaminophen (NORCO/VICODIN) 5-325 MG tablet Take 1 tablet by mouth every 6 (six) hours as needed for moderate pain. 07/10/17   Loletha Grayer, MD  Menthol-Zinc Oxide (CALMOSEPTINE) 0.44-20.6 % OINT Apply 1 application topically every 12 (twelve) hours  as needed. To buttocks for rash    [provider]  simethicone (MYLICON) 80 MG chewable tablet Chew 80 mg by mouth 4 (four) times daily.     [provider]  traMADol (ULTRAM) 50 MG tablet Take 1 tablet (50 mg total) by mouth 3 (three) times daily as needed. 09/16/17   Fritzi Mandes, MD    History reviewed. No pertinent family history.   Social History   Tobacco Use  . Smoking status: Former Research scientist (life sciences)  . Smokeless tobacco: Never Used  Substance Use Topics  . Alcohol use: No  . Drug use: No    Allergies as of 10/09/2017 - Review Complete 10/09/2017  Allergen Reaction Noted  . Ciprofloxacin Anaphylaxis 05/29/2016    Review of Systems:    All systems reviewed and negative except  where noted in HPI.   Physical Exam:  Vital signs in last 24 hours: Temp:  [97.1 F (36.2 C)-99.3 F (37.4 C)] 98.7 F (37.1 C) (03/14 1256) Pulse Rate:  [72-117] 73 (03/14 1256) Resp:  [17-18] 18 (03/14 1256) BP: (92-137)/(36-72) 96/36 (03/14 1256) SpO2:  [96 %-99 %] 99 % (03/14 1256) Last BM Date: 10/09/17 General:   Pleasant, cooperative in NAD confused. Head:  Normocephalic and atraumatic. Eyes:   No icterus.   Conjunctiva pink. PERRLA. Ears:  Normal auditory acuity. Neck:  Supple; no masses or thyroidomegaly Lungs: Respirations even and unlabored. Lungs clear to auscultation bilaterally.   No wheezes, crackles, or rhonchi.  Heart:  Regular rate and rhythm;  Without murmur, clicks, rubs or gallops Abdomen:  Soft, nondistended, nontender. Normal bowel sounds. No appreciable masses or hepatomegaly.  No rebound or guarding.  Rectal:  Not performed. Msk:  Symmetrical without gross deformities.  Extremities:  Without edema, cyanosis or clubbing. Neurologic:  Alert and only orientated to person.  Grossly normal neurologically. Skin:  Intact without significant lesions or rashes. Cervical Nodes:  No significant cervical adenopathy. Psych:  Alert and cooperative. Victor Parks  LAB RESULTS: Recent Labs    10/09/17 1205 10/09/17 1806 10/10/17 0014 10/10/17 0927  WBC 24.2*  --   --   --   HGB 12.6* 11.4* 10.8* 9.9*  HCT 38.9* 34.2* 32.9* 30.4*  PLT 299  --   --   --    BMET Recent Labs    10/09/17 1205 10/10/17 0927  NA 135 141  K 4.3 3.8  CL 100* 111  CO2 24 23  GLUCOSE 187* 99  BUN 49* 33*  CREATININE 0.98 0.96  CALCIUM 8.4* 8.2*   LFT Recent Labs    10/09/17 1205  PROT 6.9  ALBUMIN 3.7  AST 26  ALT 13*  ALKPHOS 81  BILITOT 0.8   PT/INR Recent Labs    10/09/17 1205  LABPROT 13.6  INR 1.05    STUDIES: No results found.    Impression / Plan:   Maxemiliano Riel is a 84 y.o. y/o male with who came in with a report of hematemesis.  The patient has not had  any sign of any further bleeding and is unable to give any history since he is quite confused.  The patient is a high risk for any endoscopic procedures being that he is 84 years old.  The patient will be followed to make sure there is no further sign of any overt bleeding.  I discussed this with the hospitalist who reports she is in agreement with my plan.  Thank you for involving me in the care of this patient.  LOS: 1 day   Victor Lame, MD  10/10/2017, 2:17 PM   Note: This dictation was prepared with Dragon dictation along with smaller phrase technology. Any transcriptional errors that result from this process are unintentional.

## 2017-10-10 NOTE — Progress Notes (Signed)
PT Cancellation Note  Patient Details Name: Victor Parks MRN: [id number] DOB: 04-09-1924   Cancelled Treatment:    Reason Eval/Treat Not Completed: Patient not medically ready.  Chart reviewed and RN contacted.  Pt is too hypotensive and confused to participate with PT today.  Will re-attempt 10/10/2017.   Roxanne Gates, PT, DPT 10/10/2017, 2:16 PM

## 2017-10-11 LAB — URINE CULTURE

## 2017-10-11 LAB — CBC
HEMATOCRIT: 27.3 % — AB (ref 40.0–52.0)
Hemoglobin: 9.1 g/dL — ABNORMAL LOW (ref 13.0–18.0)
MCH: 30.9 pg (ref 26.0–34.0)
MCHC: 33.2 g/dL (ref 32.0–36.0)
MCV: 93.2 fL (ref 80.0–100.0)
PLATELETS: 185 10*3/uL (ref 150–440)
RBC: 2.93 MIL/uL — ABNORMAL LOW (ref 4.40–5.90)
RDW: 14.3 % (ref 11.5–14.5)
WBC: 9.6 10*3/uL (ref 3.8–10.6)

## 2017-10-11 LAB — BASIC METABOLIC PANEL
ANION GAP: 8 (ref 5–15)
BUN: 20 mg/dL (ref 6–20)
CO2: 24 mmol/L (ref 22–32)
Calcium: 8.1 mg/dL — ABNORMAL LOW (ref 8.9–10.3)
Chloride: 109 mmol/L (ref 101–111)
Creatinine, Ser: 0.99 mg/dL (ref 0.61–1.24)
Glucose, Bld: 96 mg/dL (ref 65–99)
POTASSIUM: 3.4 mmol/L — AB (ref 3.5–5.1)
SODIUM: 141 mmol/L (ref 135–145)

## 2017-10-11 MED ORDER — CEPHALEXIN 500 MG PO CAPS: 500.0000 mg | ORAL_CAPSULE | Freq: Two times a day (BID) | ORAL | 0 refills | Status: AC

## 2017-10-11 MED ORDER — HYDROCODONE-ACETAMINOPHEN 5-325 MG PO TABS: 1.0000 | ORAL_TABLET | Freq: Four times a day (QID) | ORAL | 0 refills | Status: AC | PRN

## 2017-10-11 MED ORDER — TRAMADOL HCL 50 MG PO TABS: 50.0000 mg | ORAL_TABLET | Freq: Three times a day (TID) | ORAL | 0 refills | Status: AC | PRN

## 2017-10-11 NOTE — Clinical Social Work Note (Signed)
Clinical Social Work Assessment  Patient Details  Name: Victor Parks MRN: [id number] Date of Birth: [birth year]  Date of referral:  10/11/17               Reason for consult:  Discharge Planning                Permission sought to share information with:    Permission granted to share information::     Name::        Agency::     Relationship::     Contact Information:     Housing/Transportation Living arrangements for the past 2 months:    Source of Information:    Patient Interpreter Needed:    Criminal Activity/Legal Involvement Pertinent to Current Situation/Hospitalization:    Significant Relationships:  Adult Children Lives with:  Facility Resident Do you feel safe going back to the place where you live?  Yes Need for family participation in patient care:  Yes (Comment)  Care giving concerns:  Patient resides at [facility].   Social Worker assessment / plan:  CSW informed patient is a resident at [facility] and that patient is ready for discharge today. PT worked with patient today and stated he could return to his ALF level of care with home health. Patient already has [facility] following. CSW contacted Lattie Haw at [place] and she reviewed patient's discharge information and she stated that they can take patient back today. CSW spoke with patient's daughter, Ms. Scoggins, via phone and she is in agreement with discharge but states she cannot transport.  Employment status:  Retired Forensic scientist:  Medicare PT Recommendations:  Home with [place] / Referral to community resources:     Patient/Family's Response to care:  Patient's daughter expressed appreciation for CSW assistance.  Patient/Family's Understanding of and Emotional Response to Diagnosis, Current Treatment, and Prognosis:  Patient's daughter is involved in patient's treatment plan.  Emotional Assessment Appearance:    Attitude/Demeanor/Rapport:    Affect (typically  observed):    Orientation:    Alcohol / Substance use:  Not Applicable Psych involvement (Current and /or in the community):  No (Comment)  Discharge Needs  Concerns to be addressed:  Care Coordination Readmission within the last 30 days:  No Current discharge risk:  None Barriers to Discharge:  No Barriers Identified   Shela Leff, LCSW 10/11/2017, 2:40 PM

## 2017-10-11 NOTE — Discharge Summary (Signed)
[facility] [place] NAME: Victor Parks    MR#:  [id number]  DATE OF BIRTH:  03/08/1924  DATE OF ADMISSION:  10/09/2017 ADMITTING PHYSICIAN: Saundra Shelling, MD  DATE OF DISCHARGE: 10/11/2017  PRIMARY CARE PHYSICIAN: Maryland Pink, MD    ADMISSION DIAGNOSIS:  UGIB (upper gastrointestinal bleed) [K92.2]  DISCHARGE DIAGNOSIS:  Active Problems:   GI bleed   UGIB (upper gastrointestinal bleed)   SECONDARY DIAGNOSIS:   Past Medical History:  Diagnosis Date  . Bladder cancer ([place])   . Decubitus ulcer   . Hypertension   . Muscle weakness   . UTI (urinary tract infection)     HOSPITAL COURSE:   84 year old male with history of bladder cancer and recent upper GI bleed who presented from nursing home due to coffee-ground emesis.  1.  Coffee-ground emesis with history of peptic ulcer disease: Hemoglobin has remained relatively stable.  Patient with GI. Patient not had any further bleeding.  Patient is high risk for endoscopic procedure given her age. He will continue PPI   2.  Acute on chronic anemia due to coffee-ground emesis  There was no indication for blood transfusion  3.  History of bladder cancer with urostomy  He will need total assistance with pouch application and emptying    4.  UTI: He will be discharged on oral Keflex. He should continue back on Macrobid after completion of Keflex.  Patient will benefit from palliative care consultation    DISCHARGE CONDITIONS AND DIET:  Stable Regular diet  CONSULTS OBTAINED:  Treatment Team:  [name], MD  DRUG ALLERGIES:   Allergies  Allergen Reactions  . Ciprofloxacin Anaphylaxis    DISCHARGE MEDICATIONS:   Allergies as of 10/11/2017      Reactions   Ciprofloxacin Anaphylaxis      Medication List    TAKE these medications   acetaminophen 500 MG tablet Commonly known as:  TYLENOL Take 1,000 mg by mouth 3 (three) times daily. 8 am, 2 pm, 8  pm   CALCIUM 1000 + D 1000-800 MG-UNIT Tabs Generic drug:  Calcium Carb-Cholecalciferol Take 1 tablet by mouth daily.   CALMOSEPTINE 0.44-20.6 % Oint Generic drug:  Menthol-Zinc Oxide Apply 1 application topically every 12 (twelve) hours as needed. To buttocks for rash   cephALEXin 500 MG capsule Commonly known as:  KEFLEX Take 1 capsule (500 mg total) by mouth 2 (two) times daily for 4 days.   DAILY VITE PO Take 1 tablet by mouth daily.   docusate sodium 100 MG capsule Commonly known as:  COLACE Take 1 capsule (100 mg total) by mouth 2 (two) times daily.   ferrous sulfate 325 (65 FE) MG tablet Take 325 mg by mouth daily with breakfast.   HYDROcodone-acetaminophen 5-325 MG tablet Commonly known as:  NORCO/VICODIN Take 1 tablet by mouth every 6 (six) hours as needed for moderate pain.   pantoprazole 40 MG tablet Commonly known as:  PROTONIX Take 1 tablet (40 mg total) by mouth 2 (two) times daily.   potassium citrate 10 MEQ (1080 MG) SR tablet Commonly known as:  UROCIT-K Take 10 mEq by mouth 2 (two) times daily.   simethicone 80 MG chewable tablet Commonly known as:  MYLICON Chew 80 mg by mouth 4 (four) times daily.   traMADol 50 MG tablet Commonly known as:  ULTRAM Take 1 tablet (50 mg total) by mouth 3 (three) times daily as needed.         Today  CHIEF COMPLAINT:  No issues overnight   VITAL SIGNS:  Blood pressure (!) 99/38, pulse 64, temperature 98.9 F (37.2 C), resp. rate 19, height 5\' 10"  (1.778 m), weight 67.6 kg (149 lb), SpO2 99 %.   REVIEW OF SYSTEMS:  ROS   PHYSICAL EXAMINATION:  GENERAL:  84 y.o.-year-old patient lying in the bed with no acute distress.  NECK:  Supple, no jugular venous distention. No thyroid enlargement, no tenderness.  LUNGS: Normal breath sounds bilaterally, no wheezing, rales,rhonchi  No use of accessory muscles of respiration.  CARDIOVASCULAR: S1, S2 normal. No murmurs, rubs, or gallops.  ABDOMEN: Soft,  non-tender, non-distended. Bowel sounds present. No organomegaly or mass.  EXTREMITIES: No pedal edema, cyanosis, or clubbing.  PSYCHIATRIC: The patient is alert and oriented x name SKIN: No obvious rash, lesion, or ulcer.   DATA REVIEW:   CBC Recent Labs  Lab 10/11/17 0659  WBC 9.6  HGB 9.1*  HCT 27.3*  PLT 185    Chemistries  Recent Labs  Lab 10/09/17 1205  10/11/17 0659  NA 135   < > 141  K 4.3   < > 3.4*  CL 100*   < > 109  CO2 24   < > 24  GLUCOSE 187*   < > 96  BUN 49*   < > 20  CREATININE 0.98   < > 0.99  CALCIUM 8.4*   < > 8.1*  AST 26  --   --   ALT 13*  --   --   ALKPHOS 81  --   --   BILITOT 0.8  --   --    < > = values in this interval not displayed.    Cardiac Enzymes No results for input(s): TROPONINI in the last 168 hours.  Microbiology Results  @MICRORSLT48 @  RADIOLOGY:  No results found.    Allergies as of 10/11/2017      Reactions   Ciprofloxacin Anaphylaxis      Medication List    TAKE these medications   acetaminophen 500 MG tablet Commonly known as:  TYLENOL Take 1,000 mg by mouth 3 (three) times daily. 8 am, 2 pm, 8 pm   CALCIUM 1000 + D 1000-800 MG-UNIT Tabs Generic drug:  Calcium Carb-Cholecalciferol Take 1 tablet by mouth daily.   CALMOSEPTINE 0.44-20.6 % Oint Generic drug:  Menthol-Zinc Oxide Apply 1 application topically every 12 (twelve) hours as needed. To buttocks for rash   cephALEXin 500 MG capsule Commonly known as:  KEFLEX Take 1 capsule (500 mg total) by mouth 2 (two) times daily for 4 days.   DAILY VITE PO Take 1 tablet by mouth daily.   docusate sodium 100 MG capsule Commonly known as:  COLACE Take 1 capsule (100 mg total) by mouth 2 (two) times daily.   ferrous sulfate 325 (65 FE) MG tablet Take 325 mg by mouth daily with breakfast.   HYDROcodone-acetaminophen 5-325 MG tablet Commonly known as:  NORCO/VICODIN Take 1 tablet by mouth every 6 (six) hours as needed for moderate pain.   pantoprazole  40 MG tablet Commonly known as:  PROTONIX Take 1 tablet (40 mg total) by mouth 2 (two) times daily.   potassium citrate 10 MEQ (1080 MG) SR tablet Commonly known as:  UROCIT-K Take 10 mEq by mouth 2 (two) times daily.   simethicone 80 MG chewable tablet Commonly known as:  MYLICON Chew 80 mg by mouth 4 (four) times daily.   traMADol 50 MG tablet Commonly known as:  ULTRAM Take 1 tablet (50 mg total) by mouth 3 (three) times daily as needed.         Management plans discussed with the patient's daughter and she is in agreement. Stable for discharge   Patient should follow up with pcp  CODE STATUS:     Code Status Orders  (From admission, onward)        Start     Ordered   10/09/17 1536  Do not attempt resuscitation (DNR)  Continuous    Question Answer Comment  In the event of cardiac or respiratory ARREST Do not call a "code blue"   In the event of cardiac or respiratory ARREST Do not perform Intubation, CPR, defibrillation or ACLS   In the event of cardiac or respiratory ARREST Use medication by any route, position, wound care, and other measures to relive pain and suffering. May use oxygen, suction and manual treatment of airway obstruction as needed for comfort.      10/09/17 1535    Code Status History    Date Active Date Inactive Code Status Order ID Comments User Context   09/14/2017 15:07 09/16/2017 16:01 DNR [id number]  Idelle Crouch, MD Inpatient   07/07/2017 13:35 07/10/2017 13:09 DNR [id number]  Idelle Crouch, MD Inpatient   07/07/2017 13:29 07/07/2017 13:35 Full Code [id number]  Idelle Crouch, MD Inpatient   06/13/2016 05:31 06/22/2016 15:14 DNR [id number]  Saundra Shelling, MD Inpatient   05/29/2016 08:18 05/29/2016 17:01 DNR [id number]  Merlyn Lot, MD ED    Advance Directive Documentation     Most Recent Value  Type of Advance Directive  Healthcare Power of Attorney  Pre-existing out of facility DNR order (yellow form or pink MOST form)  Yellow  form placed in chart (order not valid for inpatient use)  "MOST" Form in Place?  No data      TOTAL TIME TAKING CARE OF THIS PATIENT: 38 minutes.    Note: This dictation was prepared with Dragon dictation along with smaller phrase technology. Any transcriptional errors that result from this process are unintentional.  Gerren Hoffmeier M.D on 10/11/2017 at 10:37 AM  Between 7am to 6pm - Pager - [phone] After 6pm go to [email or web address] - password [email or web address] [place] [facility]  Office  [phone]  CC: Primary care physician; Maryland Pink, MD

## 2017-10-11 NOTE — NC FL2 (Signed)
[place] LEVEL OF CARE SCREENING TOOL     IDENTIFICATION  Patient Name: Victor Parks Birthdate: [birth year] Sex: male Admission Date (Current Location): 10/09/2017  [place] and [place] Number:  Engineering geologist and Address:  [facility], [address], [place], [place] [address]      Provider Number: [id number]  Attending Physician Name and Address:  Bettey Costa, MD  Relative Name and Phone Number:       Current Level of Care: Hospital Recommended Level of Care: [place] Prior Approval Number:    Date Approved/Denied:   PASRR Number:    Discharge Plan: SNF    Current Diagnoses: Patient Active Problem List   Diagnosis Date Noted  . UGIB (upper gastrointestinal bleed)   . GI bleed 09/14/2017  . HTN (hypertension) 09/14/2017  . Closed left hip fracture ([place]) 07/07/2017  . Hypotension 07/07/2017  . 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 Closed displaced intertrochanteric fracture of left femur ([place])   . Severe sepsis with septic shock ([place]) 06/17/2016  . Acute blood loss anemia 06/17/2016  . Septic shock ([place]) 06/16/2016  . Protein-calorie malnutrition, severe 06/15/2016  . Abdominal pain   . Dyspnea 06/13/2016  . Pressure injury of skin 06/13/2016    Orientation RESPIRATION BLADDER Height & Weight     Self  Normal Urostomy Weight: 149 lb (67.6 kg) Height:  5\' 10"  (177.8 cm)  BEHAVIORAL SYMPTOMS/MOOD NEUROLOGICAL BOWEL NUTRITION STATUS  (none) (none) Continent Diet(regular)  AMBULATORY STATUS COMMUNICATION OF NEEDS Skin   Limited Assist Verbally Normal                       Personal Care Assistance Level of Assistance  Bathing, Feeding, Dressing Bathing Assistance: Limited assistance Feeding assistance: Limited assistance Dressing Assistance: Limited assistance     Functional Limitations Info  (no issues reported)          SPECIAL CARE FACTORS FREQUENCY  PT (By licensed PT)([facility])                     Contractures Contractures Info: Not present    Additional Factors Info  Code Status Code Status Info: dnr             Current Medications (10/11/2017):  This is the current hospital active medication list Current Facility-Administered Medications  Medication Dose Route Frequency Provider Last Rate Last Dose  . 0.9 %  sodium chloride infusion   Intravenous Continuous Saundra Shelling, MD 75 mL/hr at 10/11/17 0355    . acetaminophen (TYLENOL) tablet 650 mg  650 mg Oral Q6H PRN Saundra Shelling, MD       Or  . acetaminophen (TYLENOL) suppository 650 mg  650 mg Rectal Q6H PRN Pyreddy, Reatha Harps, MD      . cefTRIAXone (ROCEPHIN) 1 g in sodium chloride 0.9 % 100 mL IVPB  1 g Intravenous Q24H Saundra Shelling, MD   Stopped at 10/11/17 1228  . ondansetron (ZOFRAN) tablet 4 mg  4 mg Oral Q6H PRN Pyreddy, Reatha Harps, MD       Or  . ondansetron (ZOFRAN) injection 4 mg  4 mg Intravenous Q6H PRN Pyreddy, [place], MD      . pantoprazole (PROTONIX) 80 mg in sodium chloride 0.9 % 250 mL (0.32 mg/mL) infusion  8 mg/hr Intravenous Continuous Alfred Levins, [place], MD 25 mL/hr at 10/11/17 0355 8 mg/hr at 10/11/17 0355  . [START ON 10/13/2017] pantoprazole (PROTONIX) injection 40 mg  40 mg  Intravenous Q12H Rudene Re, MD         Discharge Medications: Please see discharge summary for a list of discharge medications.  Relevant Imaging Results:  Relevant Lab Results:   Additional Information    Shela Leff, LCSW

## 2017-10-11 NOTE — Care Management Important Message (Signed)
Important Message  Patient Details  Name: Victor Parks MRN: [id number] Date of Birth: [birth year]   Medicare Important Message Given:  N/A - LOS <3 / Initial given by admissions    Beverly Sessions, RN 10/11/2017, 10:41 AM

## 2017-10-11 NOTE — Clinical Social Work Note (Signed)
Victor Parks from [facility] stated that family has worked out arrangements for patient's grandson to come pick him up for transport back to [facility] today. Shela Leff MSW,LcSW [phone]

## 2017-10-11 NOTE — Progress Notes (Signed)
Victor Parks  A and O x 4. VSS. Pt tolerating diet well. No complaints of pain or nausea. IV removed intact. Discharge instructions send with EMS. Pt discharged via EMS to [facility]. Called pt's daughter to let her know pt was on his way to [facility].      Allergies as of 10/11/2017      Reactions   Ciprofloxacin Anaphylaxis      Medication List    TAKE these medications   acetaminophen 500 MG tablet Commonly known as:  TYLENOL Take 1,000 mg by mouth 3 (three) times daily. 8 am, 2 pm, 8 pm   CALCIUM 1000 + D 1000-800 MG-UNIT Tabs Generic drug:  Calcium Carb-Cholecalciferol Take 1 tablet by mouth daily.   CALMOSEPTINE 0.44-20.6 % Oint Generic drug:  Menthol-Zinc Oxide Apply 1 application topically every 12 (twelve) hours as needed. To buttocks for rash   cephALEXin 500 MG capsule Commonly known as:  KEFLEX Take 1 capsule (500 mg total) by mouth 2 (two) times daily for 4 days.   DAILY VITE PO Take 1 tablet by mouth daily.   docusate sodium 100 MG capsule Commonly known as:  COLACE Take 1 capsule (100 mg total) by mouth 2 (two) times daily.   ferrous sulfate 325 (65 FE) MG tablet Take 325 mg by mouth daily with breakfast.   HYDROcodone-acetaminophen 5-325 MG tablet Commonly known as:  NORCO/VICODIN Take 1 tablet by mouth every 6 (six) hours as needed for moderate pain.   pantoprazole 40 MG tablet Commonly known as:  PROTONIX Take 1 tablet (40 mg total) by mouth 2 (two) times daily.   potassium citrate 10 MEQ (1080 MG) SR tablet Commonly known as:  UROCIT-K Take 10 mEq by mouth 2 (two) times daily.   simethicone 80 MG chewable tablet Commonly known as:  MYLICON Chew 80 mg by mouth 4 (four) times daily.   traMADol 50 MG tablet Commonly known as:  ULTRAM Take 1 tablet (50 mg total) by mouth 3 (three) times daily as needed.            Durable Medical Equipment  (From admission, onward)        Start     Ordered   10/11/17 1038  For home use only DME  Gilford Rile  [facility])  Once    Question:  Patient needs a walker to treat with the following condition  Answer:  Weakness   10/11/17 1039      Vitals:   10/11/17 0500 10/11/17 1351  BP: (!) 99/38 (!) 143/38  Pulse: 64 (!) 59  Resp: 19   Temp: 98.9 F (37.2 C)   SpO2: 99%     Francesco Sor

## 2017-10-11 NOTE — Evaluation (Signed)
Physical Therapy Evaluation Patient Details Name: Victor Parks MRN: [id number] DOB: 11-Apr-1924 Today's Date: 10/11/2017   History of Present Illness  presented to ER with coffee-ground emesis; admitted for management of upper GIB.  Current HgB 9.1.  Per GI, high risk for endoscopic procedure; managing conservatively at this time.  Clinical Impression  Upon evaluation, patient alert and oriented to self only; generally confused and unaware of situation, safety needs, but pleasant and cooperative.  Follows simple commands with increased encouragement throughout session.  Patient demonstrates ability to complete bed mobility, sit/stand, basic transfers and gait (40') with RW, min assist.  Limited lumbar and pelvic mobility noted; limited balance reactions.  Do recommend continued use of RW and +1 assist with all mobility. Would benefit from skilled PT to address above deficits and promote optimal return to PLOF; Recommend transition to [facility] upon discharge from acute hospitalization.     Follow Up Recommendations Home health PT    Equipment Recommendations  Rolling walker with 5" wheels    Recommendations for Other Services       Precautions / Restrictions Precautions Precautions: Fall Restrictions Weight Bearing Restrictions: No      Mobility  Bed Mobility Overal bed mobility: Needs Assistance Bed Mobility: Supine to Sit     Supine to sit: Min assist     General bed mobility comments: assist for truncal elevation  Transfers Overall transfer level: Needs assistance Equipment used: Rolling walker (2 wheeled) Transfers: Sit to/from Stand Sit to Stand: Min assist         General transfer comment: very limited lumbar extension and anterior pelvic tilt noted; assisist for forward weight shift and lift off from seating surfaces  Ambulation/Gait Ambulation/Gait assistance: Min assist Ambulation Distance (Feet): 40 Feet Assistive device: Rolling walker (2 wheeled)        General Gait Details: forward flexed posture; shuffling gait performance.  Cuing for postural extension and walker position throughout.  Broad turning radius, limited balance reactions.  Recommend continued use of RW and +1 with all mobiltity.  Stairs            Wheelchair Mobility    Modified Rankin (Stroke Patients Only)       Balance Overall balance assessment: Needs assistance Sitting-balance support: No upper extremity supported;Feet supported Sitting balance-Leahy Scale: Good     Standing balance support: Bilateral upper extremity supported Standing balance-Leahy Scale: Fair                               Pertinent Vitals/Pain Pain Assessment: Faces Faces Pain Scale: No hurt    Home Living Family/patient expects to be discharged to:: Assisted living                 Additional Comments: Per chart pt is a resident at [facility].  Pt reports he sometimes uses RW but unable to provide information on home layout, PLOF, or equipment.     Prior Function Level of Independence: Needs assistance         Comments: Patient confused, poor historian; unable to provide accurate history.  Previous notes suggest use of RW and some (min assist?) from staff as needed.     Hand Dominance        Extremity/Trunk Assessment   Upper Extremity Assessment Upper Extremity Assessment: Overall WFL for tasks assessed    Lower Extremity Assessment Lower Extremity Assessment: Overall WFL for tasks assessed(grossly at least 4-/5 as noted with functional  activities; limited ability to participate wtih formal MMT due to cognitive deficits)       Communication   Communication: HOH  Cognition Arousal/Alertness: Awake/alert Behavior During Therapy: WFL for tasks assessed/performed Overall Cognitive Status: No family/caregiver present to determine baseline cognitive functioning                                 General Comments: oriented to self  only; pleasant and cooperative, follows commands with increased encoruagement      General Comments      Exercises     Assessment/Plan    PT Assessment Patient needs continued PT services  PT Problem List Decreased strength;Decreased range of motion;Decreased balance;Decreased activity tolerance;Decreased mobility;Decreased cognition;Decreased knowledge of use of DME;Decreased safety awareness;Decreased knowledge of precautions       PT Treatment Interventions DME instruction;Gait training;Functional mobility training;Therapeutic activities;Therapeutic exercise;Balance training;Cognitive remediation;Patient/family education    PT Goals (Current goals can be found in the Care Plan section)  Acute Rehab PT Goals Patient Stated Goal: to take a nap PT Goal Formulation: With patient Time For Goal Achievement: 10/25/17 Potential to Achieve Goals: Fair    Frequency Min 2X/week   Barriers to discharge Decreased caregiver support      Co-evaluation               AM-PAC PT "6 Clicks" Daily Activity  Outcome Measure Difficulty turning over in bed (including adjusting bedclothes, sheets and blankets)?: Unable Difficulty moving from lying on back to sitting on the side of the bed? : Unable Difficulty sitting down on and standing up from a chair with arms (e.g., wheelchair, bedside commode, etc,.)?: Unable Help needed moving to and from a bed to chair (including a wheelchair)?: A Little Help needed walking in hospital room?: A Little Help needed climbing 3-5 steps with a railing? : A Lot 6 Click Score: 11    End of Session Equipment Utilized During Treatment: Gait belt Activity Tolerance: Patient limited by fatigue Patient left: in bed;with call bell/phone within reach;with bed alarm set Nurse Communication: Mobility status PT Visit Diagnosis: Muscle weakness (generalized) (M62.81);Difficulty in walking, not elsewhere classified (R26.2)    Time: 1761-6073 PT Time  Calculation (min) (ACUTE ONLY): 15 min   Charges:   PT Evaluation $PT Eval Low Complexity: 1 Low     PT G Codes:        Jaelyne Deeg H. Owens Shark, PT, DPT, NCS 10/11/17, 11:27 AM [phone]

## 2017-10-11 NOTE — Care Management Note (Signed)
Case Management Note  Patient Details  Name: Victor Parks MRN: [id number] Date of Birth: Apr 17, 1924   Patient to return to [facility] today.  Patient open with [facility] health.  Sarah with [facility] aware of discharge.  RNCM signing off.   Subjective/Objective:                    Action/Plan:   Expected Discharge Date:  10/11/17               Expected Discharge Plan:  Assisted Living / Rest Home(with home health )  In-House Referral:     Discharge planning Services     Post Acute Care Choice:  Resumption of Svcs/PTA Provider Choice offered to:     DME Arranged:    DME Agency:     HH Arranged:  RN, PT, OT HH Agency:  [facility]  Status of Service:  Completed, signed off  If discussed at [place] of Stay Meetings, dates discussed:    Additional Comments:  Beverly Sessions, RN 10/11/2017, 1:36 PM

## 2017-10-18 ENCOUNTER — Observation Stay
Admission: EM | Admit: 2017-10-18 | Discharge: 2017-10-19 | Disposition: A | Discharge status: Skilled Nursing Facility | Payer: Medicare Other | Attending: Internal Medicine | Admitting: Internal Medicine

## 2017-10-18 ENCOUNTER — Encounter: Payer: Self-pay | Admitting: Emergency Medicine

## 2017-10-18 ENCOUNTER — Other Ambulatory Visit: Payer: Self-pay

## 2017-10-18 DIAGNOSIS — Z66 Do not resuscitate: Secondary | ICD-10-CM | POA: Diagnosis not present

## 2017-10-18 DIAGNOSIS — N390 Urinary tract infection, site not specified: Secondary | ICD-10-CM | POA: Diagnosis not present

## 2017-10-18 DIAGNOSIS — K529 Noninfective gastroenteritis and colitis, unspecified: Secondary | ICD-10-CM | POA: Diagnosis not present

## 2017-10-18 DIAGNOSIS — I10 Essential (primary) hypertension: Secondary | ICD-10-CM | POA: Diagnosis not present

## 2017-10-18 DIAGNOSIS — Z87891 Personal history of nicotine dependence: Secondary | ICD-10-CM | POA: Insufficient documentation

## 2017-10-18 DIAGNOSIS — F0280 Dementia in other diseases classified elsewhere without behavioral disturbance: Secondary | ICD-10-CM | POA: Diagnosis not present

## 2017-10-18 DIAGNOSIS — E860 Dehydration: Secondary | ICD-10-CM | POA: Diagnosis not present

## 2017-10-18 DIAGNOSIS — N179 Acute kidney failure, unspecified: Secondary | ICD-10-CM | POA: Diagnosis not present

## 2017-10-18 DIAGNOSIS — Z79899 Other long term (current) drug therapy: Secondary | ICD-10-CM | POA: Diagnosis not present

## 2017-10-18 DIAGNOSIS — A09 Infectious gastroenteritis and colitis, unspecified: Secondary | ICD-10-CM

## 2017-10-18 DIAGNOSIS — G309 Alzheimer's disease, unspecified: Secondary | ICD-10-CM | POA: Diagnosis not present

## 2017-10-18 DIAGNOSIS — R531 Weakness: Secondary | ICD-10-CM | POA: Diagnosis present

## 2017-10-18 DIAGNOSIS — Z8551 Personal history of malignant neoplasm of bladder: Secondary | ICD-10-CM | POA: Insufficient documentation

## 2017-10-18 LAB — C DIFFICILE QUICK SCREEN W PCR REFLEX
C DIFFICILE (CDIFF) TOXIN: NEGATIVE
C DIFFICLE (CDIFF) ANTIGEN: NEGATIVE
C Diff interpretation: NOT DETECTED

## 2017-10-18 LAB — COMPREHENSIVE METABOLIC PANEL
ALK PHOS: 75 U/L (ref 38–126)
ALT: 10 U/L — AB (ref 17–63)
AST: 22 U/L (ref 15–41)
Albumin: 3.7 g/dL (ref 3.5–5.0)
Anion gap: 11 (ref 5–15)
BUN: 25 mg/dL — AB (ref 6–20)
CALCIUM: 8.7 mg/dL — AB (ref 8.9–10.3)
CHLORIDE: 105 mmol/L (ref 101–111)
CO2: 23 mmol/L (ref 22–32)
Creatinine, Ser: 1.27 mg/dL — ABNORMAL HIGH (ref 0.61–1.24)
GFR calc non Af Amer: 48 mL/min — ABNORMAL LOW (ref >60)
GFR, EST AFRICAN AMERICAN: 55 mL/min — AB (ref >60)
GLUCOSE: 160 mg/dL — AB (ref 65–99)
Potassium: 3.6 mmol/L (ref 3.5–5.1)
SODIUM: 139 mmol/L (ref 135–145)
Total Bilirubin: 0.4 mg/dL (ref 0.3–1.2)
Total Protein: 6.8 g/dL (ref 6.5–8.1)

## 2017-10-18 LAB — GASTROINTESTINAL PANEL BY PCR, STOOL (REPLACES STOOL CULTURE)

## 2017-10-18 LAB — URINALYSIS, COMPLETE (UACMP) WITH MICROSCOPIC
BACTERIA UA: NONE SEEN
BILIRUBIN URINE: NEGATIVE
Glucose, UA: NEGATIVE mg/dL
Ketones, ur: NEGATIVE mg/dL
Leukocytes, UA: NEGATIVE
Nitrite: NEGATIVE
PH: 5 (ref 5.0–8.0)
Protein, ur: NEGATIVE mg/dL
SPECIFIC GRAVITY, URINE: 1.012 (ref 1.005–1.030)
SQUAMOUS EPITHELIAL / LPF: NONE SEEN

## 2017-10-18 LAB — CBC WITH DIFFERENTIAL/PLATELET
BASOS PCT: 0 %
Basophils Absolute: 0.1 10*3/uL (ref 0–0.1)
EOS ABS: 0.1 10*3/uL (ref 0–0.7)
EOS PCT: 1 %
HCT: 37.6 % — ABNORMAL LOW (ref 40.0–52.0)
HEMOGLOBIN: 12.2 g/dL — AB (ref 13.0–18.0)
LYMPHS ABS: 1.8 10*3/uL (ref 1.0–3.6)
Lymphocytes Relative: 11 %
MCH: 30.5 pg (ref 26.0–34.0)
MCHC: 32.5 g/dL (ref 32.0–36.0)
MCV: 93.8 fL (ref 80.0–100.0)
MONO ABS: 0.7 10*3/uL (ref 0.2–1.0)
Monocytes Relative: 4 %
NEUTROS PCT: 84 %
Neutro Abs: 13.8 10*3/uL — ABNORMAL HIGH (ref 1.4–6.5)
Platelets: 282 10*3/uL (ref 150–440)
RBC: 4.01 MIL/uL — ABNORMAL LOW (ref 4.40–5.90)
RDW: 14.9 % — AB (ref 11.5–14.5)
WBC: 16.5 10*3/uL — ABNORMAL HIGH (ref 3.8–10.6)

## 2017-10-18 LAB — TROPONIN I: Troponin I: <0.03 ng/mL (ref <0.03)

## 2017-10-18 LAB — TSH: TSH: 1.469 u[IU]/mL (ref 0.350–4.500)

## 2017-10-18 LAB — TYPE AND SCREEN
ABO/RH(D): A POS
Antibody Screen: NEGATIVE

## 2017-10-18 LAB — PROTIME-INR
INR: 1.11
PROTHROMBIN TIME: 14.2 s (ref 11.4–15.2)

## 2017-10-18 MED ORDER — ONDANSETRON HCL 4 MG/2ML IJ SOLN: 4.0000 mg | Freq: Four times a day (QID) | INTRAMUSCULAR | Status: DC | PRN

## 2017-10-18 MED ORDER — FERROUS SULFATE 325 (65 FE) MG PO TABS
325.0000 mg | ORAL_TABLET | Freq: Every day | ORAL | Status: DC
  Administered 2017-10-19: 325 mg via ORAL
  Filled 2017-10-18 (×2): qty 1

## 2017-10-18 MED ORDER — SODIUM CHLORIDE 0.9 % IV SOLN
INTRAVENOUS | Status: DC
  Administered 2017-10-18 (×3): via INTRAVENOUS

## 2017-10-18 MED ORDER — ACETAMINOPHEN 650 MG RE SUPP: 650.0000 mg | Freq: Four times a day (QID) | RECTAL | Status: DC | PRN

## 2017-10-18 MED ORDER — ONDANSETRON HCL 4 MG PO TABS: 4.0000 mg | ORAL_TABLET | Freq: Four times a day (QID) | ORAL | Status: DC | PRN

## 2017-10-18 MED ORDER — PANTOPRAZOLE SODIUM 40 MG PO TBEC
40.0000 mg | DELAYED_RELEASE_TABLET | Freq: Two times a day (BID) | ORAL | Status: DC
  Administered 2017-10-19: 40 mg via ORAL
  Filled 2017-10-18 (×3): qty 1

## 2017-10-18 MED ORDER — HEPARIN SODIUM (PORCINE) 5000 UNIT/ML IJ SOLN
5000.0000 [IU] | Freq: Three times a day (TID) | INTRAMUSCULAR | Status: DC
  Administered 2017-10-18 – 2017-10-19 (×4): 5000 [IU] via SUBCUTANEOUS
  Filled 2017-10-18 (×3): qty 1

## 2017-10-18 MED ORDER — SODIUM CHLORIDE 0.9 % IV SOLN
1.0000 g | INTRAVENOUS | Status: DC
  Administered 2017-10-18: 1 g via INTRAVENOUS
  Filled 2017-10-18 (×2): qty 10

## 2017-10-18 MED ORDER — LOPERAMIDE HCL 2 MG PO CAPS: 2.0000 mg | ORAL_CAPSULE | Freq: Four times a day (QID) | ORAL | Status: DC | PRN

## 2017-10-18 MED ORDER — SIMETHICONE 80 MG PO CHEW
80.0000 mg | CHEWABLE_TABLET | Freq: Four times a day (QID) | ORAL | Status: DC
  Administered 2017-10-18 – 2017-10-19 (×3): 80 mg via ORAL
  Filled 2017-10-18 (×8): qty 1

## 2017-10-18 MED ORDER — HYDROCODONE-ACETAMINOPHEN 5-325 MG PO TABS: 1.0000 | ORAL_TABLET | Freq: Four times a day (QID) | ORAL | Status: DC | PRN

## 2017-10-18 MED ORDER — POTASSIUM CITRATE ER 10 MEQ (1080 MG) PO TBCR
10.0000 meq | EXTENDED_RELEASE_TABLET | Freq: Two times a day (BID) | ORAL | Status: DC
  Administered 2017-10-18 – 2017-10-19 (×2): 10 meq via ORAL
  Filled 2017-10-18 (×4): qty 1

## 2017-10-18 MED ORDER — MENTHOL-ZINC OXIDE 0.44-20.6 % EX OINT
1.0000 "application " | TOPICAL_OINTMENT | Freq: Two times a day (BID) | CUTANEOUS | Status: DC | PRN
  Filled 2017-10-18: qty 1

## 2017-10-18 MED ORDER — SODIUM CHLORIDE 0.9 % IV BOLUS (SEPSIS): 1000.0000 mL | Freq: Once | INTRAVENOUS | Status: DC

## 2017-10-18 MED ORDER — CALCIUM CARBONATE-VITAMIN D 500-200 MG-UNIT PO TABS
2.0000 | ORAL_TABLET | Freq: Every day | ORAL | Status: DC
  Administered 2017-10-19: 08:00:00 2 via ORAL
  Filled 2017-10-18 (×2): qty 2

## 2017-10-18 MED ORDER — ACETAMINOPHEN 325 MG PO TABS: 650.0000 mg | ORAL_TABLET | Freq: Four times a day (QID) | ORAL | Status: DC | PRN

## 2017-10-18 NOTE — ED Notes (Signed)
Moderate amount of soft green stool noted in pt depends. Cleaned pt and provided for his comfort and safety. Will continue to assess.

## 2017-10-18 NOTE — ED Triage Notes (Addendum)
Pt arrived via [facility] from [facility] with c/o weakness. Pt was seen in this ED on 3/13 admitted and treated in this hospital for coffee ground emesis. D/c from hospital on 3/15. Pt was assisted to toilet at facility this evening and became weak and pale. Pt denies pain. Hx of dementia.

## 2017-10-18 NOTE — ED Provider Notes (Addendum)
[facility] Emergency Department Provider Note __________   First MD Initiated Contact with Patient 10/18/17 0121     (approximate)  I have reviewed the triage vital signs and the nursing notes.  Level 5 caveat: History and physical exam limited secondary to Alzheimer's dementia.  History primarily obtained from EMS HISTORY  Chief Complaint No chief complaint on file.    HPI Victor Parks is a 84 y.o. male with below list of chronic medical conditions including recent admission for GI bleed return to the emergency department via EMS secondary to generalized weakness pale and diaphoresis.  EMS states the patient has had diarrhea while in route to the emergency department.   Past Medical History:  Diagnosis Date  . Bladder cancer ([place])   . Decubitus ulcer   . Hypertension   . Muscle weakness   . UTI (urinary tract infection)     Patient Active Problem List   Diagnosis Date Noted  . UGIB (upper gastrointestinal bleed)   . GI bleed 09/14/2017  . HTN (hypertension) 09/14/2017  . Closed left hip fracture ([place]) 07/07/2017  . Hypotension 07/07/2017  . 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 Closed displaced intertrochanteric fracture of left femur ([place])   . Severe sepsis with septic shock ([place]) 06/17/2016  . Acute blood loss anemia 06/17/2016  . Septic shock ([place]) 06/16/2016  . Protein-calorie malnutrition, severe 06/15/2016  . Abdominal pain   . Dyspnea 06/13/2016  . Pressure injury of skin 06/13/2016    Past Surgical History:  Procedure Laterality Date  . ESOPHAGOGASTRODUODENOSCOPY (EGD) WITH PROPOFOL N/A 06/20/2016   Procedure: ESOPHAGOGASTRODUODENOSCOPY (EGD) WITH PROPOFOL;  Surgeon: Jonathon Bellows, MD;  Location: [facility] ENDOSCOPY;  Service: Endoscopy;  Laterality: N/A;  . FEMUR IM NAIL Left 07/07/2017   Procedure: INTRAMEDULLARY (IM) NAIL FEMORAL;  Surgeon: Stark Bray, MD;  Location: [facility] ORS;  Service: Orthopedics;  Laterality: Left;  . ILEAL POUCH      s/p bladder cancer  . none      Prior to Admission medications   Medication Sig Start Date End Date Taking? Authorizing Provider  acetaminophen (TYLENOL) 500 MG tablet Take 1,000 mg by mouth 3 (three) times daily. 8 am, 2 pm, 8 pm    [provider]  Calcium Carb-Cholecalciferol (CALCIUM 1000 + D) 1000-800 MG-UNIT TABS Take 1 tablet by mouth daily.    [provider]  docusate sodium (COLACE) 100 MG capsule Take 1 capsule (100 mg total) by mouth 2 (two) times daily. 07/10/17   Loletha Grayer, MD  ferrous sulfate 325 (65 FE) MG tablet Take 325 mg by mouth daily with breakfast.    [provider]  HYDROcodone-acetaminophen (NORCO/VICODIN) 5-325 MG tablet Take 1 tablet by mouth every 6 (six) hours as needed for moderate pain. 10/11/17   Bettey Costa, MD  Menthol-Zinc Oxide (CALMOSEPTINE) 0.44-20.6 % OINT Apply 1 application topically every 12 (twelve) hours as needed. To buttocks for rash    [provider]  Multiple Vitamin (DAILY VITE PO) Take 1 tablet by mouth daily.    [provider]  pantoprazole (PROTONIX) 40 MG tablet Take 1 tablet (40 mg total) by mouth 2 (two) times daily. 07/10/17   Loletha Grayer, MD  potassium citrate (UROCIT-K) 10 MEQ (1080 MG) SR tablet Take 10 mEq by mouth 2 (two) times daily.    [provider]  simethicone (MYLICON) 80 MG chewable tablet Chew 80 mg by mouth 4 (four) times daily.     [provider]  traMADol (  ULTRAM) 50 MG tablet Take 1 tablet (50 mg total) by mouth 3 (three) times daily as needed. 10/11/17   Bettey Costa, MD    Allergies Ciprofloxacin  No family history on file.  Social History Social History   Tobacco Use  . Smoking status: Former Research scientist (life sciences)  . Smokeless tobacco: Never Used  Substance Use Topics  . Alcohol use: No  . Drug use: No    Review of Systems Constitutional: No fever/chills Eyes: No visual changes. ENT: No sore throat. Cardiovascular: Denies chest  pain. Respiratory: Denies shortness of breath. Gastrointestinal: No abdominal pain.  No nausea, no vomiting.  Positive for diarrhea Genitourinary: Negative for dysuria. Musculoskeletal: Negative for neck pain.  Negative for back pain.   Integumentary: Negative for rash.  Positive for skin pallor Neurological: Positive for generalized weakness ____________________________________________   PHYSICAL EXAM:  VITAL SIGNS: ED Triage Vitals  Enc Vitals Group     BP      Pulse      Resp      Temp      Temp src      SpO2      Weight      Height      Head Circumference      Peak Flow      Pain Score      Pain Loc      Pain Edu?      Excl. in [place]?     Constitutional: Alert and . Well appearing and in no acute distress. Eyes: Conjunctivae are pale Head: Atraumatic. Mouth/Throat: Mucous membranes are pale oropharynx non-erythematous. Neck: No stridor.   Cardiovascular: Normal rate, regular rhythm. Good peripheral circulation. Grossly normal heart sounds. Respiratory: Normal respiratory effort.  No retractions. Lungs CTAB. Gastrointestinal: Soft and nontender. No distention.  Profuse malodorous diarrhea Musculoskeletal: No lower extremity tenderness nor edema. No gross deformities of extremities. Neurologic:  No gross focal neurologic deficits are appreciated.  Skin:  Skin is pale warm, dry and intact. No rash noted. Psychiatric: Mood and affect are normal. Speech and behavior are normal.  ____________________________________________   LABS (all labs ordered are listed, but only abnormal results are displayed)  Labs Reviewed  COMPREHENSIVE METABOLIC PANEL  CBC WITH DIFFERENTIAL/PLATELET  PROTIME-INR  TYPE AND SCREEN   :   Procedures   ____________________________________________   INITIAL IMPRESSION / ASSESSMENT AND PLAN / ED COURSE  As part of my medical decision making, I reviewed the following data within the electronic MEDICAL RECORD NUMBER   84 year old male  present with above-stated history and physical exam of diarrhea with generalized weakness.  Suspect infectious etiology less likely to be a GI bleed as melena is not noted at this time.  Stool cultures pending.  Patient given 1 L IV normal saline the emergency department.  Patient discussed with Dr. Marcille Blanco for hospital admission for further evaluation and management of possible infectious diarrhea with dehydration    ____________________________________________  FINAL CLINICAL IMPRESSION(S) / ED DIAGNOSES  Final diagnoses:  Diarrhea of infectious origin  Dehydration     MEDICATIONS GIVEN DURING THIS VISIT:  Medications - No data to display   ED Discharge Orders    None       Note:  This document was prepared using Dragon voice recognition software and may include unintentional dictation errors.    Gregor Hams, MD 10/18/17 5009    Gregor Hams, MD 10/18/17 [phone]

## 2017-10-18 NOTE — ED Notes (Signed)
Pt had small amount of stool noted in his depends. Cleaned pt and prepared him for transport. Tolerated well. No distress noted. Denies pain.

## 2017-10-18 NOTE — ED Notes (Signed)
Foul odor coming from pt room. Large amount of soft green stool noted seeping out of pt depends. Cleaned pt and changed his sheets and depends. MD aware. Provided for pt safety and comfort. Will continue to assess.

## 2017-10-18 NOTE — H&P (Signed)
Victor Parks is an 84 y.o. male.   Chief Complaint: Weakness HPI: Patient with past medical history of dementia, hypertension, bladder cancer and recurrent urinary tract infection presents to the emergency department from his nursing home with apparent weakness.  Patient cannot contribute much to his history but he denies pain.  The patient reportedly had not been acting himself and had developed diarrhea.  In the emergency department laboratory evaluation revealed leukocytosis as well as acute kidney injury which prompted the emergency department staff to call the hospitalist service for admission.  Past Medical History:  Diagnosis Date  . Bladder cancer ([place])   . Decubitus ulcer   . Hypertension   . Muscle weakness   . UTI (urinary tract infection)     Past Surgical History:  Procedure Laterality Date  . ESOPHAGOGASTRODUODENOSCOPY (EGD) WITH PROPOFOL N/A 06/20/2016   Procedure: ESOPHAGOGASTRODUODENOSCOPY (EGD) WITH PROPOFOL;  Surgeon: Jonathon Bellows, MD;  Location: [facility] ENDOSCOPY;  Service: Endoscopy;  Laterality: N/A;  . FEMUR IM NAIL Left 07/07/2017   Procedure: INTRAMEDULLARY (IM) NAIL FEMORAL;  Surgeon: Stark Bray, MD;  Location: [facility] ORS;  Service: Orthopedics;  Laterality: Left;  . ILEAL POUCH     s/p bladder cancer  . none      No family history on file.  Patient cannot contribute to his history due to dementia Social History:  reports that he has quit smoking. He has never used smokeless tobacco. He reports that he does not drink alcohol or use drugs.  Allergies:  Allergies  Allergen Reactions  . Ciprofloxacin Anaphylaxis    Medications Prior to Admission  Medication Sig Dispense Refill  . acetaminophen (TYLENOL) 500 MG tablet Take 1,000 mg by mouth 3 (three) times daily. 8 am, 2 pm, 8 pm    . Calcium Carb-Cholecalciferol (CALCIUM 1000 + D) 1000-800 MG-UNIT TABS Take 1 tablet by mouth daily.    . cephALEXin (KEFLEX) 500 MG capsule Take 500 mg by mouth 2 (two) times  daily.    [name] docusate sodium (COLACE) 100 MG capsule Take 1 capsule (100 mg total) by mouth 2 (two) times daily. 60 capsule 0  . ferrous sulfate 325 (65 FE) MG tablet Take 325 mg by mouth daily with breakfast.    . HYDROcodone-acetaminophen (NORCO/VICODIN) 5-325 MG tablet Take 1 tablet by mouth every 6 (six) hours as needed for moderate pain. 15 tablet 0  . Menthol-Zinc Oxide (CALMOSEPTINE) 0.44-20.6 % OINT Apply 1 application topically every 12 (twelve) hours as needed. To buttocks for rash    . Multiple Vitamin (DAILY VITE PO) Take 1 tablet by mouth daily.    . pantoprazole (PROTONIX) 40 MG tablet Take 1 tablet (40 mg total) by mouth 2 (two) times daily. 60 tablet 0  . potassium citrate (UROCIT-K) 10 MEQ (1080 MG) SR tablet Take 10 mEq by mouth 2 (two) times daily.    . simethicone (MYLICON) 80 MG chewable tablet Chew 80 mg by mouth 4 (four) times daily.     . simethicone (MYLICON) 80 MG chewable tablet Chew 80 mg by mouth every 6 (six) hours as needed for flatulence.    . traMADol (ULTRAM) 50 MG tablet Take 1 tablet (50 mg total) by mouth 3 (three) times daily as needed. (Patient taking differently: Take 50 mg by mouth 3 (three) times daily as needed for moderate pain or severe pain. ) 20 tablet 0  . traMADol (ULTRAM) 50 MG tablet Take 50 mg by mouth 3 (three) times daily.  Results for orders placed or performed during the hospital encounter of 10/18/17 (from the past 48 hour(s))  Comprehensive metabolic panel     Status: Abnormal   Collection Time: 10/18/17  1:56 AM  Result Value Ref Range   Sodium 139 135 - 145 mmol/L   Potassium 3.6 3.5 - 5.1 mmol/L   Chloride 105 101 - 111 mmol/L   CO2 23 22 - 32 mmol/L   Glucose, Bld 160 (H) 65 - 99 mg/dL   BUN 25 (H) 6 - 20 mg/dL   Creatinine, Ser 1.27 (H) 0.61 - 1.24 mg/dL   Calcium 8.7 (L) 8.9 - 10.3 mg/dL   Total Protein 6.8 6.5 - 8.1 g/dL   Albumin 3.7 3.5 - 5.0 g/dL   AST 22 15 - 41 U/L   ALT 10 (L) 17 - 63 U/L   Alkaline Phosphatase 75  38 - 126 U/L   Total Bilirubin 0.4 0.3 - 1.2 mg/dL   GFR calc non Af Amer 48 (L) >60 mL/min   GFR calc Af Amer 55 (L) >60 mL/min    Comment: (NOTE) The eGFR has been calculated using the CKD EPI equation. This calculation has not been validated in all clinical situations. eGFR's persistently <60 mL/min signify possible Chronic Kidney Disease.    Anion gap 11 5 - 15    Comment: Performed at [facility], [address] [place], [place] [address]  CBC WITH DIFFERENTIAL     Status: Abnormal   Collection Time: 10/18/17  1:56 AM  Result Value Ref Range   WBC 16.5 (H) 3.8 - 10.6 K/uL   RBC 4.01 (L) 4.40 - 5.90 MIL/uL   Hemoglobin 12.2 (L) 13.0 - 18.0 g/dL   HCT 37.6 (L) 40.0 - 52.0 %   MCV 93.8 80.0 - 100.0 fL   MCH 30.5 26.0 - 34.0 pg   MCHC 32.5 32.0 - 36.0 g/dL   RDW 14.9 (H) 11.5 - 14.5 %   Platelets 282 150 - 440 K/uL   Neutrophils Relative % 84 %   Neutro Abs 13.8 (H) 1.4 - 6.5 K/uL   Lymphocytes Relative 11 %   Lymphs Abs 1.8 1.0 - 3.6 K/uL   Monocytes Relative 4 %   Monocytes Absolute 0.7 0.2 - 1.0 K/uL   Eosinophils Relative 1 %   Eosinophils Absolute 0.1 0 - 0.7 K/uL   Basophils Relative 0 %   Basophils Absolute 0.1 0 - 0.1 K/uL    Comment: Performed at [facility], [address] [place], [place] [address]  Protime-INR     Status: None   Collection Time: 10/18/17  1:56 AM  Result Value Ref Range   Prothrombin Time 14.2 11.4 - 15.2 seconds   INR 1.11     Comment: Performed at [facility], [address], [place], [place] [address]  Type and screen [place]     Status: None   Collection Time: 10/18/17  1:56 AM  Result Value Ref Range   ABO/RH(D) A POS    Antibody Screen NEG    Sample Expiration      10/21/2017 Performed at [facility], [address] [place], [place] [address]   Troponin I     Status: None   Collection Time: 10/18/17  1:56 AM  Result Value Ref Range   Troponin I <0.03 <0.03  ng/mL    Comment: Performed at [facility], [address], [place],  [address]  Gastrointestinal Panel by PCR ,  Stool     Status: None   Collection Time: 10/18/17  4:37 AM  Result Value Ref Range   Campylobacter species NOT DETECTED NOT DETECTED   Plesimonas shigelloides NOT DETECTED NOT DETECTED   Salmonella species NOT DETECTED NOT DETECTED   Yersinia enterocolitica NOT DETECTED NOT DETECTED   Vibrio species NOT DETECTED NOT DETECTED   Vibrio cholerae NOT DETECTED NOT DETECTED   Enteroaggregative E coli (EAEC) NOT DETECTED NOT DETECTED   Enteropathogenic E coli (EPEC) NOT DETECTED NOT DETECTED   Enterotoxigenic E coli (ETEC) NOT DETECTED NOT DETECTED   Shiga like toxin producing E coli (STEC) NOT DETECTED NOT DETECTED   Shigella/Enteroinvasive E coli (EIEC) NOT DETECTED NOT DETECTED   Cryptosporidium NOT DETECTED NOT DETECTED   Cyclospora cayetanensis NOT DETECTED NOT DETECTED   Entamoeba histolytica NOT DETECTED NOT DETECTED   Giardia lamblia NOT DETECTED NOT DETECTED   Adenovirus F40/41 NOT DETECTED NOT DETECTED   Astrovirus NOT DETECTED NOT DETECTED   Norovirus GI/GII NOT DETECTED NOT DETECTED   Rotavirus A NOT DETECTED NOT DETECTED   Sapovirus (I, II, IV, and V) NOT DETECTED NOT DETECTED    Comment: Performed at [facility], [address] [place], [place] [address]  C difficile quick scan w PCR reflex     Status: None   Collection Time: 10/18/17  4:37 AM  Result Value Ref Range   C Diff antigen NEGATIVE NEGATIVE   C Diff toxin NEGATIVE NEGATIVE   C Diff interpretation No C. difficile detected.     Comment: Performed at [facility], [address] [place], [place] [address]  TSH     Status: None   Collection Time: 10/18/17  6:11 AM  Result Value Ref Range   TSH 1.469 0.350 - 4.500 uIU/mL    Comment: Performed by a 3rd Generation assay with a functional sensitivity of <=0.01 uIU/mL. Performed at [facility], [address] [place], [place] [address]    No results found.  Review of Systems  Unable to perform ROS: Dementia  Gastrointestinal: Positive for diarrhea.    Blood pressure 123/60, pulse 88, temperature 98.5 F (36.9 C), resp. rate 19, height 5' 10"  (1.778 m), weight 65.3 kg (144 lb), SpO2 99 %. Physical Exam  Vitals reviewed. Constitutional: He is oriented to person, place, and time. He appears well-developed and well-nourished. No distress.  HENT:  Head: Normocephalic and atraumatic.  Mouth/Throat: Oropharynx is clear and moist.  Eyes: Pupils are equal, round, and reactive to light. Conjunctivae and EOM are normal. No scleral icterus.  Neck: Normal range of motion. Neck supple. No JVD present. No tracheal deviation present. No thyromegaly present.  Cardiovascular: Normal rate, regular rhythm and normal heart sounds. Exam reveals no gallop and no friction rub.  No murmur heard. Respiratory: Effort normal and breath sounds normal. No respiratory distress.  GI: Soft. Bowel sounds are normal. He exhibits no distension. There is no tenderness.  Genitourinary:  Genitourinary Comments: Deferred  Musculoskeletal: Normal range of motion. He exhibits no edema.  Lymphadenopathy:    He has no cervical adenopathy.  Neurological: He is alert and oriented to person, place, and time. No cranial nerve deficit.  Skin: Skin is warm and dry. No rash noted. No erythema.     Assessment/Plan This is a 84 year old male admitted for gastroenteritis. 1.  Gastroenteritis: Copious amounts of diarrhea.  GI panel and C. difficile assay pending.  Hydrate with intravenous fluid. 2.  Urinary tract infection: Recurrent; present on  admission.  At this time I have held the patient's daily antibiotic due to potential bacterial overgrowth.  If positive for C. difficile will add vancomycin which will likely cover urinary bacteria as the patient had been on cephalosporins before. 3.  Acute kidney injury: Mild; hydrate with  intravenous fluid.  Avoid nephrotoxic agents. 4.  DVT prophylaxis: Heparin 5.  GI prophylaxis: Patch resolved per home regimen The patient is a DNR.  Time spent on admission orders and patient care approximately 45 minutes  Harrie Foreman, MD 10/18/2017, 8:39 AM

## 2017-10-18 NOTE — Progress Notes (Signed)
Family Meeting Note  Advance Directive:yes  Today a meeting took place with the daughter.  Patient is unable to participate due KD:PTELMR capacity dementia   The following clinical team members were present during this meeting:MD  The following were discussed:Patient's diagnosis: dementia, loss of appetite, weakness, not walking, Patient's progosis: < 3 months and Goals for treatment: DNR  Additional follow-up to be provided: hospice  Time spent during discussion:20 minutes  Vaughan Basta, MD

## 2017-10-18 NOTE — Clinical Social Work Note (Signed)
Clinical Social Work Assessment  Patient Details  Name: Victor Parks MRN: [id number] Date of Birth: [birth year]  Date of referral:  10/18/17               Reason for consult:  Facility Placement                Permission sought to share information with:  Facility Sport and exercise psychologist, Family Supports Permission granted to share information::  Yes, Verbal Permission Granted  Name::     Brannan, Cassedy Daughter [phone] or Kemani, Demarais Daughter [phone]  [phone] or Ac, Colan Daughter [phone]  [phone]   Agency::  [facility]  Relationship::     Contact Information:     Housing/Transportation Living arrangements for the past 2 months:  [place] of Information:  Patient, Facility Patient Interpreter Needed:  None Criminal Activity/Legal Involvement Pertinent to Current Situation/Hospitalization:  No - Comment as needed Significant Relationships:  Adult Children Lives with:  Facility Resident Do you feel safe going back to the place where you live?  Yes Need for family participation in patient care:  Yes (Comment)  Care giving concerns:  Patient does not have any concerns about returning back to ALF.   Social Worker assessment / plan:  Patient is a 84 year old male who is alert and oriented x3.  Patient is from [facility], patient has been there for a couple of years.  Patient states he feels like he is taken care of well, and is looking forward to returning back to ALF.  Patient was informed about the process of going to ALF, and what the role of CSW is.  Patient did not express any other issues or concerns.  Employment status:  Retired Forensic scientist:  Medicare PT Recommendations:  Not assessed at this time Information / Referral to community resources:     Patient/Family's Response to care: Patient is in agreement of returning back to ALF.  Patient/Family's  Understanding of and Emotional Response to Diagnosis, Current Treatment, and Prognosis:  Patient is aware of current treatment plan and prognosis.  Emotional Assessment Appearance:  Appears stated age Attitude/Demeanor/Rapport:    Affect (typically observed):  Calm, Appropriate Orientation:  Oriented to Self, Oriented to Place, Oriented to Situation Alcohol / Substance use:  Not Applicable Psych involvement (Current and /or in the community):  No (Comment)  Discharge Needs  Concerns to be addressed:  Care Coordination Readmission within the last 30 days:  No Current discharge risk:  None Barriers to Discharge:  Continued Medical Work up   Anell Barr 10/18/2017, 7:12 PM

## 2017-10-18 NOTE — Care Management (Signed)
patient presents from [facility].  Currently receiving home health RN PT OT SW and outpatient palliative.  He is placed in observation for gastroenteritis

## 2017-10-18 NOTE — Care Management Obs Status (Signed)
[place] NOTIFICATION   Patient Details  Name: Victor Parks MRN: [id number] Date of Birth: 1924-07-27   Medicare Observation Status Notification Given:  Yes  Reviewed contents of the notice with daughter Vaughan Basta who will be on the unit later this morning.  Placed copy in room with contact information of CM   Katrina Stack, RN 10/18/2017, 10:28 AM

## 2017-10-18 NOTE — Progress Notes (Signed)
[place] at [place] NAME: Victor Parks    MR#:  [id number]  DATE OF BIRTH:  [birth year]  SUBJECTIVE:  CHIEF COMPLAINT:   Chief Complaint  Patient presents with  . Weakness    for last 3-4 month have worsening weakness and have loss of appetite now. Received Abx multiple times for UTI. Came with diarrhea, noted negative for infections. Still have significant diarrhea.  REVIEW OF SYSTEMS:   Pt have dementia , can not give ROS.  ROS  DRUG ALLERGIES:   Allergies  Allergen Reactions  . Ciprofloxacin Anaphylaxis    VITALS:  Blood pressure 131/60, pulse 91, temperature 99.1 F (37.3 C), temperature source Oral, resp. rate 18, height 5\' 10"  (1.778 m), weight 65.3 kg (144 lb), SpO2 90 %.  PHYSICAL EXAMINATION:  GENERAL:  84 y.o.-year-old patient lying in the bed with no acute distress.  EYES: Pupils equal, round, reactive to light and accommodation. No scleral icterus. Extraocular muscles intact.  HEENT: Head atraumatic, normocephalic. Oropharynx and nasopharynx clear.  NECK:  Supple, no jugular venous distention. No thyroid enlargement, no tenderness.  LUNGS: Normal breath sounds bilaterally, no wheezing, rales,rhonchi or crepitation. No use of accessory muscles of respiration.  CARDIOVASCULAR: S1, S2 normal. No murmurs, rubs, or gallops.  ABDOMEN: Soft, nontender, nondistended. Bowel sounds present. No organomegaly or mass.  EXTREMITIES: No pedal edema, cyanosis, or clubbing.  NEUROLOGIC: Cranial nerves II through XII are intact. Muscle strength 3/5 in all extremities. Sensation intact. Gait not checked.  PSYCHIATRIC: The patient is alert and oriented x 1.  SKIN: No obvious rash, lesion, or ulcer.   Physical Exam LABORATORY PANEL:   CBC Recent Labs  Lab 10/18/17 0156  WBC 16.5*  HGB 12.2*  HCT 37.6*  PLT 282    ------------------------------------------------------------------------------------------------------------------  Chemistries  Recent Labs  Lab 10/18/17 0156  NA 139  K 3.6  CL 105  CO2 23  GLUCOSE 160*  BUN 25*  CREATININE 1.27*  CALCIUM 8.7*  AST 22  ALT 10*  ALKPHOS 75  BILITOT 0.4   ------------------------------------------------------------------------------------------------------------------  Cardiac Enzymes Recent Labs  Lab 10/18/17 0156  TROPONINI <0.03   ------------------------------------------------------------------------------------------------------------------  RADIOLOGY:  No results found.  ASSESSMENT AND PLAN:   Active Problems:   Gastroenteritis  This is a 84 year old male admitted for gastroenteritis. 1.  Gastroenteritis: Copious amounts of diarrhea.  GI panel and C. difficile assay negative.  Hydrate with intravenous fluid.  Pt received Abx recently- likely due to that.   Will give imodium. 2.  Urinary tract infection: Recurrent; present on admission.      Ur cx sent, rocephin started. 3.  Acute kidney injury: Mild; hydrate with intravenous fluid.  Avoid nephrotoxic agents. 4.  DVT prophylaxis: Heparin 5.  GI prophylaxis:       All the records are reviewed and case discussed with Care Management/Social Workerr. Management plans discussed with the patient, family and they are in agreement.  CODE STATUS: DNR  TOTAL TIME TAKING CARE OF THIS PATIENT: 40 minutes.   Spoke to his daughter, she is leaning toward hospice care at his facility, if he does not improve much.  POSSIBLE D/C IN 1-2 DAYS, DEPENDING ON CLINICAL CONDITION.   Vaughan Basta M.D on 10/18/2017   Between 7am to 6pm - Pager - [phone]  After 6pm go to [email or web address] - password [email or web address] [facility]  Office  [phone]  CC: Primary care physician; [place] Pink, MD  Note: This dictation was prepared with  Dragon dictation along  with smaller Company secretary. Any transcriptional errors that result from this process are unintentional.

## 2017-10-19 ENCOUNTER — Encounter: Payer: Self-pay | Admitting: Internal Medicine

## 2017-10-19 DIAGNOSIS — K529 Noninfective gastroenteritis and colitis, unspecified: Secondary | ICD-10-CM | POA: Diagnosis not present

## 2017-10-19 LAB — BASIC METABOLIC PANEL
ANION GAP: 7 (ref 5–15)
BUN: 19 mg/dL (ref 6–20)
CHLORIDE: 111 mmol/L (ref 101–111)
CO2: 24 mmol/L (ref 22–32)
Calcium: 7.9 mg/dL — ABNORMAL LOW (ref 8.9–10.3)
Creatinine, Ser: 0.89 mg/dL (ref 0.61–1.24)
Glucose, Bld: 127 mg/dL — ABNORMAL HIGH (ref 65–99)
POTASSIUM: 3.8 mmol/L (ref 3.5–5.1)
SODIUM: 142 mmol/L (ref 135–145)

## 2017-10-19 LAB — CBC
HEMATOCRIT: 30.8 % — AB (ref 40.0–52.0)
HEMOGLOBIN: 10.3 g/dL — AB (ref 13.0–18.0)
MCH: 31.2 pg (ref 26.0–34.0)
MCHC: 33.5 g/dL (ref 32.0–36.0)
MCV: 93.3 fL (ref 80.0–100.0)
Platelets: 187 10*3/uL (ref 150–440)
RBC: 3.3 MIL/uL — AB (ref 4.40–5.90)
RDW: 15 % — AB (ref 11.5–14.5)
WBC: 16.7 10*3/uL — AB (ref 3.8–10.6)

## 2017-10-19 MED ORDER — SACCHAROMYCES BOULARDII 250 MG PO CAPS: 250.0000 mg | ORAL_CAPSULE | Freq: Two times a day (BID) | ORAL | 0 refills | Status: AC

## 2017-10-19 MED ORDER — SACCHAROMYCES BOULARDII 250 MG PO CAPS
250.0000 mg | ORAL_CAPSULE | Freq: Two times a day (BID) | ORAL | Status: DC
  Administered 2017-10-19: 250 mg via ORAL
  Filled 2017-10-19: qty 1

## 2017-10-19 MED ORDER — LOPERAMIDE HCL 2 MG PO CAPS: 2.0000 mg | ORAL_CAPSULE | Freq: Four times a day (QID) | ORAL | 0 refills | Status: AC | PRN

## 2017-10-19 NOTE — NC FL2 (Signed)
[place] LEVEL OF CARE SCREENING TOOL     IDENTIFICATION  Patient Name: Victor Parks Birthdate: 1923-09-09 Sex: male Admission Date (Current Location): 10/18/2017  [place] and [place] Number:  Engineering geologist and Address:  [facility], [address], [place], [place] [address]      Provider Number: [id number]  Attending Physician Name and Address:  [name], MD  Relative Name and Phone Number:       Current Level of Care: Hospital Recommended Level of Care: [place] Prior Approval Number:    Date Approved/Denied:   PASRR Number:    Discharge Plan: Domiciliary (Rest home)([facility])    Current Diagnoses: Patient Active Problem List   Diagnosis Date Noted  . Gastroenteritis 10/18/2017  . UGIB (upper gastrointestinal bleed)   . GI bleed 09/14/2017  . HTN (hypertension) 09/14/2017  . Closed left hip fracture ([place]) 07/07/2017  . Hypotension 07/07/2017  . 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 Closed displaced intertrochanteric fracture of left femur ([place])   . Severe sepsis with septic shock ([place]) 06/17/2016  . Acute blood loss anemia 06/17/2016  . Septic shock ([place]) 06/16/2016  . Protein-calorie malnutrition, severe 06/15/2016  . Abdominal pain   . Dyspnea 06/13/2016  . Pressure injury of skin 06/13/2016    Orientation RESPIRATION BLADDER Height & Weight     Self, Place, Situation  Normal Continent Weight: 128 lb (58.1 kg) Height:  5\' 10"  (177.8 cm)  BEHAVIORAL SYMPTOMS/MOOD NEUROLOGICAL BOWEL NUTRITION STATUS      Continent Diet(Clear Liquid diet)  AMBULATORY STATUS COMMUNICATION OF NEEDS Skin   Limited Assist Verbally Normal                       Personal Care Assistance Level of Assistance  Bathing, Feeding, Dressing Bathing Assistance: Limited assistance Feeding assistance: Limited assistance Dressing Assistance: Limited assistance     Functional Limitations Info  Sight,  Hearing, Speech Sight Info: Adequate Hearing Info: Adequate Speech Info: Adequate    SPECIAL CARE FACTORS FREQUENCY                       Contractures Contractures Info: Not present    Additional Factors Info  Code Status, Allergies Code Status Info: DNR Allergies Info: CIPROFLOXACIN           Current Medications (10/19/2017):  This is the current hospital active medication list Current Facility-Administered Medications  Medication Dose Route Frequency Provider Last Rate Last Dose  . acetaminophen (TYLENOL) tablet 650 mg  650 mg Oral Q6H PRN Harrie Foreman, MD       Or  . acetaminophen (TYLENOL) suppository 650 mg  650 mg Rectal Q6H PRN Harrie Foreman, MD      . calcium-vitamin D (OSCAL WITH D) 500-200 MG-UNIT per tablet 2 tablet  2 tablet Oral Daily Harrie Foreman, MD   2 tablet at 10/19/17 [phone]  . cefTRIAXone (ROCEPHIN) 1 g in sodium chloride 0.9 % 100 mL IVPB  1 g Intravenous Q24H Vaughan Basta, MD   Stopped at 10/19/17 0009  . ferrous sulfate tablet 325 mg  325 mg Oral Q breakfast Harrie Foreman, MD   325 mg at 10/19/17 0807  . heparin injection 5,000 Units  5,000 Units Subcutaneous Q8H Harrie Foreman, MD   5,000 Units at 10/19/17 [phone]  . HYDROcodone-acetaminophen (NORCO/VICODIN) 5-325 MG per tablet 1 tablet  1 tablet Oral Q6H PRN Harrie Foreman, MD      .  loperamide (IMODIUM) capsule 2 mg  2 mg Oral Q6H PRN Vaughan Basta, MD      . Menthol-Zinc Oxide 4.00-86.7 % OINT 1 application  1 application Apply externally Q12H PRN Harrie Foreman, MD      . ondansetron [facility]) tablet 4 mg  4 mg Oral Q6H PRN Harrie Foreman, MD       Or  . ondansetron [facility]) injection 4 mg  4 mg Intravenous Q6H PRN Harrie Foreman, MD      . pantoprazole (PROTONIX) EC tablet 40 mg  40 mg Oral BID Harrie Foreman, MD   40 mg at 10/19/17 6195  . potassium citrate (UROCIT-K) SR tablet 10 mEq  10 mEq Oral BID Harrie Foreman, MD   10 mEq at  10/19/17 0810  . saccharomyces boulardii (FLORASTOR) capsule 250 mg  250 mg Oral BID Sudini, Alveta Heimlich, MD      . simethicone [facility]) chewable tablet 80 mg  80 mg Oral QID Harrie Foreman, MD   80 mg at 10/19/17 0932  . sodium chloride 0.9 % bolus 1,000 mL  1,000 mL Intravenous Once Gregor Hams, MD         Discharge Medications: Medication List    STOP taking these medications   cephALEXin 500 MG capsule Commonly known as:  KEFLEX   docusate sodium 100 MG capsule Commonly known as:  COLACE     TAKE these medications   acetaminophen 500 MG tablet Commonly known as:  TYLENOL Take 1,000 mg by mouth 3 (three) times daily. 8 am, 2 pm, 8 pm   CALCIUM 1000 + D 1000-800 MG-UNIT Tabs Generic drug:  Calcium Carb-Cholecalciferol Take 1 tablet by mouth daily.   CALMOSEPTINE 0.44-20.6 % Oint Generic drug:  Menthol-Zinc Oxide Apply 1 application topically every 12 (twelve) hours as needed. To buttocks for rash   DAILY VITE PO Take 1 tablet by mouth daily.   ferrous sulfate 325 (65 FE) MG tablet Take 325 mg by mouth daily with breakfast.   HYDROcodone-acetaminophen 5-325 MG tablet Commonly known as:  NORCO/VICODIN Take 1 tablet by mouth every 6 (six) hours as needed for moderate pain.   loperamide 2 MG capsule Commonly known as:  IMODIUM Take 1 capsule (2 mg total) by mouth 4 (four) times daily as needed for diarrhea or loose stools.   pantoprazole 40 MG tablet Commonly known as:  PROTONIX Take 1 tablet (40 mg total) by mouth 2 (two) times daily.   potassium citrate 10 MEQ (1080 MG) SR tablet Commonly known as:  UROCIT-K Take 10 mEq by mouth 2 (two) times daily.   saccharomyces boulardii 250 MG capsule Commonly known as:  FLORASTOR Take 1 capsule (250 mg total) by mouth 2 (two) times daily.   simethicone 80 MG chewable tablet Commonly known as:  MYLICON Chew 80 mg by mouth 4 (four) times daily.   simethicone 80 MG chewable tablet Commonly known  as:  MYLICON Chew 80 mg by mouth every 6 (six) hours as needed for flatulence.   traMADol 50 MG tablet Commonly known as:  ULTRAM Take 50 mg by mouth 3 (three) times daily. What changed:  Another medication with the same name was changed. Make sure you understand how and when to take each.   traMADol 50 MG tablet Commonly known as:  ULTRAM Take 1 tablet (50 mg total) by mouth 3 (three) times daily as needed. What changed:  reasons to take this  Relevant Imaging Results:  Relevant Lab Results:   Additional Information SSN [id number]  Zettie Pho, LCSW

## 2017-10-19 NOTE — Progress Notes (Signed)
Patient discharge at 89 via EMS to [facility].

## 2017-10-19 NOTE — Clinical Social Work Note (Signed)
The patient will discharge today via non-emergent EMS. The facility and family are aware and in agreement. The CSW will deliver the prepared discharge packet when able. CSW is signing off. Please consult should additional needs arise.  Santiago Bumpers, MSW, Latanya Presser [phone]

## 2017-10-19 NOTE — Discharge Instructions (Signed)
Resume diet and activity as before ° ° °

## 2017-10-19 NOTE — Discharge Summary (Signed)
[place] at [place] NAME: Victor Parks    MR#:  [id number]  DATE OF BIRTH:  [birth year]  DATE OF ADMISSION:  10/18/2017 ADMITTING PHYSICIAN: Harrie Foreman, MD  DATE OF DISCHARGE: 10/19/2017  PRIMARY CARE PHYSICIAN: Maryland Pink, MD   ADMISSION DIAGNOSIS:  Dehydration [E86.0] Diarrhea of infectious origin [A09]  DISCHARGE DIAGNOSIS:  Active Problems:   Gastroenteritis   SECONDARY DIAGNOSIS:   Past Medical History:  Diagnosis Date  . Bladder cancer ([place])   . Decubitus ulcer   . Hypertension   . Muscle weakness   . UTI (urinary tract infection)      ADMITTING HISTORY  HPI: Patient with past medical history of dementia, hypertension, bladder cancer and recurrent urinary tract infection presents to the emergency department from his nursing home with apparent weakness.  Patient cannot contribute much to his history but he denies pain.  The patient reportedly had not been acting himself and had developed diarrhea.  In the emergency department laboratory evaluation revealed leukocytosis as well as acute kidney injury which prompted the emergency department staff to call the hospitalist service for admission.    HOSPITAL COURSE:   *Gastroenteritis. C. difficile and stool PCR negative.  No diarrhea over the last 12 hours. Patient has been on antibiotics recently.  We will put him on Imodium as needed and probiotics.  Prescriptions given.  *Dehydration due to gastroenteritis has resolved with IV fluids. Patient has good appetite.  Encouraged to drink plenty of fluids.  Palliative care to follow patient at the assisted living facility.  Home health.  CONSULTS OBTAINED:    DRUG ALLERGIES:   Allergies  Allergen Reactions  . Ciprofloxacin Anaphylaxis    DISCHARGE MEDICATIONS:   Allergies as of 10/19/2017      Reactions   Ciprofloxacin Anaphylaxis      Medication List    STOP taking these medications   cephALEXin  500 MG capsule Commonly known as:  KEFLEX   docusate sodium 100 MG capsule Commonly known as:  COLACE     TAKE these medications   acetaminophen 500 MG tablet Commonly known as:  TYLENOL Take 1,000 mg by mouth 3 (three) times daily. 8 am, 2 pm, 8 pm   CALCIUM 1000 + D 1000-800 MG-UNIT Tabs Generic drug:  Calcium Carb-Cholecalciferol Take 1 tablet by mouth daily.   CALMOSEPTINE 0.44-20.6 % Oint Generic drug:  Menthol-Zinc Oxide Apply 1 application topically every 12 (twelve) hours as needed. To buttocks for rash   DAILY VITE PO Take 1 tablet by mouth daily.   ferrous sulfate 325 (65 FE) MG tablet Take 325 mg by mouth daily with breakfast.   HYDROcodone-acetaminophen 5-325 MG tablet Commonly known as:  NORCO/VICODIN Take 1 tablet by mouth every 6 (six) hours as needed for moderate pain.   loperamide 2 MG capsule Commonly known as:  IMODIUM Take 1 capsule (2 mg total) by mouth 4 (four) times daily as needed for diarrhea or loose stools.   pantoprazole 40 MG tablet Commonly known as:  PROTONIX Take 1 tablet (40 mg total) by mouth 2 (two) times daily.   potassium citrate 10 MEQ (1080 MG) SR tablet Commonly known as:  UROCIT-K Take 10 mEq by mouth 2 (two) times daily.   saccharomyces boulardii 250 MG capsule Commonly known as:  FLORASTOR Take 1 capsule (250 mg total) by mouth 2 (two) times daily.   simethicone 80 MG chewable tablet Commonly known as:  MYLICON Chew 80 mg by  mouth 4 (four) times daily.   simethicone 80 MG chewable tablet Commonly known as:  MYLICON Chew 80 mg by mouth every 6 (six) hours as needed for flatulence.   traMADol 50 MG tablet Commonly known as:  ULTRAM Take 50 mg by mouth 3 (three) times daily. What changed:  Another medication with the same name was changed. Make sure you understand how and when to take each.   traMADol 50 MG tablet Commonly known as:  ULTRAM Take 1 tablet (50 mg total) by mouth 3 (three) times daily as needed. What  changed:  reasons to take this       Today   VITAL SIGNS:  Blood pressure (!) 110/48, pulse 73, temperature 97.6 F (36.4 C), temperature source Oral, resp. rate 18, height 5\' 10"  (1.778 m), weight 58.1 kg (128 lb), SpO2 97 %.  I/O:    Intake/Output Summary (Last 24 hours) at 10/19/2017 1600 Last data filed at 10/19/2017 1138 Gross per 24 hour  Intake 2775.33 ml  Output 675 ml  Net 2100.33 ml    PHYSICAL EXAMINATION:  Physical Exam  GENERAL:  84 y.o.-year-old patient lying in the bed with no acute distress.  LUNGS: Normal breath sounds bilaterally, no wheezing, rales,rhonchi or crepitation. No use of accessory muscles of respiration.  CARDIOVASCULAR: S1, S2 normal. No murmurs, rubs, or gallops.  ABDOMEN: Soft, non-tender, non-distended. Bowel sounds present. No organomegaly or mass.  NEUROLOGIC: Moves all 4 extremities. PSYCHIATRIC: The patient is awake and alert. SKIN: No obvious rash, lesion, or ulcer.   DATA REVIEW:   CBC Recent Labs  Lab 10/19/17 0507  WBC 16.7*  HGB 10.3*  HCT 30.8*  PLT 187    Chemistries  Recent Labs  Lab 10/18/17 0156 10/19/17 0507  NA 139 142  K 3.6 3.8  CL 105 111  CO2 23 24  GLUCOSE 160* 127*  BUN 25* 19  CREATININE 1.27* 0.89  CALCIUM 8.7* 7.9*  AST 22  --   ALT 10*  --   ALKPHOS 75  --   BILITOT 0.4  --     Cardiac Enzymes Recent Labs  Lab 10/18/17 0156  TROPONINI <0.03    Microbiology Results  Results for orders placed or performed during the hospital encounter of 10/18/17  Gastrointestinal Panel by PCR , Stool     Status: None   Collection Time: 10/18/17  4:37 AM  Result Value Ref Range Status   Campylobacter species NOT DETECTED NOT DETECTED Final   Plesimonas shigelloides NOT DETECTED NOT DETECTED Final   Salmonella species NOT DETECTED NOT DETECTED Final   Yersinia enterocolitica NOT DETECTED NOT DETECTED Final   Vibrio species NOT DETECTED NOT DETECTED Final   Vibrio cholerae NOT DETECTED NOT DETECTED  Final   Enteroaggregative E coli (EAEC) NOT DETECTED NOT DETECTED Final   Enteropathogenic E coli (EPEC) NOT DETECTED NOT DETECTED Final   Enterotoxigenic E coli (ETEC) NOT DETECTED NOT DETECTED Final   Shiga like toxin producing E coli (STEC) NOT DETECTED NOT DETECTED Final   Shigella/Enteroinvasive E coli (EIEC) NOT DETECTED NOT DETECTED Final   Cryptosporidium NOT DETECTED NOT DETECTED Final   Cyclospora cayetanensis NOT DETECTED NOT DETECTED Final   Entamoeba histolytica NOT DETECTED NOT DETECTED Final   Giardia lamblia NOT DETECTED NOT DETECTED Final   Adenovirus F40/41 NOT DETECTED NOT DETECTED Final   Astrovirus NOT DETECTED NOT DETECTED Final   Norovirus GI/GII NOT DETECTED NOT DETECTED Final   Rotavirus A NOT DETECTED NOT DETECTED Final  Sapovirus (I, II, IV, and V) NOT DETECTED NOT DETECTED Final    Comment: Performed at [facility], [address] [place], [place] [address]  C difficile quick scan w PCR reflex     Status: None   Collection Time: 10/18/17  4:37 AM  Result Value Ref Range Status   C Diff antigen NEGATIVE NEGATIVE Final   C Diff toxin NEGATIVE NEGATIVE Final   C Diff interpretation No C. difficile detected.  Final    Comment: Performed at [facility], [address] [place], [place] [address]    RADIOLOGY:  No results found.  Follow up with PCP in 1 week.  Management plans discussed with the patient, family and they are in agreement.  CODE STATUS:     Code Status Orders  (From admission, onward)        Start     Ordered   10/18/17 0606  Do not attempt resuscitation (DNR)  Continuous    Question Answer Comment  In the event of cardiac or respiratory ARREST Do not call a "code blue"   In the event of cardiac or respiratory ARREST Do not perform Intubation, CPR, defibrillation or ACLS   In the event of cardiac or respiratory ARREST Use medication by any route, position, wound care, and other measures to relive pain and  suffering. May use oxygen, suction and manual treatment of airway obstruction as needed for comfort.      10/18/17 0605    Code Status History    Date Active Date Inactive Code Status Order ID Comments User Context   10/09/2017 1536 10/11/2017 2138 DNR [id number]  Saundra Shelling, MD Inpatient   09/14/2017 1507 09/16/2017 1601 DNR [id number]  Idelle Crouch, MD Inpatient   07/07/2017 1335 07/10/2017 1309 DNR [id number]  Idelle Crouch, MD Inpatient   07/07/2017 1329 07/07/2017 1335 Full Code [id number]  Idelle Crouch, MD Inpatient   06/13/2016 0531 06/22/2016 1514 DNR [id number]  Saundra Shelling, MD Inpatient   05/29/2016 0818 05/29/2016 1701 DNR [id number]  Merlyn Lot, MD ED    Advance Directive Documentation     Most Recent Value  Type of Advance Directive  Healthcare Power of , Out of facility DNR (pink MOST or yellow form)  Pre-existing out of facility DNR order (yellow form or pink MOST form)  Yellow form placed in chart (order not valid for inpatient use)  "MOST" Form in Place?  -      TOTAL TIME TAKING CARE OF THIS PATIENT ON DAY OF DISCHARGE: more than 30 minutes.   Leia Alf Shonika Kolasinski M.D on 10/19/2017 at 4:00 PM  Between 7am to 6pm - Pager - [phone]  After 6pm go to [email or web address] - password [email or web address] [facility]  Office  [phone]  CC: Primary care physician; [place] Pink, MD  Note: This dictation was prepared with Dragon dictation along with smaller phrase technology. Any transcriptional errors that result from this process are unintentional.

## 2017-10-21 LAB — URINE CULTURE

## 2017-10-31 IMAGING — CT CT HEAD W/O CM
3 series · 16 of 47 positions shown, 19 images · non-contrast
Comparison: None.

CLINICAL DATA: Found down unconscious today. Possible syncope.
Possibly hit head.

EXAM:
CT HEAD WITHOUT CONTRAST
TECHNIQUE: Contiguous axial images were obtained from the base of the skull
through the vertex without intravenous contrast.

[Series 2: head wo · axial · 0.43mm/px · z∈[+478,+608]mm · 10 of 32 slices shown, 13 images]
[im 3/32  brain]
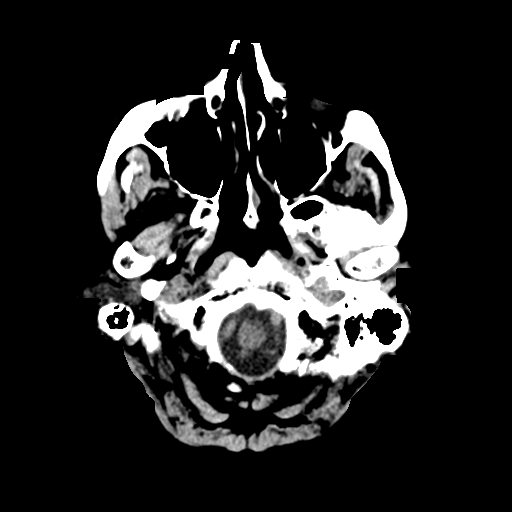
[im 3/32  bone]
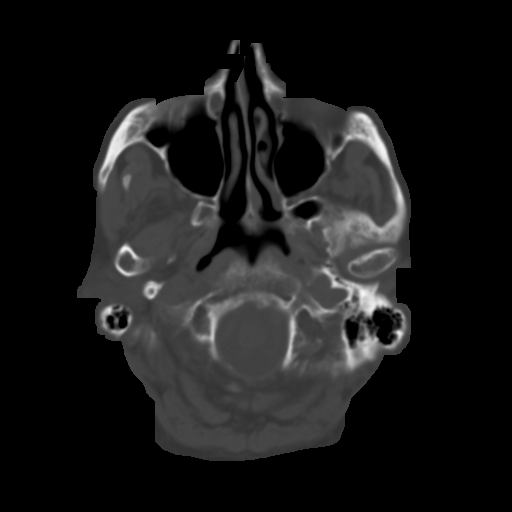
[im 6/32  brain]
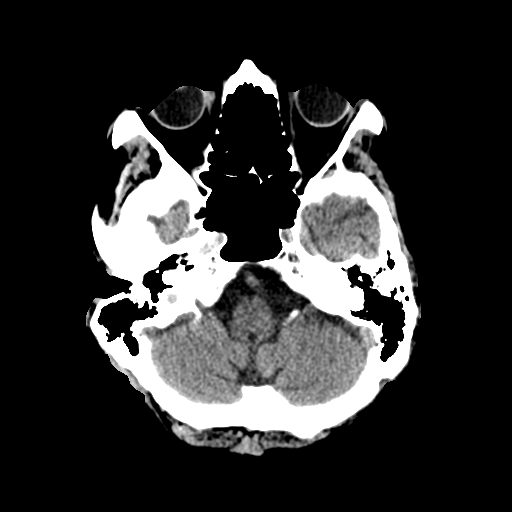
[im 9/32  brain]
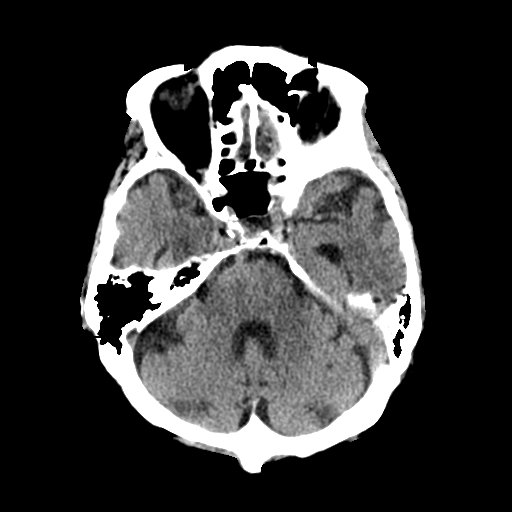
[im 11/32  brain]
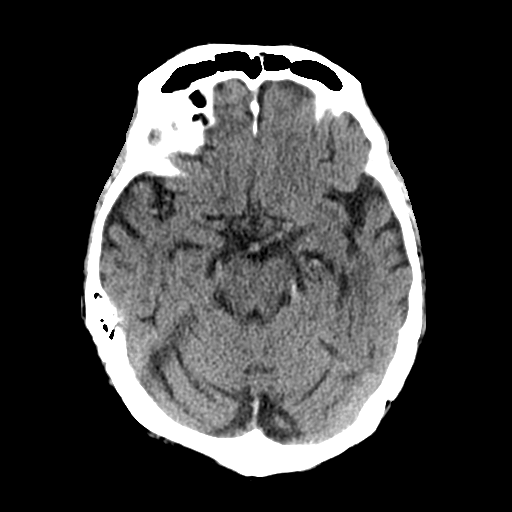
[im 14/32  brain]
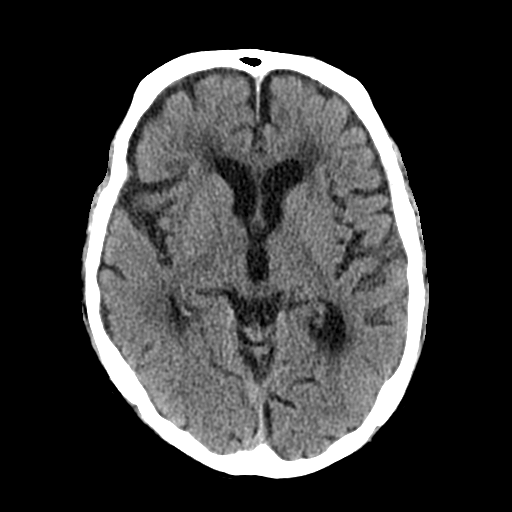
[im 14/32  bone]
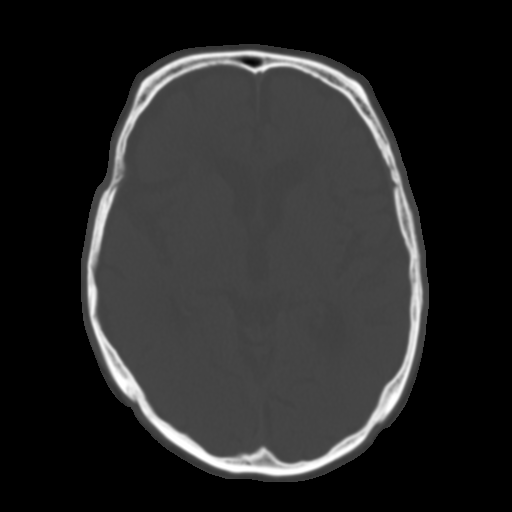
[im 18/32  brain]
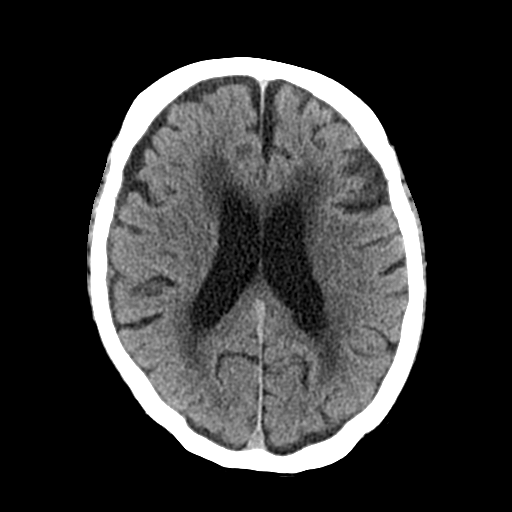
[im 21/32  brain]
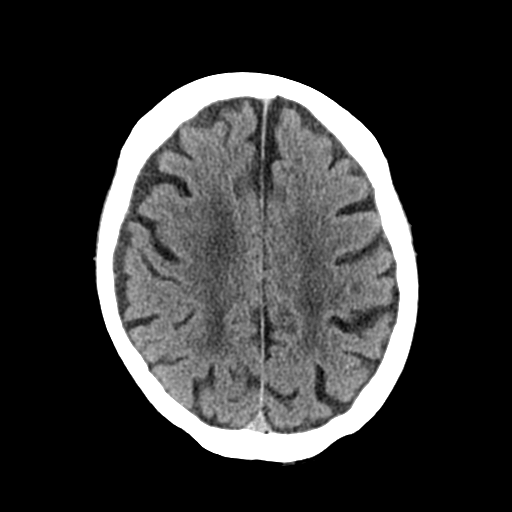
[im 24/32  brain]
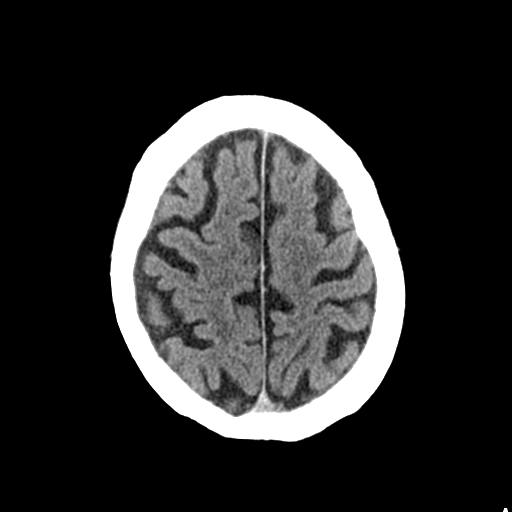
[im 26/32  brain]
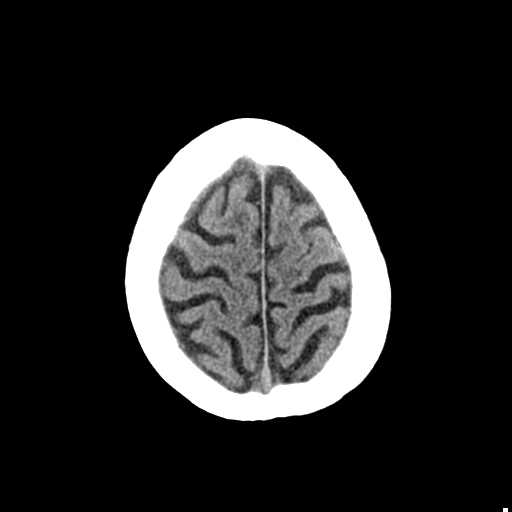
[im 26/32  bone]
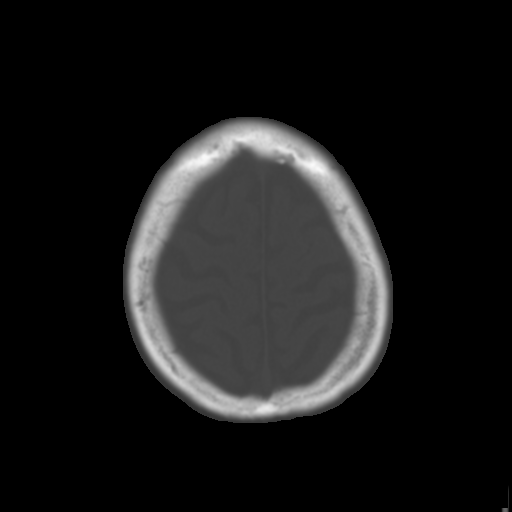
[im 29/32  brain]
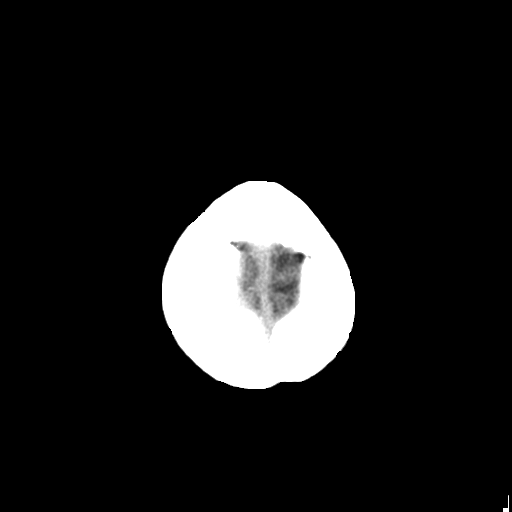

[Series 4: coronal soft tissue · coronal · 0.33mm/px · 3 of 62 slices shown]
[im 21/62  brain]
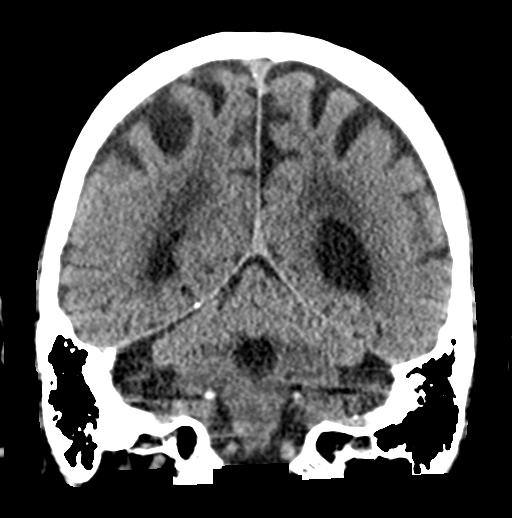
[im 28/62  brain]
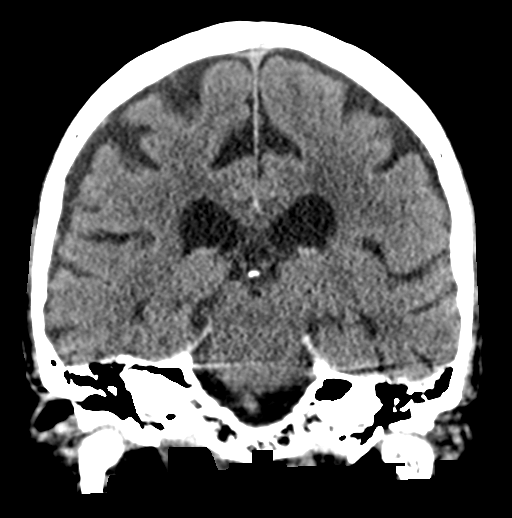
[im 34/62  brain]
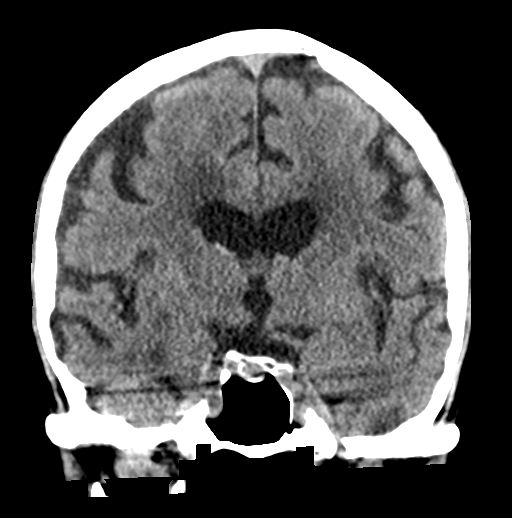

[Series 5: sagittal soft tissue · sagittal · 0.32mm/px · 3 of 54 slices shown]
[im 18/54  brain]
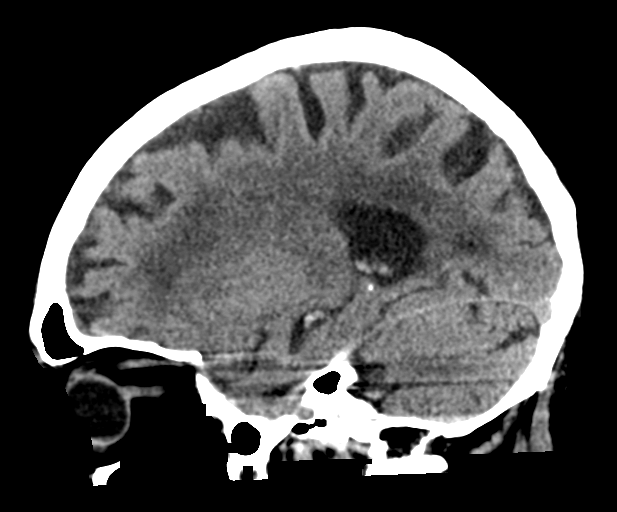
[im 27/54  brain]
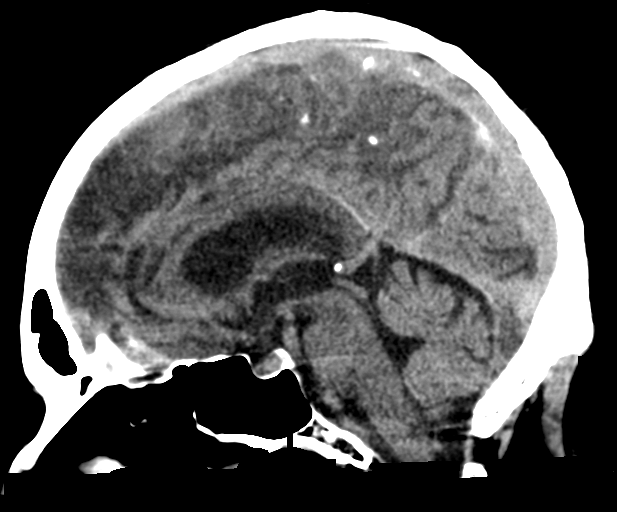
[im 36/54  brain]
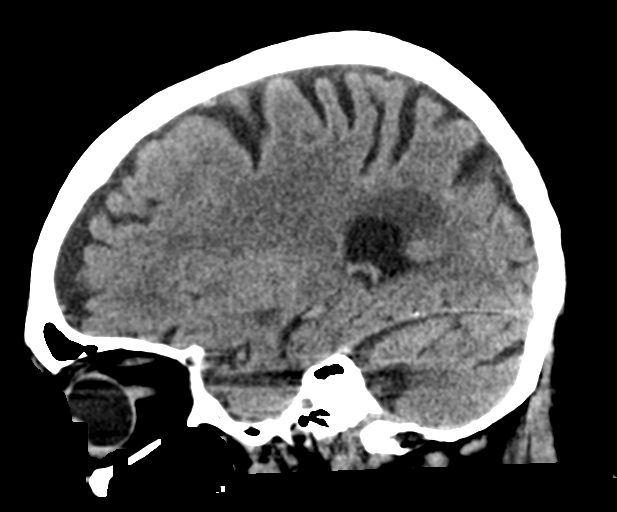

[16 of 47 positions shown; findings below may reference images not displayed]

FINDINGS: Brain: Diffusely enlarged ventricles and subarachnoid spaces. Patchy
white matter low density in both cerebral hemispheres. No
intracranial hemorrhage, mass lesion or CT evidence of acute
infarction.

Vascular: No hyperdense vessel or unexpected calcification.

Skull: Normal. Negative for fracture or focal lesion.

Sinuses/Orbits: Unremarkable.

Other: None.
IMPRESSION: 1. No acute abnormality.
2. Moderate diffuse cerebral and cerebellar atrophy.
3. Extensive chronic small vessel white matter ischemic changes in
both cerebral hemispheres.

## 2017-11-27 DEATH — deceased
